# Patient Record
Sex: Female | Born: 1935 | Race: White | Hispanic: No | Marital: Married | Smoking: Never smoker
Health system: Southern US, Community
[De-identification: ages and names within clinical notes are randomized; demographics above are authoritative.]

## PROBLEM LIST (undated history)

## (undated) DIAGNOSIS — C4491 Basal cell carcinoma of skin, unspecified: Secondary | ICD-10-CM

## (undated) DIAGNOSIS — E785 Hyperlipidemia, unspecified: Secondary | ICD-10-CM

## (undated) DIAGNOSIS — C4492 Squamous cell carcinoma of skin, unspecified: Secondary | ICD-10-CM

## (undated) HISTORY — PX: ORIF TIBIA PLATEAU: SHX2132

## (undated) HISTORY — PX: KNEE SURGERY: SHX244

---
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
## 1962-10-04 DIAGNOSIS — C4491 Basal cell carcinoma of skin, unspecified: Secondary | ICD-10-CM

## 1962-10-04 HISTORY — DX: Basal cell carcinoma of skin, unspecified: C44.91

## 1997-12-07 ENCOUNTER — Other Ambulatory Visit: Admission: RE | Admit: 1997-12-07 | Discharge: 1997-12-07 | Payer: Self-pay | Admitting: Obstetrics & Gynecology

## 1998-12-14 ENCOUNTER — Other Ambulatory Visit: Admission: RE | Admit: 1998-12-14 | Discharge: 1998-12-14 | Payer: Self-pay | Admitting: Obstetrics & Gynecology

## 2000-02-14 ENCOUNTER — Other Ambulatory Visit: Admission: RE | Admit: 2000-02-14 | Discharge: 2000-02-14 | Payer: Self-pay | Admitting: Obstetrics & Gynecology

## 2000-08-11 DIAGNOSIS — C4492 Squamous cell carcinoma of skin, unspecified: Secondary | ICD-10-CM

## 2000-08-11 HISTORY — DX: Squamous cell carcinoma of skin, unspecified: C44.92

## 2001-08-12 ENCOUNTER — Other Ambulatory Visit: Admission: RE | Admit: 2001-08-12 | Discharge: 2001-08-12 | Payer: Self-pay | Admitting: Obstetrics & Gynecology

## 2002-08-24 ENCOUNTER — Other Ambulatory Visit: Admission: RE | Admit: 2002-08-24 | Discharge: 2002-08-24 | Payer: Self-pay | Admitting: Obstetrics & Gynecology

## 2003-08-31 ENCOUNTER — Other Ambulatory Visit: Admission: RE | Admit: 2003-08-31 | Discharge: 2003-08-31 | Payer: Self-pay | Admitting: Obstetrics & Gynecology

## 2011-09-26 ENCOUNTER — Telehealth: Payer: Self-pay

## 2011-09-26 NOTE — Telephone Encounter (Signed)
Pt called to schedule a colonoscopy. Said she is a patient of Dr. Sherril Croon. In the conversation she asked about Dr. Karilyn Cota. He is the one she wanted to schedule with. I gave her his phone number and she said she will call if needed.

## 2011-10-04 ENCOUNTER — Encounter (INDEPENDENT_AMBULATORY_CARE_PROVIDER_SITE_OTHER): Payer: Self-pay | Admitting: *Deleted

## 2011-10-17 ENCOUNTER — Encounter (INDEPENDENT_AMBULATORY_CARE_PROVIDER_SITE_OTHER): Payer: Self-pay | Admitting: *Deleted

## 2011-10-17 ENCOUNTER — Telehealth (INDEPENDENT_AMBULATORY_CARE_PROVIDER_SITE_OTHER): Payer: Self-pay | Admitting: *Deleted

## 2011-10-17 ENCOUNTER — Other Ambulatory Visit (INDEPENDENT_AMBULATORY_CARE_PROVIDER_SITE_OTHER): Payer: Self-pay | Admitting: *Deleted

## 2011-10-17 DIAGNOSIS — Z1211 Encounter for screening for malignant neoplasm of colon: Secondary | ICD-10-CM

## 2011-10-17 MED ORDER — PEG-KCL-NACL-NASULF-NA ASC-C 100 G PO SOLR: 1.0000 | Freq: Once | ORAL | Status: DC

## 2011-10-17 NOTE — Telephone Encounter (Signed)
Patient needs movi prep 

## 2011-11-21 ENCOUNTER — Other Ambulatory Visit: Payer: Self-pay | Admitting: Obstetrics & Gynecology

## 2011-11-21 DIAGNOSIS — R928 Other abnormal and inconclusive findings on diagnostic imaging of breast: Secondary | ICD-10-CM

## 2011-12-02 ENCOUNTER — Inpatient Hospital Stay: Admission: RE | Admit: 2011-12-02 | Payer: Medicare Other | Source: Ambulatory Visit

## 2011-12-04 ENCOUNTER — Ambulatory Visit
Admission: RE | Admit: 2011-12-04 | Discharge: 2011-12-04 | Disposition: A | Discharge status: Home / Self Care | Payer: Medicare Other | Source: Ambulatory Visit | Attending: Obstetrics & Gynecology | Admitting: Obstetrics & Gynecology

## 2011-12-04 DIAGNOSIS — R928 Other abnormal and inconclusive findings on diagnostic imaging of breast: Secondary | ICD-10-CM

## 2011-12-04 IMAGING — MG MM DIGITAL DIAGNOSTIC UNILAT*L*
3 series · 3 of 3 positions shown · non-contrast
Comparison: [DATE] and prior mammograms dating back to [J8].

CLINICAL DATA: 75-year-old female with abnormal screening
mammogram - possible left breast distortion and calcifications.

DIGITAL DIAGNOSTIC LEFT MAMMOGRAM  AND LEFT BREAST ULTRASOUND:

[L CC (1 of 2)]
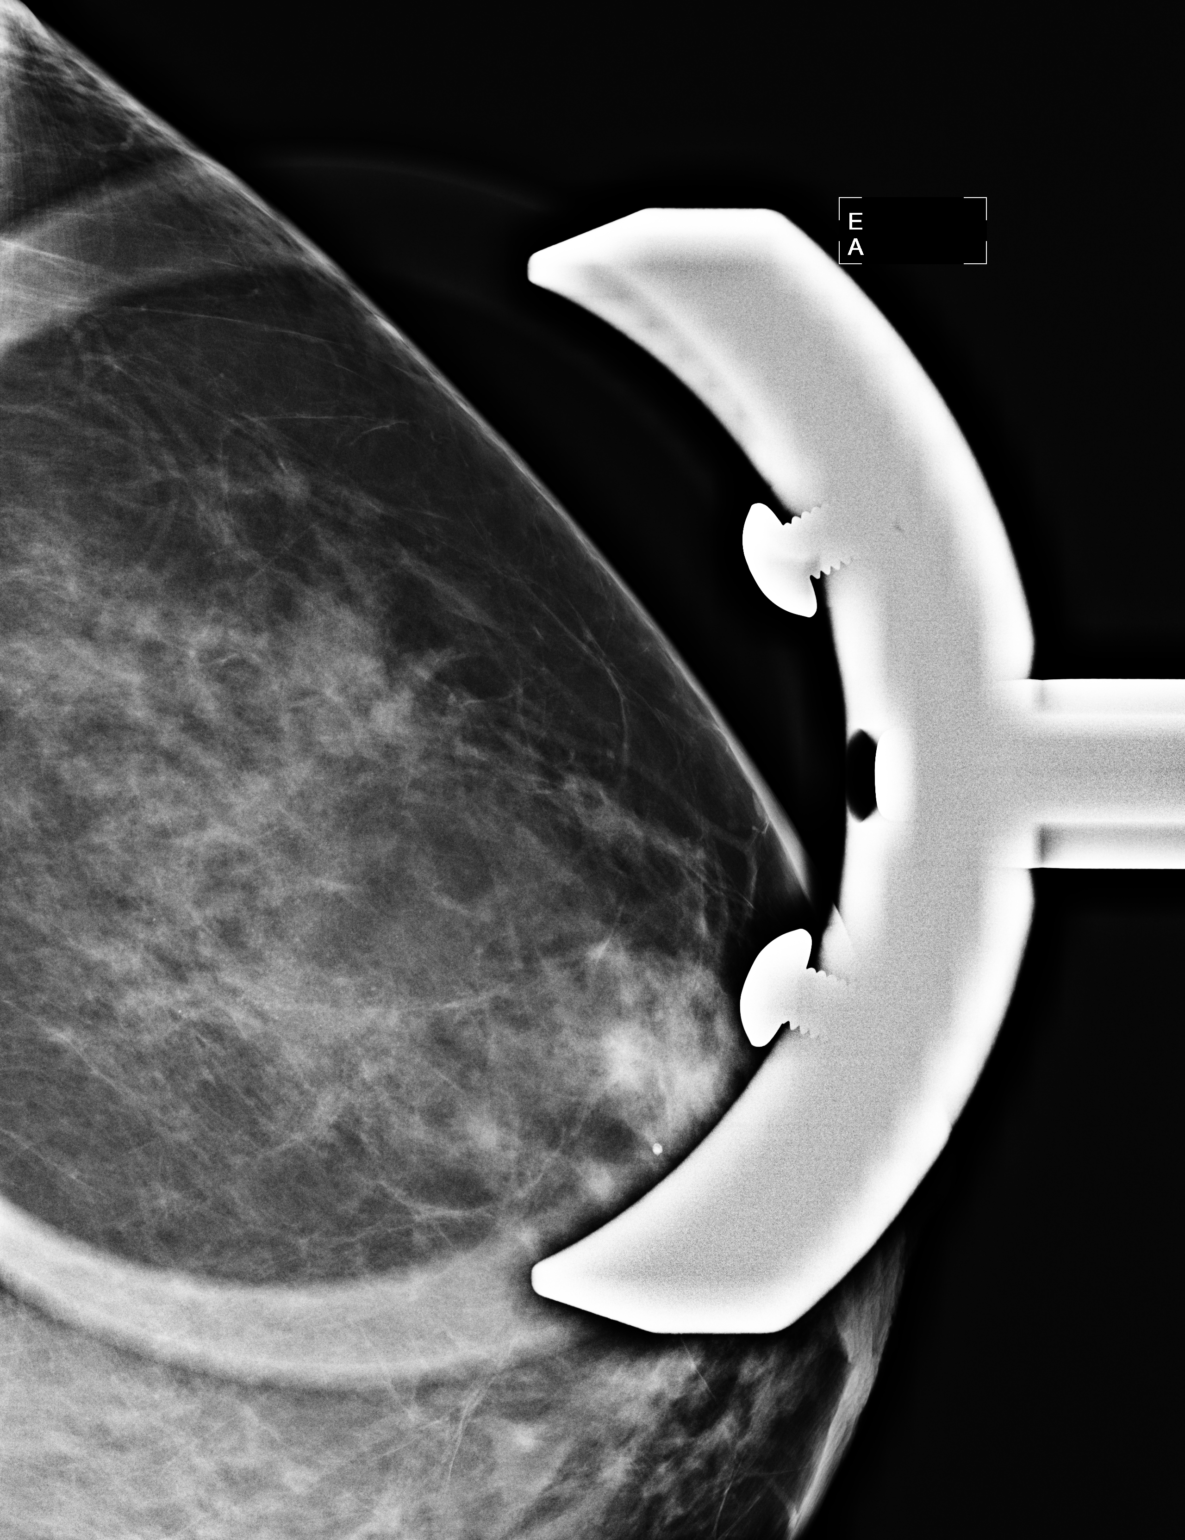

[L ML]
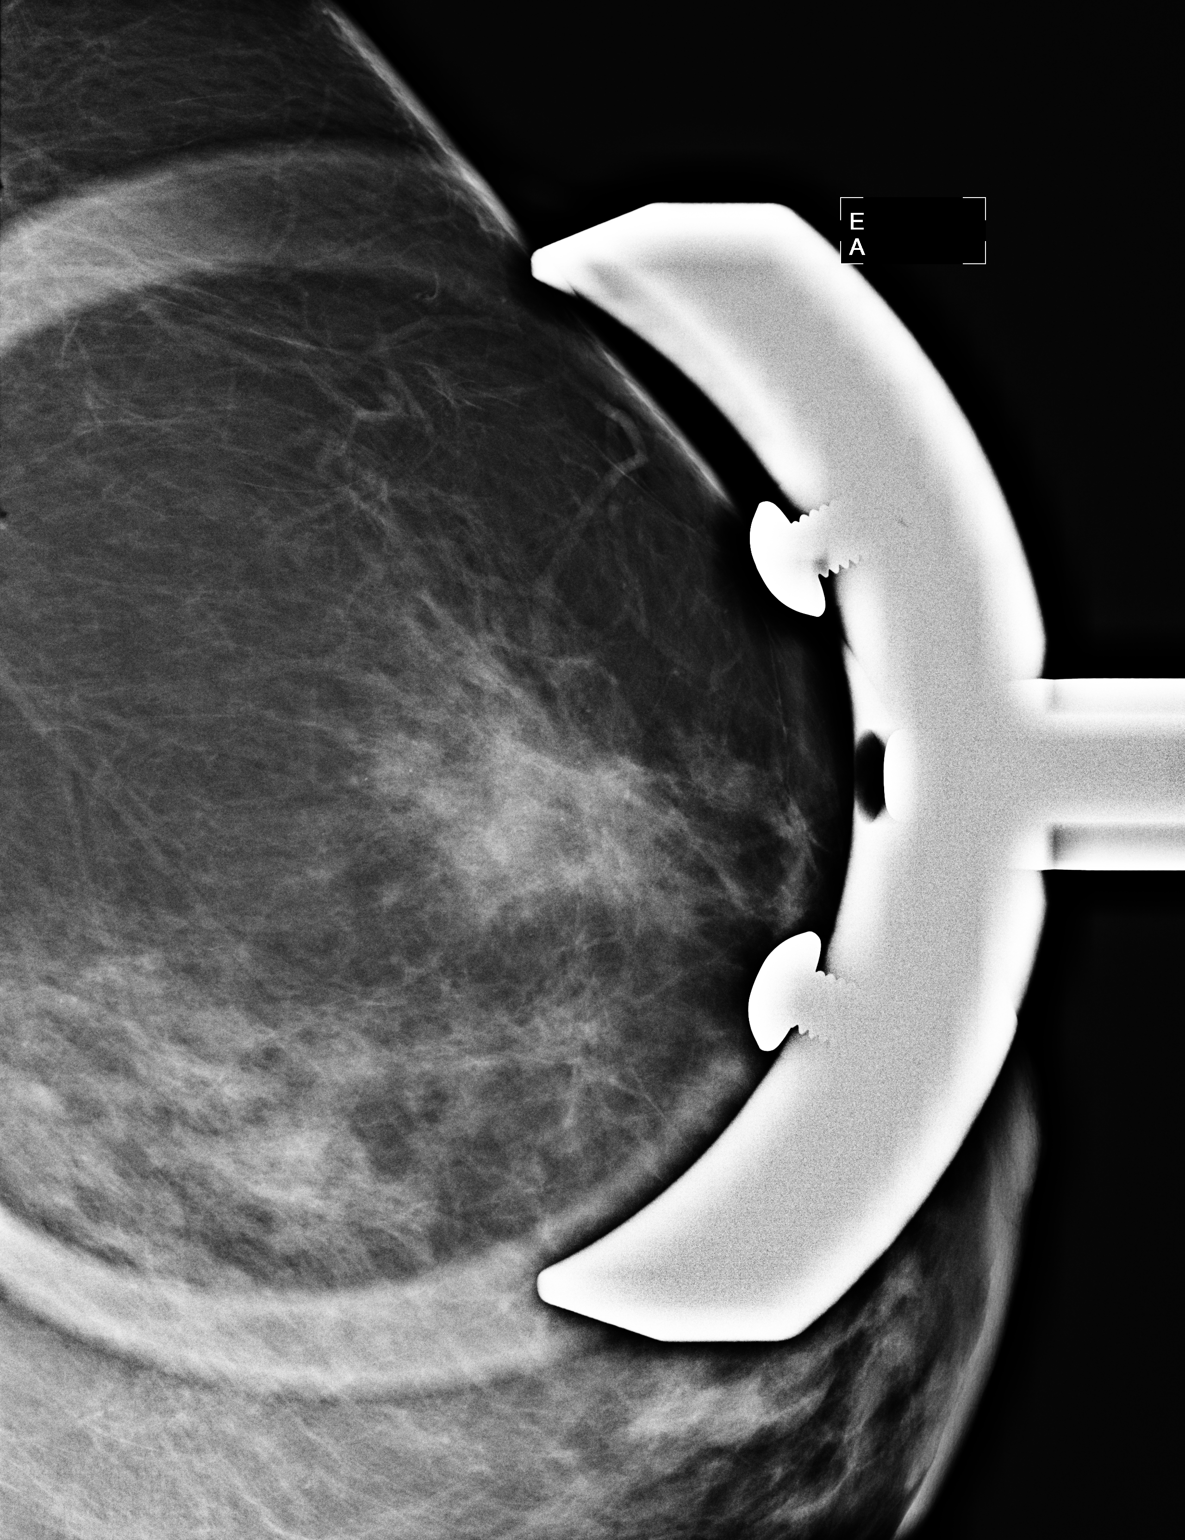

[L CC (2 of 2)]
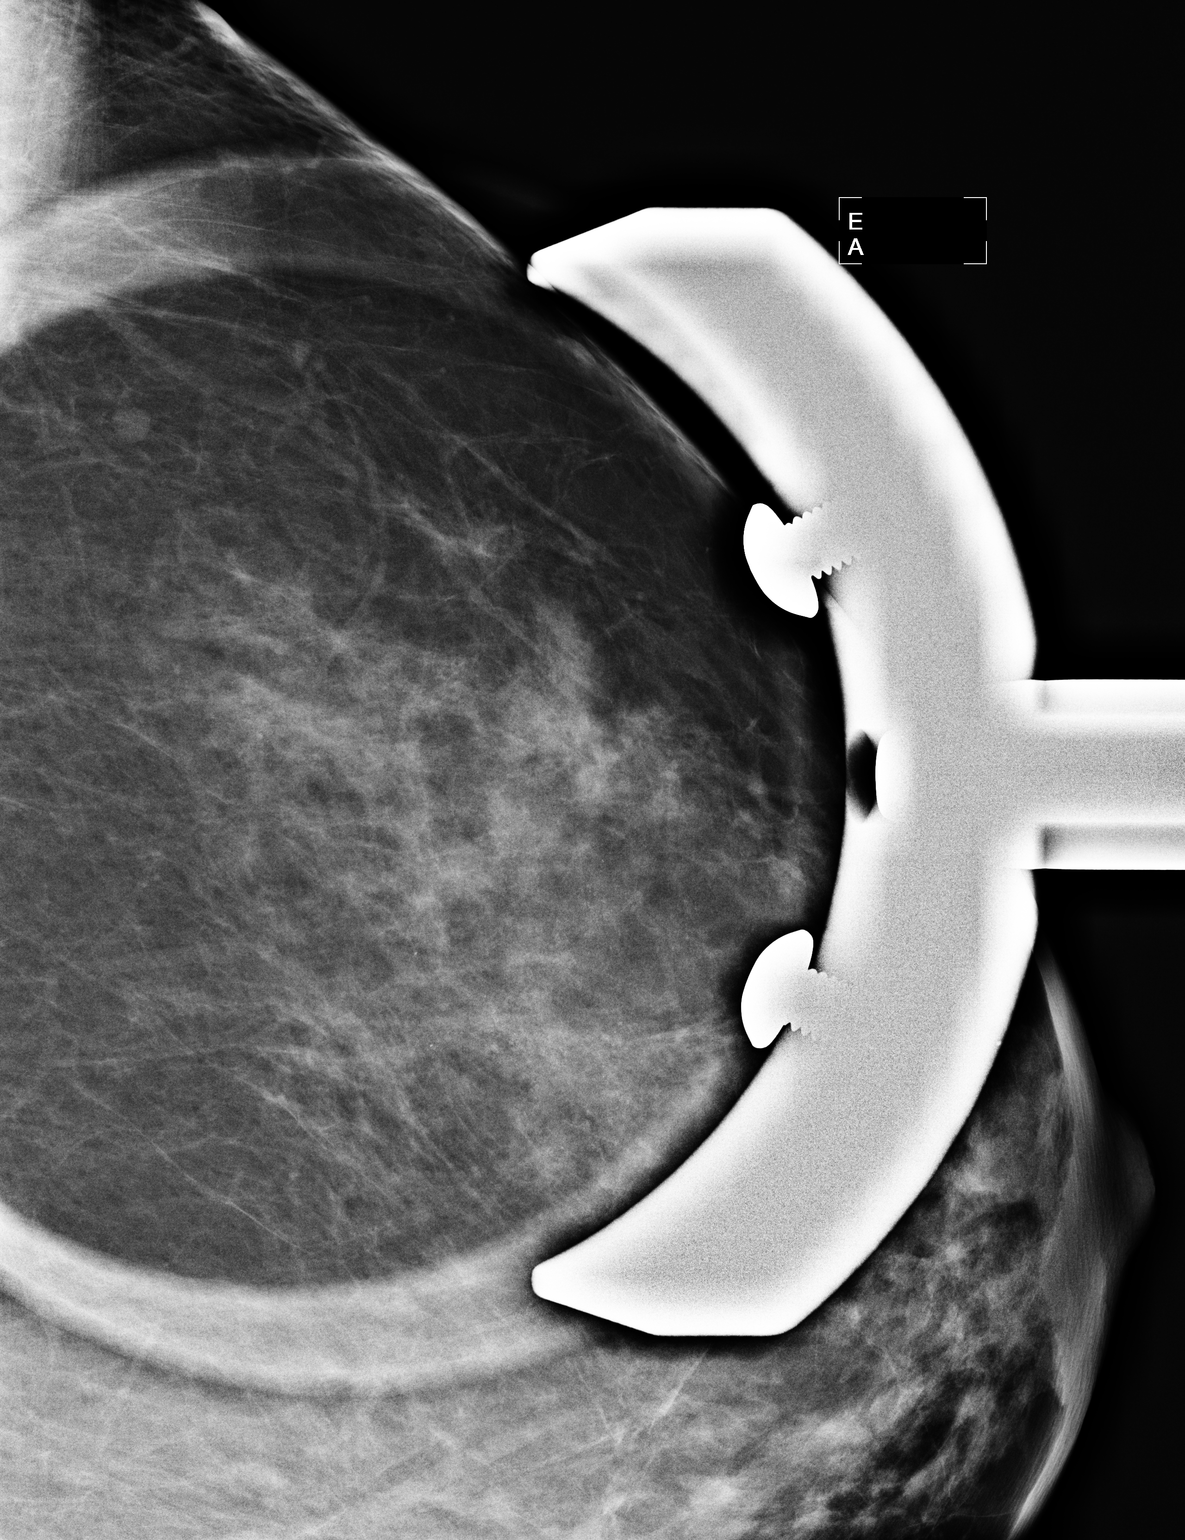

[3 of 3 positions shown; findings below may reference images not displayed]

FINDINGS: CC and MLO magnification views of the upper and outer
left breast demonstrate a focal asymmetric density with scattered
faint calcifications, which on today's study has a similar
appearance to [DATE] images.  There is no evidence of discrete
mass, persistent architectural distortion or new calcifications.

On physical exam, no palpable abnormalities identified in the upper
left breast.

Ultrasound is performed, showing no evidence of solid or cystic
mass, distortion or worrisome shadowing in the upper left breast.
IMPRESSION: Improved appearance of focal asymmetry within the upper left breast
when compared to recent screening study.  This focal asymmetry and
calcifications have a similar appearance to [J8] and compatible
with a benign process.

These findings were discussed with the patient and her questions
answered.  She was encouraged to begin/continue monthly self exams
and to contact her primary physician if any changes noted.

BI-RADS CATEGORY 2:  Benign finding(s).

RECOMMENDATION:
Recommend bilateral screening mammograms in [DATE].

## 2011-12-16 ENCOUNTER — Telehealth (INDEPENDENT_AMBULATORY_CARE_PROVIDER_SITE_OTHER): Payer: Self-pay | Admitting: *Deleted

## 2011-12-16 ENCOUNTER — Encounter (HOSPITAL_COMMUNITY): Payer: Self-pay | Admitting: Pharmacy Technician

## 2011-12-16 NOTE — Telephone Encounter (Signed)
PCP/Requesting MD: vyas  Name & DOB: Shelby Hill [birth year]     Procedure: tcs  Reason/Indication:  screeniing  Has patient had this procedure before?  no  If so, when, by whom and where?    Is there a family history of colon cancer?  no  Who?  What age when diagnosed?    Is patient diabetic?   no      Does patient have prosthetic heart valve?  no  Do you have a pacemaker?  no  Has patient had joint replacement within last 12 months?  no  Is patient on Coumadin, Plavix and/or Aspirin? no  Medications: tylenol prn, advil prn, nasal spray prn  Allergies: nkda  Medication Adjustment:   Procedure date & time: 01/01/12 at 830

## 2011-12-16 NOTE — Telephone Encounter (Signed)
Agree 

## 2011-12-31 MED ORDER — SODIUM CHLORIDE 0.45 % IV SOLN
Freq: Once | INTRAVENOUS | Status: AC
  Administered 2012-01-01: 08:00:00 via INTRAVENOUS

## 2012-01-01 ENCOUNTER — Ambulatory Visit (HOSPITAL_COMMUNITY)
Admission: RE | Admit: 2012-01-01 | Discharge: 2012-01-01 | Disposition: A | Discharge status: Home / Self Care | Payer: Medicare Other | Source: Ambulatory Visit | Attending: Internal Medicine | Admitting: Internal Medicine

## 2012-01-01 ENCOUNTER — Encounter (HOSPITAL_COMMUNITY): Payer: Self-pay | Admitting: *Deleted

## 2012-01-01 ENCOUNTER — Encounter (HOSPITAL_COMMUNITY)
Admission: RE | Disposition: A | Discharge status: Home / Self Care | Payer: Self-pay | Source: Ambulatory Visit | Attending: Internal Medicine

## 2012-01-01 DIAGNOSIS — K644 Residual hemorrhoidal skin tags: Secondary | ICD-10-CM

## 2012-01-01 DIAGNOSIS — D126 Benign neoplasm of colon, unspecified: Secondary | ICD-10-CM

## 2012-01-01 DIAGNOSIS — K5730 Diverticulosis of large intestine without perforation or abscess without bleeding: Secondary | ICD-10-CM | POA: Insufficient documentation

## 2012-01-01 DIAGNOSIS — Z1211 Encounter for screening for malignant neoplasm of colon: Secondary | ICD-10-CM

## 2012-01-01 DIAGNOSIS — K5732 Diverticulitis of large intestine without perforation or abscess without bleeding: Secondary | ICD-10-CM

## 2012-01-01 HISTORY — PX: COLONOSCOPY: SHX5424

## 2012-01-01 SURGERY — COLONOSCOPY
Anesthesia: Moderate Sedation

## 2012-01-01 MED ORDER — MEPERIDINE HCL 50 MG/ML IJ SOLN
INTRAMUSCULAR | Status: AC
  Filled 2012-01-01: qty 1

## 2012-01-01 MED ORDER — MIDAZOLAM HCL 5 MG/5ML IJ SOLN
INTRAMUSCULAR | Status: DC | PRN
  Administered 2012-01-01 (×5): 2 mg via INTRAVENOUS

## 2012-01-01 MED ORDER — MIDAZOLAM HCL 5 MG/5ML IJ SOLN
INTRAMUSCULAR | Status: AC
  Filled 2012-01-01: qty 10

## 2012-01-01 MED ORDER — STERILE WATER FOR IRRIGATION IR SOLN
Status: DC | PRN
  Administered 2012-01-01: 08:00:00

## 2012-01-01 MED ORDER — METRONIDAZOLE 500 MG PO TABS: 500.0000 mg | ORAL_TABLET | Freq: Three times a day (TID) | ORAL | Status: AC

## 2012-01-01 MED ORDER — MEPERIDINE HCL 50 MG/ML IJ SOLN
INTRAMUSCULAR | Status: DC | PRN
  Administered 2012-01-01 (×2): 25 mg via INTRAVENOUS

## 2012-01-01 MED ORDER — CIPROFLOXACIN HCL 500 MG PO TABS: 500.0000 mg | ORAL_TABLET | Freq: Two times a day (BID) | ORAL | Status: AC

## 2012-01-01 NOTE — H&P (Signed)
Shelby Hill is an 76 y.o. female.   Chief Complaint: Patient is here for colonoscopy. HPI: Patient is 76 year old Caucasian female who is here for screening colonoscopy. This is patient's first exam. She denies abdominal pain change in bowel habits or rectal bleeding. Family history is negative for colorectal carcinoma.  History reviewed. No pertinent past medical history.  Past Surgical History  Procedure Date  . Orif tibia plateau     History reviewed. No pertinent family history. Social History:  reports that she has never smoked. She does not have any smokeless tobacco history on file. She reports that she does not drink alcohol or use illicit drugs.  Allergies:  Allergies  Allergen Reactions  . Codeine Nausea And Vomiting    Medications Prior to Admission  Medication Sig Dispense Refill  . Multiple Vitamin (MULTIVITAMIN WITH MINERALS) TABS Take 1 tablet by mouth daily.      . peg 3350 powder (MOVIPREP) 100 G SOLR Take 1 kit (100 g total) by mouth once.  1 kit  0    No results found for this or any previous visit (from the past 48 hour(s)). No results found.  ROS  Blood pressure 139/78, pulse 93, temperature 97.9 F (36.6 C), temperature source Oral, resp. rate 18, height 5\' 6"  (1.676 m), weight 160 lb (72.576 kg), SpO2 97.00%. Physical Exam  Constitutional: She appears well-developed and well-nourished.  HENT:  Mouth/Throat: Oropharynx is clear and moist.  Eyes: Conjunctivae are normal. No scleral icterus.  Neck: No thyromegaly present.  Cardiovascular: Normal rate, regular rhythm and normal heart sounds.   No murmur heard. Respiratory: Effort normal.  GI: Soft. She exhibits no distension and no mass. There is no tenderness.  Musculoskeletal: She exhibits no edema.  Lymphadenopathy:    She has no cervical adenopathy.  Neurological: She is alert.  Skin: Skin is warm and dry.     Assessment/Plan Average risk screening colonoscopy.  Shelby Hill U 01/01/2012,  8:29 AM

## 2012-01-01 NOTE — Op Note (Signed)
COLONOSCOPY PROCEDURE REPORT  PATIENT:  Shelby Hill  MR#:  [id number] Birthdate:  15-Sep-1935, 76 y.o., female Endoscopist:  Dr. Malissa Hippo, MD Referred By:  Dr. Ignatius Specking, MD Procedure Date: 01/01/2012  Procedure:   Colonoscopy  Indications:  Patient is 76 year old Caucasian female who is undergoing average risk screening colonoscopy. This is patient's first exam.  Informed Consent:  The procedure and risks were reviewed with the patient and informed consent was obtained.  Medications:  Demerol 50 mg IV Versed 10 mg IV  Description of procedure:  After a digital rectal exam was performed, that colonoscope was advanced from the anus through the rectum and colon to the area of the cecum, ileocecal valve and appendiceal orifice. The cecum was deeply intubated. These structures were well-seen and photographed for the record. From the level of the cecum and ileocecal valve, the scope was slowly and cautiously withdrawn. The mucosal surfaces were carefully surveyed utilizing scope tip to flexion to facilitate fold flattening as needed. The scope was pulled down into the rectum where a thorough exam including retroflexion was performed.  Findings:   Prep excellent. Scattered diverticula at sigmoid colon and one with stomal ulceration edema and exudate. Two small polyps ablated via cold biopsy and submitted together(cecum and hepatic flexure). Small polyps from sigmoid colon also ablated via cold biopsy. Small  hemorrhoids below the dentate line.  Therapeutic/Diagnostic Maneuvers Performed:  See above  Complications:  None  Cecal Withdrawal Time:  12 minutes  Impression:  Examination performed to cecum. Sigmoid diverticulosis and one diverticulum with changes of diverticulitis. Three small polyps ablated via cold biopsy. 2 of these are submitted together(common hepatic flexure). Third polyp was at sigmoid colon. Small external hemorrhoids.  Recommendations:  Standard  instructions. Cipro 500 mg by mouth twice a day for 10 days. Metronidazole 500 mg by mouth twice a day for 10 days. I will be contacting patient with biopsy results and further recommendations.  Carlin Mamone U  01/01/2012 9:18 AM  CC: Dr. Ignatius Specking., MD & Dr. Bonnetta Barry ref. provider found

## 2012-01-03 ENCOUNTER — Encounter (HOSPITAL_COMMUNITY): Payer: Self-pay | Admitting: Internal Medicine

## 2012-01-10 ENCOUNTER — Encounter (INDEPENDENT_AMBULATORY_CARE_PROVIDER_SITE_OTHER): Payer: Self-pay | Admitting: *Deleted

## 2012-02-06 ENCOUNTER — Other Ambulatory Visit: Payer: Self-pay | Admitting: Dermatology

## 2012-11-09 ENCOUNTER — Other Ambulatory Visit: Payer: Self-pay | Admitting: Dermatology

## 2013-12-14 ENCOUNTER — Other Ambulatory Visit: Payer: Self-pay | Admitting: Dermatology

## 2014-01-21 ENCOUNTER — Other Ambulatory Visit: Payer: Self-pay

## 2014-01-21 DIAGNOSIS — Z1231 Encounter for screening mammogram for malignant neoplasm of breast: Secondary | ICD-10-CM

## 2014-02-04 ENCOUNTER — Encounter (INDEPENDENT_AMBULATORY_CARE_PROVIDER_SITE_OTHER): Payer: Self-pay

## 2014-02-04 ENCOUNTER — Ambulatory Visit
Admission: RE | Admit: 2014-02-04 | Discharge: 2014-02-04 | Disposition: A | Discharge status: Home / Self Care | Payer: 59 | Source: Ambulatory Visit

## 2014-02-04 DIAGNOSIS — Z1231 Encounter for screening mammogram for malignant neoplasm of breast: Secondary | ICD-10-CM

## 2014-08-04 ENCOUNTER — Other Ambulatory Visit: Payer: Self-pay | Admitting: Dermatology

## 2015-08-09 ENCOUNTER — Other Ambulatory Visit: Payer: Self-pay | Admitting: Dermatology

## 2016-01-09 ENCOUNTER — Other Ambulatory Visit: Payer: Self-pay | Admitting: Internal Medicine

## 2016-01-09 DIAGNOSIS — Z1231 Encounter for screening mammogram for malignant neoplasm of breast: Secondary | ICD-10-CM

## 2016-01-15 ENCOUNTER — Ambulatory Visit
Admission: RE | Admit: 2016-01-15 | Discharge: 2016-01-15 | Disposition: A | Discharge status: Home / Self Care | Payer: Medicare Other | Source: Ambulatory Visit | Attending: Internal Medicine | Admitting: Internal Medicine

## 2016-01-15 ENCOUNTER — Ambulatory Visit: Payer: Self-pay

## 2016-01-15 DIAGNOSIS — Z1231 Encounter for screening mammogram for malignant neoplasm of breast: Secondary | ICD-10-CM

## 2016-01-17 ENCOUNTER — Ambulatory Visit: Payer: Self-pay

## 2016-02-28 ENCOUNTER — Other Ambulatory Visit: Payer: Self-pay | Admitting: Dermatology

## 2016-07-01 ENCOUNTER — Other Ambulatory Visit: Payer: Self-pay | Admitting: Dermatology

## 2017-02-27 ENCOUNTER — Other Ambulatory Visit: Payer: Self-pay | Admitting: Internal Medicine

## 2017-02-27 DIAGNOSIS — Z1231 Encounter for screening mammogram for malignant neoplasm of breast: Secondary | ICD-10-CM

## 2017-03-11 ENCOUNTER — Ambulatory Visit
Admission: RE | Admit: 2017-03-11 | Discharge: 2017-03-11 | Disposition: A | Discharge status: Home / Self Care | Payer: Medicare Other | Source: Ambulatory Visit | Attending: Internal Medicine | Admitting: Internal Medicine

## 2017-03-11 DIAGNOSIS — Z1231 Encounter for screening mammogram for malignant neoplasm of breast: Secondary | ICD-10-CM

## 2017-04-25 ENCOUNTER — Encounter (HOSPITAL_COMMUNITY): Payer: Self-pay | Admitting: Internal Medicine

## 2017-04-25 ENCOUNTER — Inpatient Hospital Stay (HOSPITAL_COMMUNITY)
Admission: AD | Admit: 2017-04-25 | Discharge: 2017-04-29 | DRG: 481 | Disposition: A | Discharge status: Skilled Nursing Facility | Payer: Medicare Other | Source: Other Acute Inpatient Hospital | Attending: Family Medicine | Admitting: Family Medicine

## 2017-04-25 DIAGNOSIS — Z419 Encounter for procedure for purposes other than remedying health state, unspecified: Secondary | ICD-10-CM | POA: Diagnosis not present

## 2017-04-25 DIAGNOSIS — Z79899 Other long term (current) drug therapy: Secondary | ICD-10-CM

## 2017-04-25 DIAGNOSIS — E7800 Pure hypercholesterolemia, unspecified: Secondary | ICD-10-CM | POA: Diagnosis not present

## 2017-04-25 DIAGNOSIS — S72002A Fracture of unspecified part of neck of left femur, initial encounter for closed fracture: Secondary | ICD-10-CM

## 2017-04-25 DIAGNOSIS — E876 Hypokalemia: Secondary | ICD-10-CM | POA: Diagnosis not present

## 2017-04-25 DIAGNOSIS — F419 Anxiety disorder, unspecified: Secondary | ICD-10-CM | POA: Diagnosis present

## 2017-04-25 DIAGNOSIS — S72142A Displaced intertrochanteric fracture of left femur, initial encounter for closed fracture: Secondary | ICD-10-CM | POA: Diagnosis not present

## 2017-04-25 DIAGNOSIS — W010XXA Fall on same level from slipping, tripping and stumbling without subsequent striking against object, initial encounter: Secondary | ICD-10-CM | POA: Diagnosis not present

## 2017-04-25 DIAGNOSIS — D62 Acute posthemorrhagic anemia: Secondary | ICD-10-CM | POA: Diagnosis not present

## 2017-04-25 DIAGNOSIS — R262 Difficulty in walking, not elsewhere classified: Secondary | ICD-10-CM | POA: Diagnosis not present

## 2017-04-25 DIAGNOSIS — E785 Hyperlipidemia, unspecified: Secondary | ICD-10-CM | POA: Diagnosis present

## 2017-04-25 DIAGNOSIS — D500 Iron deficiency anemia secondary to blood loss (chronic): Secondary | ICD-10-CM | POA: Diagnosis present

## 2017-04-25 DIAGNOSIS — H00019 Hordeolum externum unspecified eye, unspecified eyelid: Secondary | ICD-10-CM | POA: Diagnosis present

## 2017-04-25 HISTORY — DX: Hyperlipidemia, unspecified: E78.5

## 2017-04-25 LAB — COMPREHENSIVE METABOLIC PANEL
ALK PHOS: 59 U/L (ref 38–126)
ALT: 14 U/L (ref 14–54)
ANION GAP: 5 (ref 5–15)
AST: 18 U/L (ref 15–41)
Albumin: 3.3 g/dL — ABNORMAL LOW (ref 3.5–5.0)
BILIRUBIN TOTAL: 1 mg/dL (ref 0.3–1.2)
BUN: 15 mg/dL (ref 6–20)
CALCIUM: 8.8 mg/dL — AB (ref 8.9–10.3)
CO2: 26 mmol/L (ref 22–32)
CREATININE: 0.68 mg/dL (ref 0.44–1.00)
Chloride: 107 mmol/L (ref 101–111)
Glucose, Bld: 142 mg/dL — ABNORMAL HIGH (ref 65–99)
Potassium: 4 mmol/L (ref 3.5–5.1)
SODIUM: 138 mmol/L (ref 135–145)
TOTAL PROTEIN: 6.1 g/dL — AB (ref 6.5–8.1)

## 2017-04-25 LAB — CBC WITH DIFFERENTIAL/PLATELET
BASOS ABS: 0 10*3/uL (ref 0.0–0.1)
BASOS PCT: 0 %
EOS ABS: 0 10*3/uL (ref 0.0–0.7)
Eosinophils Relative: 0 %
HCT: 35.3 % — ABNORMAL LOW (ref 36.0–46.0)
HEMOGLOBIN: 11.7 g/dL — AB (ref 12.0–15.0)
Lymphocytes Relative: 8 %
Lymphs Abs: 0.8 10*3/uL (ref 0.7–4.0)
MCH: 29.6 pg (ref 26.0–34.0)
MCHC: 33.1 g/dL (ref 30.0–36.0)
MCV: 89.4 fL (ref 78.0–100.0)
MONOS PCT: 6 %
Monocytes Absolute: 0.6 10*3/uL (ref 0.1–1.0)
NEUTROS ABS: 9 10*3/uL — AB (ref 1.7–7.7)
NEUTROS PCT: 86 %
Platelets: 176 10*3/uL (ref 150–400)
RBC: 3.95 MIL/uL (ref 3.87–5.11)
RDW: 13.4 % (ref 11.5–15.5)
WBC: 10.5 10*3/uL (ref 4.0–10.5)

## 2017-04-25 LAB — TYPE AND SCREEN
ABO/RH(D): O POS
ANTIBODY SCREEN: NEGATIVE

## 2017-04-25 LAB — ABO/RH: ABO/RH(D): O POS

## 2017-04-25 MED ORDER — MORPHINE SULFATE (PF) 4 MG/ML IV SOLN
1.0000 mg | INTRAVENOUS | Status: DC | PRN
  Administered 2017-04-25 – 2017-04-26 (×3): 1 mg via INTRAVENOUS
  Filled 2017-04-25 (×3): qty 1

## 2017-04-25 MED ORDER — ZOLPIDEM TARTRATE 5 MG PO TABS
5.0000 mg | ORAL_TABLET | Freq: Once | ORAL | Status: AC
  Administered 2017-04-25: 5 mg via ORAL
  Filled 2017-04-25 (×2): qty 1

## 2017-04-25 MED ORDER — METHOCARBAMOL 1000 MG/10ML IJ SOLN
500.0000 mg | Freq: Four times a day (QID) | INTRAMUSCULAR | Status: DC | PRN
  Filled 2017-04-25: qty 5

## 2017-04-25 MED ORDER — METHOCARBAMOL 500 MG PO TABS
500.0000 mg | ORAL_TABLET | Freq: Four times a day (QID) | ORAL | Status: DC | PRN
  Administered 2017-04-25 – 2017-04-28 (×6): 500 mg via ORAL
  Filled 2017-04-25 (×3): qty 1

## 2017-04-25 MED ORDER — MORPHINE SULFATE (PF) 4 MG/ML IV SOLN
0.5000 mg | INTRAVENOUS | Status: DC | PRN
  Administered 2017-04-25: 0.52 mg via INTRAVENOUS
  Filled 2017-04-25: qty 1

## 2017-04-25 NOTE — Consult Note (Signed)
   ORTHOPAEDIC CONSULTATION  REQUESTING PHYSICIAN: Modena Jansky, MD  Chief Complaint: Left intertroch hip fracture  HPI: Shelby Hill is a 81 y.o. female who presents with left hip fracture s/p mechanical fall PTA.  The patient endorses severe pain in the left hip, that does not radiate, grinding in quality, worse with any movement, better with immobilization.  Denies LOC/fever/chills/nausea/vomiting.  Walks without assistive devices (walker, cane, wheelchair).  Does live independently.  Denies LOC, neck pain, abd pain.  No past medical history on file.  Social History   Socioeconomic History  . Marital status: Married    Spouse name: Not on file  . Number of children: Not on file  . Years of education: Not on file  . Highest education level: Not on file  Social Needs  . Financial resource strain: Not on file  . Food insecurity - worry: Not on file  . Food insecurity - inability: Not on file  . Transportation needs - medical: Not on file  . Transportation needs - non-medical: Not on file  Occupational History  . Not on file  Tobacco Use  . Smoking status: Not on file  Substance and Sexual Activity  . Alcohol use: Not on file  . Drug use: Not on file  . Sexual activity: Not on file  Other Topics Concern  . Not on file  Social History Narrative  . Not on file   No family history on file. No Known Allergies Prior to Admission medications   Medication Sig Start Date End Date Taking? Authorizing Provider  acetaminophen (TYLENOL) 500 MG tablet Take 500 mg by mouth every 6 (six) hours as needed for headache (pain).   Yes [provider]  ALPRAZolam Duanne Moron) 0.5 MG tablet Take 0.25 mg by mouth at bedtime as needed for anxiety or sleep.   Yes [provider]  neomycin-polymyxin-dexameth (MAXITROL) 0.1 % OINT Place 1 application into both eyes See admin instructions. Apply to each eyelid daily at bedtime until sties are gone   Yes [provider]    PRAVASTATIN SODIUM PO Take 1 tablet by mouth daily.   Yes [provider]  PRESCRIPTION MEDICATION Take 1 tablet by mouth 2 (two) times daily. Antibiotic for eye infection - started approximately 04/21/17   Yes [provider]   No results found.  All pertinent xrays, MRI, CT independently reviewed and interpreted  Positive ROS: All other systems have been reviewed and were otherwise negative with the exception of those mentioned in the HPI and as above.  Physical Exam: General: Alert, no acute distress Cardiovascular: No pedal edema Respiratory: No cyanosis, no use of accessory musculature GI: No organomegaly, abdomen is soft and non-tender Skin: No lesions in the area of chief complaint Neurologic: Sensation intact distally Psychiatric: Patient is competent for consent with normal mood and affect Lymphatic: No axillary or cervical lymphadenopathy  MUSCULOSKELETAL:  - pain with movement of the hip and extremity - skin intact - NVI distally - compartments soft  Assessment: Left intertroch hip fracture  Plan: - operative fixation is recommended, patient and family are aware of r/b/a and wish to proceed - consent obtained - medical optimization per primary team - surgery is planned for sat morning - NPO after midnight    Thank you for the consult and the opportunity to see Shelby Hill  N. Eduard Roux, MD [place] 9:14 PM

## 2017-04-25 NOTE — H&P (Signed)
History and Physical    Ceriah Hill Hill DOB: [birth year] DOA: 04/25/2017  PCP: No primary care provider on file.  Patient coming from: Patient was transferred from [facility].  Chief Complaint: Fall.  HPI: Shelby Hill is a 81 y.o. female with history of hyperlipidemia and anxiety had a fall at home after tripping.  Denies hitting her head or losing consciousness.  Denies any chest pain or shortness of breath prior to the fall or after the fall.  ED Course: Patient was taken to the ED in [place] at [facility].  X-rays revealed left intertrochanteric fracture.  Dr. Erlinda Hong on-call orthopedic surgeon at [facility] was consulted and patient transferred over here for surgery.  At the time of my exam patient is not in distress.  I have reviewed patient's labs and x-rays results obtained from [facility].  Review of Systems: As per HPI, rest all negative.   Past Medical History:  Diagnosis Date  . HLD (hyperlipidemia)     Past Surgical History:  Procedure Laterality Date  . KNEE SURGERY       reports that  has never smoked. she has never used smokeless tobacco. She reports that she drinks alcohol. She reports that she does not use drugs.  No Known Allergies  Family History  Problem Relation Age of Onset  . CAD Mother   . Stroke Sister   . Stroke Brother     Prior to Admission medications   Medication Sig Start Date End Date Taking? Authorizing Provider  acetaminophen (TYLENOL) 500 MG tablet Take 500 mg by mouth every 6 (six) hours as needed for headache (pain).   Yes [provider]  ALPRAZolam Duanne Moron) 0.5 MG tablet Take 0.25 mg by mouth at bedtime as needed for anxiety or sleep.   Yes [provider]  neomycin-polymyxin-dexameth (MAXITROL) 0.1 % OINT Place 1 application into both eyes See admin instructions. Apply to each eyelid daily at bedtime until sties are gone   Yes [provider]  PRAVASTATIN SODIUM PO Take 1 tablet by mouth  daily.   Yes [provider]  PRESCRIPTION MEDICATION Take 1 tablet by mouth 2 (two) times daily. Antibiotic for eye infection - started approximately 04/21/17   Yes [provider]    Physical Exam: Vitals:   04/25/17 1955  BP: 135/78  Pulse: 74  Resp: 18  Temp: 97.8 F (36.6 C)  TempSrc: Oral  SpO2: 100%      Constitutional: Moderately built and nourished. Vitals:   04/25/17 1955  BP: 135/78  Pulse: 74  Resp: 18  Temp: 97.8 F (36.6 C)  TempSrc: Oral  SpO2: 100%   Eyes: Anicteric no pallor. ENMT: No discharge from the ears eyes nose or mouth. Neck: No mass felt.  No neck rigidity. Respiratory: No rhonchi or crepitations. Cardiovascular: S1-S2 heard. Abdomen: Soft nontender bowel sounds present. Musculoskeletal: Pain on moving left hip. Skin: Appears normal. Neurologic: Alert awake oriented to time place and person.  Moves all extremities. Psychiatric: Appears normal.  Normal affect.   Labs on Admission: I have personally reviewed following labs and imaging studies  CBC: Recent Labs  Lab 04/25/17 2114  WBC 10.5  NEUTROABS 9.0*  HGB 11.7*  HCT 35.3*  MCV 89.4  PLT 381   Basic Metabolic Panel: Recent Labs  Lab 04/25/17 2114  NA 138  K 4.0  CL 107  CO2 26  GLUCOSE 142*  BUN 15  CREATININE 0.68  CALCIUM 8.8*   GFR: CrCl cannot  be calculated (Unknown ideal weight.). Liver Function Tests: Recent Labs  Lab 04/25/17 2114  AST 18  ALT 14  ALKPHOS 59  BILITOT 1.0  PROT 6.1*  ALBUMIN 3.3*   No results for input(s): LIPASE, AMYLASE in the last 168 hours. No results for input(s): AMMONIA in the last 168 hours. Coagulation Profile: No results for input(s): INR, PROTIME in the last 168 hours. Cardiac Enzymes: No results for input(s): CKTOTAL, CKMB, CKMBINDEX, TROPONINI in the last 168 hours. BNP (last 3 results) No results for input(s): PROBNP in the last 8760 hours. HbA1C: No results for input(s): HGBA1C in the last 72  hours. CBG: No results for input(s): GLUCAP in the last 168 hours. Lipid Profile: No results for input(s): CHOL, HDL, LDLCALC, TRIG, CHOLHDL, LDLDIRECT in the last 72 hours. Thyroid Function Tests: No results for input(s): TSH, T4TOTAL, FREET4, T3FREE, THYROIDAB in the last 72 hours. Anemia Panel: No results for input(s): VITAMINB12, FOLATE, FERRITIN, TIBC, IRON, RETICCTPCT in the last 72 hours. Urine analysis: No results found for: COLORURINE, APPEARANCEUR, LABSPEC, PHURINE, GLUCOSEU, HGBUR, BILIRUBINUR, KETONESUR, PROTEINUR, UROBILINOGEN, NITRITE, LEUKOCYTESUR Sepsis Labs: @LABRCNTIP (procalcitonin:4,lacticidven:4) )No results found for this or any previous visit (from the past 240 hour(s)).   Radiological Exams on Admission: No results found.  EKG: Independently reviewed.  Normal sinus rhythm.  Assessment/Plan Principal Problem:   Closed left hip fracture, initial encounter ([place]) Active Problems:   HLD (hyperlipidemia)    1. Left hip fracture status post mechanical fall -patient will be kept n.p.o. after midnight in anticipation of surgery.  Patient is at low risk for intermediate risk procedure.  Continue with pain relief medication and muscle relaxants.  Further recommendations per orthopedic surgeon. 2. History of hyperlipidemia -on statins which can be continued after the surgery. 3. History of anxiety on Xanax. 4. Normocytic normochromic anemia likely from blood loss -follow CBC.  Type and screen.   DVT prophylaxis: SCDs. Code Status: Full code. Family Communication: Family at the bedside. Disposition Plan: Home. Consults called: Orthopedics. Admission status: Inpatient.   Rise Patience MD [facility] Pager [phone].  If 7PM-7AM, please contact night-coverage [email or web address] Password [email or web address]  04/25/2017, 10:26 PM

## 2017-04-26 ENCOUNTER — Inpatient Hospital Stay (HOSPITAL_COMMUNITY): Payer: Medicare Other

## 2017-04-26 ENCOUNTER — Inpatient Hospital Stay (HOSPITAL_COMMUNITY): Payer: Medicare Other | Admitting: Certified Registered Nurse Anesthetist

## 2017-04-26 ENCOUNTER — Encounter (HOSPITAL_COMMUNITY)
Admission: AD | Disposition: A | Discharge status: Skilled Nursing Facility | Payer: Self-pay | Source: Other Acute Inpatient Hospital | Attending: Internal Medicine

## 2017-04-26 ENCOUNTER — Other Ambulatory Visit: Payer: Self-pay

## 2017-04-26 DIAGNOSIS — F419 Anxiety disorder, unspecified: Secondary | ICD-10-CM

## 2017-04-26 DIAGNOSIS — S72142A Displaced intertrochanteric fracture of left femur, initial encounter for closed fracture: Secondary | ICD-10-CM

## 2017-04-26 HISTORY — PX: INTRAMEDULLARY (IM) NAIL INTERTROCHANTERIC: SHX5875

## 2017-04-26 LAB — CREATININE, SERUM: Creatinine, Ser: 0.66 mg/dL (ref 0.44–1.00)

## 2017-04-26 LAB — CBC
HEMATOCRIT: 31.6 % — AB (ref 36.0–46.0)
Hemoglobin: 10.4 g/dL — ABNORMAL LOW (ref 12.0–15.0)
MCH: 29.7 pg (ref 26.0–34.0)
MCHC: 32.9 g/dL (ref 30.0–36.0)
MCV: 90.3 fL (ref 78.0–100.0)
PLATELETS: 151 10*3/uL (ref 150–400)
RBC: 3.5 MIL/uL — ABNORMAL LOW (ref 3.87–5.11)
RDW: 13.6 % (ref 11.5–15.5)
WBC: 9.2 10*3/uL (ref 4.0–10.5)

## 2017-04-26 LAB — SURGICAL PCR SCREEN
MRSA, PCR: NEGATIVE
STAPHYLOCOCCUS AUREUS: NEGATIVE

## 2017-04-26 SURGERY — FIXATION, FRACTURE, INTERTROCHANTERIC, WITH INTRAMEDULLARY ROD
Anesthesia: General | Site: Leg Upper | Laterality: Left

## 2017-04-26 MED ORDER — MENTHOL 3 MG MT LOZG: 1.0000 | LOZENGE | OROMUCOSAL | Status: DC | PRN

## 2017-04-26 MED ORDER — ENOXAPARIN SODIUM 40 MG/0.4ML ~~LOC~~ SOLN
40.0000 mg | SUBCUTANEOUS | Status: DC
  Administered 2017-04-27 – 2017-04-29 (×3): 40 mg via SUBCUTANEOUS
  Filled 2017-04-26 (×3): qty 0.4

## 2017-04-26 MED ORDER — ACETAMINOPHEN 325 MG PO TABS: 650.0000 mg | ORAL_TABLET | Freq: Four times a day (QID) | ORAL | Status: DC | PRN

## 2017-04-26 MED ORDER — FENTANYL CITRATE (PF) 250 MCG/5ML IJ SOLN
INTRAMUSCULAR | Status: AC
  Filled 2017-04-26: qty 5

## 2017-04-26 MED ORDER — LIDOCAINE HCL (CARDIAC) 20 MG/ML IV SOLN
INTRAVENOUS | Status: DC | PRN
  Administered 2017-04-26: 60 mg via INTRAVENOUS

## 2017-04-26 MED ORDER — ACETAMINOPHEN 650 MG RE SUPP: 650.0000 mg | Freq: Four times a day (QID) | RECTAL | Status: DC | PRN

## 2017-04-26 MED ORDER — SODIUM CHLORIDE 0.9 % IV SOLN
INTRAVENOUS | Status: DC
  Administered 2017-04-26 – 2017-04-27 (×3): via INTRAVENOUS

## 2017-04-26 MED ORDER — CEFAZOLIN SODIUM-DEXTROSE 2-4 GM/100ML-% IV SOLN
INTRAVENOUS | Status: AC
  Filled 2017-04-26: qty 100

## 2017-04-26 MED ORDER — MORPHINE SULFATE (PF) 2 MG/ML IV SOLN
0.5000 mg | INTRAVENOUS | Status: DC | PRN
  Administered 2017-04-26 – 2017-04-27 (×2): 0.5 mg via INTRAVENOUS
  Filled 2017-04-26 (×3): qty 1

## 2017-04-26 MED ORDER — OXYCODONE-ACETAMINOPHEN 5-325 MG PO TABS: 1.0000 | ORAL_TABLET | ORAL | 0 refills | Status: DC | PRN

## 2017-04-26 MED ORDER — ENOXAPARIN SODIUM 40 MG/0.4ML ~~LOC~~ SOLN: 40.0000 mg | Freq: Every day | SUBCUTANEOUS | 0 refills | Status: DC

## 2017-04-26 MED ORDER — METOCLOPRAMIDE HCL 5 MG PO TABS: 5.0000 mg | ORAL_TABLET | Freq: Three times a day (TID) | ORAL | Status: DC | PRN

## 2017-04-26 MED ORDER — FENTANYL CITRATE (PF) 100 MCG/2ML IJ SOLN
INTRAMUSCULAR | Status: AC
  Administered 2017-04-26: 50 ug via INTRAVENOUS
  Filled 2017-04-26: qty 2

## 2017-04-26 MED ORDER — OXYCODONE HCL 5 MG PO TABS
5.0000 mg | ORAL_TABLET | ORAL | Status: DC | PRN
  Administered 2017-04-26 (×2): 10 mg via ORAL
  Filled 2017-04-26 (×3): qty 2

## 2017-04-26 MED ORDER — 0.9 % SODIUM CHLORIDE (POUR BTL) OPTIME
TOPICAL | Status: DC | PRN
  Administered 2017-04-26: 1000 mL

## 2017-04-26 MED ORDER — MIDAZOLAM HCL 2 MG/2ML IJ SOLN
INTRAMUSCULAR | Status: AC
  Filled 2017-04-26: qty 2

## 2017-04-26 MED ORDER — FENTANYL CITRATE (PF) 100 MCG/2ML IJ SOLN
INTRAMUSCULAR | Status: DC | PRN
  Administered 2017-04-26: 50 ug via INTRAVENOUS
  Administered 2017-04-26 (×2): 25 ug via INTRAVENOUS

## 2017-04-26 MED ORDER — PROPOFOL 10 MG/ML IV BOLUS
INTRAVENOUS | Status: AC
  Filled 2017-04-26: qty 20

## 2017-04-26 MED ORDER — CEFAZOLIN SODIUM-DEXTROSE 2-4 GM/100ML-% IV SOLN
2.0000 g | INTRAVENOUS | Status: AC
  Administered 2017-04-26: 2 g via INTRAVENOUS

## 2017-04-26 MED ORDER — FENTANYL CITRATE (PF) 100 MCG/2ML IJ SOLN
25.0000 ug | INTRAMUSCULAR | Status: DC | PRN
  Administered 2017-04-26 (×3): 50 ug via INTRAVENOUS

## 2017-04-26 MED ORDER — PROPOFOL 10 MG/ML IV BOLUS
INTRAVENOUS | Status: DC | PRN
  Administered 2017-04-26: 150 mg via INTRAVENOUS

## 2017-04-26 MED ORDER — ONDANSETRON HCL 4 MG PO TABS: 4.0000 mg | ORAL_TABLET | Freq: Four times a day (QID) | ORAL | Status: DC | PRN

## 2017-04-26 MED ORDER — METOCLOPRAMIDE HCL 5 MG/ML IJ SOLN: 5.0000 mg | Freq: Three times a day (TID) | INTRAMUSCULAR | Status: DC | PRN

## 2017-04-26 MED ORDER — LACTATED RINGERS IV SOLN
INTRAVENOUS | Status: DC | PRN
Start: 2017-04-26 — End: 2017-04-26
  Administered 2017-04-26: 08:00:00 via INTRAVENOUS

## 2017-04-26 MED ORDER — SODIUM CHLORIDE 0.9 % IV SOLN
1000.0000 mg | INTRAVENOUS | Status: AC
  Administered 2017-04-26: 1000 mg via INTRAVENOUS
  Filled 2017-04-26: qty 10

## 2017-04-26 MED ORDER — HYDROCODONE-ACETAMINOPHEN 5-325 MG PO TABS
1.0000 | ORAL_TABLET | Freq: Four times a day (QID) | ORAL | Status: DC | PRN
  Administered 2017-04-26 – 2017-04-28 (×6): 2 via ORAL
  Administered 2017-04-29 (×2): 1 via ORAL
  Filled 2017-04-26 (×2): qty 2
  Filled 2017-04-26: qty 1
  Filled 2017-04-26 (×5): qty 2

## 2017-04-26 MED ORDER — ACETAMINOPHEN 10 MG/ML IV SOLN
1000.0000 mg | Freq: Once | INTRAVENOUS | Status: AC
  Administered 2017-04-26: 1000 mg via INTRAVENOUS

## 2017-04-26 MED ORDER — PHENOL 1.4 % MT LIQD: 1.0000 | OROMUCOSAL | Status: DC | PRN

## 2017-04-26 MED ORDER — ALUM & MAG HYDROXIDE-SIMETH 200-200-20 MG/5ML PO SUSP: 30.0000 mL | ORAL | Status: DC | PRN

## 2017-04-26 MED ORDER — METHOCARBAMOL 500 MG PO TABS
500.0000 mg | ORAL_TABLET | Freq: Four times a day (QID) | ORAL | Status: DC | PRN
  Filled 2017-04-26 (×4): qty 1

## 2017-04-26 MED ORDER — CEFAZOLIN SODIUM-DEXTROSE 2-4 GM/100ML-% IV SOLN
2.0000 g | Freq: Four times a day (QID) | INTRAVENOUS | Status: AC
  Administered 2017-04-26 (×3): 2 g via INTRAVENOUS
  Filled 2017-04-26 (×3): qty 100

## 2017-04-26 MED ORDER — MIDAZOLAM HCL 5 MG/5ML IJ SOLN
INTRAMUSCULAR | Status: DC | PRN
  Administered 2017-04-26: 1 mg via INTRAVENOUS

## 2017-04-26 MED ORDER — ACETAMINOPHEN 10 MG/ML IV SOLN
INTRAVENOUS | Status: AC
  Administered 2017-04-26: 1000 mg via INTRAVENOUS
  Filled 2017-04-26: qty 100

## 2017-04-26 MED ORDER — DEXTROSE 5 % IV SOLN
500.0000 mg | Freq: Four times a day (QID) | INTRAVENOUS | Status: DC | PRN
  Filled 2017-04-26: qty 5

## 2017-04-26 MED ORDER — ONDANSETRON HCL 4 MG/2ML IJ SOLN
4.0000 mg | Freq: Four times a day (QID) | INTRAMUSCULAR | Status: DC | PRN
  Administered 2017-04-26 – 2017-04-27 (×2): 4 mg via INTRAVENOUS
  Filled 2017-04-26 (×2): qty 2

## 2017-04-26 MED ORDER — ONDANSETRON HCL 4 MG/2ML IJ SOLN
INTRAMUSCULAR | Status: DC | PRN
Start: 2017-04-26 — End: 2017-04-26
  Administered 2017-04-26: 4 mg via INTRAVENOUS

## 2017-04-26 MED ORDER — METHOCARBAMOL 1000 MG/10ML IJ SOLN
500.0000 mg | Freq: Once | INTRAVENOUS | Status: AC
  Administered 2017-04-26: 500 mg via INTRAVENOUS
  Filled 2017-04-26: qty 5

## 2017-04-26 SURGICAL SUPPLY — 36 items
BNDG COHESIVE 4X5 TAN NS LF (GAUZE/BANDAGES/DRESSINGS) ×3 IMPLANT
BNDG COHESIVE 6X5 TAN STRL LF (GAUZE/BANDAGES/DRESSINGS) IMPLANT
BNDG GAUZE ELAST 4 BULKY (GAUZE/BANDAGES/DRESSINGS) ×3 IMPLANT
COVER PERINEAL POST (MISCELLANEOUS) ×3 IMPLANT
COVER SURGICAL LIGHT HANDLE (MISCELLANEOUS) ×3 IMPLANT
DRAPE STERI IOBAN 125X83 (DRAPES) ×3 IMPLANT
DRSG MEPILEX BORDER 4X4 (GAUZE/BANDAGES/DRESSINGS) ×3 IMPLANT
DRSG MEPILEX BORDER 4X8 (GAUZE/BANDAGES/DRESSINGS) ×3 IMPLANT
DRSG PAD ABDOMINAL 8X10 ST (GAUZE/BANDAGES/DRESSINGS) ×6 IMPLANT
DURAPREP 26ML APPLICATOR (WOUND CARE) ×3 IMPLANT
ELECT REM PT RETURN 9FT ADLT (ELECTROSURGICAL) ×3
ELECTRODE REM PT RTRN 9FT ADLT (ELECTROSURGICAL) ×1 IMPLANT
GLOVE SKINSENSE NS SZ7.5 (GLOVE) ×4
GLOVE SKINSENSE STRL SZ7.5 (GLOVE) ×2 IMPLANT
GOWN STRL REIN XL XLG (GOWN DISPOSABLE) ×3 IMPLANT
GUIDE PIN 3.2X343 (PIN) ×1
GUIDE PIN 3.2X343MM (PIN) ×2
KIT BASIN OR (CUSTOM PROCEDURE TRAY) ×3 IMPLANT
KIT ROOM TURNOVER OR (KITS) ×3 IMPLANT
MANIFOLD NEPTUNE II (INSTRUMENTS) ×3 IMPLANT
NAIL TRIGEN LEFT 10X38-125 (Nail) ×3 IMPLANT
NS IRRIG 1000ML POUR BTL (IV SOLUTION) ×3 IMPLANT
PACK GENERAL/GYN (CUSTOM PROCEDURE TRAY) ×3 IMPLANT
PAD ARMBOARD 7.5X6 YLW CONV (MISCELLANEOUS) ×6 IMPLANT
PAD CAST 4YDX4 CTTN HI CHSV (CAST SUPPLIES) ×2 IMPLANT
PADDING CAST COTTON 4X4 STRL (CAST SUPPLIES) ×4
PIN GUIDE 3.2X343MM (PIN) ×1 IMPLANT
SCREW LAG COMPR KIT 100/95 (Screw) ×3 IMPLANT
STAPLER VISISTAT 35W (STAPLE) ×3 IMPLANT
SUT VIC AB 0 CT1 27 (SUTURE) ×2
SUT VIC AB 0 CT1 27XBRD ANBCTR (SUTURE) ×1 IMPLANT
SUT VIC AB 2-0 CT1 27 (SUTURE) ×2
SUT VIC AB 2-0 CT1 TAPERPNT 27 (SUTURE) ×1 IMPLANT
TOWEL OR 17X24 6PK STRL BLUE (TOWEL DISPOSABLE) ×3 IMPLANT
TOWEL OR 17X26 10 PK STRL BLUE (TOWEL DISPOSABLE) ×3 IMPLANT
WATER STERILE IRR 1000ML POUR (IV SOLUTION) ×3 IMPLANT

## 2017-04-26 NOTE — Progress Notes (Signed)
PROGRESS NOTE  Shelby Hill GMW:[id number] DOB: [birth year] DOA: 04/25/2017 PCP: No primary care provider on file.  HPI/Recap of past 24 hours: Shelby Hill is a 81 yo CF with PMH HLD, anxiety who presented on 04/26/17 after a mechanical all at home. Seen in [place] at [facility]. Xray revealed left intertrochanteric fracture. [name] for surgical intervention. PO# 0 post left intertrochanteric fracture repair by Dr Erlinda Hong.  Patient was seen and examined at her bedside. States her pain is well controlled when she does not move. Has no other complaints.   Assessment/Plan: Principal Problem:   Displaced intertrochanteric fracture of left femur, initial encounter for closed fracture ([place]) Active Problems:   HLD (hyperlipidemia)  POD # 0 post left intertrochanteric fracture repair post mechanical fall -orthopedic surgery following -PT/OT per ortho -pain management per ortho -lovenox sq 40 mg daily for dvt ppx  Normocytic anemia, blood loss -no prior records -hg 11.7 on presentation -hg 10.7 -no sign of overt bleeding -CBC am  HLD -continue statin  Anxiety -xanax    Code Status: Full  Family Communication: Multiple family members at her bedside  Disposition Plan: Stays another midnight for close monitoring POD #0 post left intertrochanteric fracture repair    Consultants:  Orthopedic surgery  Procedures:  Left trochanteric fracture repair POD #0  Antimicrobials:  None indicated  DVT prophylaxis:  Lovenox 40 mg sq daily   Objective: Vitals:   04/26/17 1107 04/26/17 1118 04/26/17 1122 04/26/17 1148  BP: 117/61  124/68 (!) 130/51  Pulse: 74 74 70 71  Resp: 12 13 12 13   Temp:    97.8 F (36.6 C)  TempSrc:    Oral  SpO2: 98% 90% 98% 98%    Intake/Output Summary (Last 24 hours) at 04/26/2017 1340 Last data filed at 04/26/2017 1332 Gross per 24 hour  Intake 1060.42 ml  Output 50 ml  Net 1010.42 ml   There were no  vitals filed for this visit.  Exam:   General:  81 yo CF WD WN NAD. A&O x3  Cardiovascular: RRR no murmurs rubs or gallops  Respiratory: CTA no wheezes or rhonchi  Abdomen: Soft NT ND NBS x4 quadrants  Musculoskeletal: left hip laterally no noted bleeding at site of incision which is covered by surgical dressing.   Skin: No rashes noted  Psychiatry: Mood is appropriate for condition and setting   Data Reviewed: CBC: Recent Labs  Lab 04/25/17 2114  WBC 10.5  NEUTROABS 9.0*  HGB 11.7*  HCT 35.3*  MCV 89.4  PLT 440   Basic Metabolic Panel: Recent Labs  Lab 04/25/17 2114  NA 138  K 4.0  CL 107  CO2 26  GLUCOSE 142*  BUN 15  CREATININE 0.68  CALCIUM 8.8*   GFR: CrCl cannot be calculated (Unknown ideal weight.). Liver Function Tests: Recent Labs  Lab 04/25/17 2114  AST 18  ALT 14  ALKPHOS 59  BILITOT 1.0  PROT 6.1*  ALBUMIN 3.3*   No results for input(s): LIPASE, AMYLASE in the last 168 hours. No results for input(s): AMMONIA in the last 168 hours. Coagulation Profile: No results for input(s): INR, PROTIME in the last 168 hours. Cardiac Enzymes: No results for input(s): CKTOTAL, CKMB, CKMBINDEX, TROPONINI in the last 168 hours. BNP (last 3 results) No results for input(s): PROBNP in the last 8760 hours. HbA1C: No results for input(s): HGBA1C in the last 72 hours. CBG: No results for input(s): GLUCAP in the last 168 hours.  Lipid Profile: No results for input(s): CHOL, HDL, LDLCALC, TRIG, CHOLHDL, LDLDIRECT in the last 72 hours. Thyroid Function Tests: No results for input(s): TSH, T4TOTAL, FREET4, T3FREE, THYROIDAB in the last 72 hours. Anemia Panel: No results for input(s): VITAMINB12, FOLATE, FERRITIN, TIBC, IRON, RETICCTPCT in the last 72 hours. Urine analysis: No results found for: COLORURINE, APPEARANCEUR, LABSPEC, PHURINE, GLUCOSEU, HGBUR, BILIRUBINUR, KETONESUR, PROTEINUR, UROBILINOGEN, NITRITE, LEUKOCYTESUR Sepsis  Labs: @LABRCNTIP (procalcitonin:4,lacticidven:4)  ) Recent Results (from the past 240 hour(s))  Surgical pcr screen     Status: None   Collection Time: 04/25/17 11:31 PM  Result Value Ref Range Status   MRSA, PCR NEGATIVE NEGATIVE Final   Staphylococcus aureus NEGATIVE NEGATIVE Final    Comment: (NOTE) The Xpert SA Assay (FDA approved for NASAL specimens in patients 62 years of age and older), is one component of a comprehensive surveillance program. It is not intended to diagnose infection nor to guide or monitor treatment.       Studies: Dg C-arm 1-60 Min  Result Date: 04/26/2017 CLINICAL DATA:  Internal fixation of LEFT femur EXAM: DG C-ARM 61-120 MIN; OPERATIVE LEFT HIP WITH PELVIS COMPARISON:  None. FINDINGS: Intramedullary nail fixation of LEFT intertrochanteric femur fracture. Two dynamic compression screws are present. IMPRESSION: Intramedullary nail fixation of intertrochanteric fracture. Electronically Signed   By: Suzy Bouchard M.D.   On: 04/26/2017 10:15   Dg Hip Operative Unilat W Or W/o Pelvis Left  Result Date: 04/26/2017 CLINICAL DATA:  Internal fixation of LEFT femur EXAM: DG C-ARM 61-120 MIN; OPERATIVE LEFT HIP WITH PELVIS COMPARISON:  None. FINDINGS: Intramedullary nail fixation of LEFT intertrochanteric femur fracture. Two dynamic compression screws are present. IMPRESSION: Intramedullary nail fixation of intertrochanteric fracture. Electronically Signed   By: Suzy Bouchard M.D.   On: 04/26/2017 10:15    Scheduled Meds: . [START ON 04/27/2017] enoxaparin (LOVENOX) injection  40 mg Subcutaneous Q24H    Continuous Infusions: . sodium chloride 125 mL/hr at 04/26/17 1215  .  ceFAZolin (ANCEF) IV Stopped (04/26/17 1332)  . methocarbamol (ROBAXIN)  IV    . methocarbamol (ROBAXIN)  IV       LOS: 1 day     Kayleen Memos, MD [facility] Pager [phone]  If 7PM-7AM, please contact night-coverage [email or web address] Password [email or web address] 04/26/2017, 1:40 PM

## 2017-04-26 NOTE — Anesthesia Preprocedure Evaluation (Signed)
Anesthesia Evaluation  Patient identified by MRN, date of birth, ID band Patient awake    Reviewed: Allergy & Precautions, NPO status , Patient's Chart, lab work & pertinent test results  Airway Mallampati: III  TM Distance: >3 FB Neck ROM: Full    Dental  (+) Teeth Intact, Dental Advisory Given   Pulmonary neg pulmonary ROS,    breath sounds clear to auscultation       Cardiovascular negative cardio ROS   Rhythm:Regular Rate:Normal     Neuro/Psych negative neurological ROS     GI/Hepatic negative GI ROS, Neg liver ROS,   Endo/Other  negative endocrine ROS  Renal/GU negative Renal ROS     Musculoskeletal negative musculoskeletal ROS (+)   Abdominal   Peds  Hematology negative hematology ROS (+)   Anesthesia Other Findings - HLD  Reproductive/Obstetrics                             Anesthesia Physical Anesthesia Plan  ASA: II  Anesthesia Plan: General   Post-op Pain Management:    Induction: Intravenous  PONV Risk Score and Plan: 4 or greater and Ondansetron and Dexamethasone  Airway Management Planned: LMA  Additional Equipment:   Intra-op Plan:   Post-operative Plan: Extubation in OR  Informed Consent: I have reviewed the patients History and Physical, chart, labs and discussed the procedure including the risks, benefits and alternatives for the proposed anesthesia with the patient or authorized representative who has indicated his/her understanding and acceptance.   Dental advisory given  Plan Discussed with: CRNA  Anesthesia Plan Comments:         Anesthesia Quick Evaluation

## 2017-04-26 NOTE — Progress Notes (Signed)
Nutrition Brief Note  Patient identified per Hip Fracture Protocol.   Wt Readings from Last 15 Encounters:  No data found for Wt    There is no height or weight on file to calculate BMI. Pt admitted for L hip fracture following a mechanical fall. Per notes, pt to have surgical fixation today and is a low risk for surgical procedure. Pt with no PMH. Current diet order is NPO as pt is in pre-op and then to OR for L hip surgery. Labs and medications reviewed.   No nutrition interventions warranted at this time. If nutrition issues arise, please consult RD.     Jarome Matin, MS, RD, LDN, [facility] Inpatient Clinical Dietitian Pager # [phone] After hours/weekend pager # [phone]

## 2017-04-26 NOTE — Anesthesia Postprocedure Evaluation (Signed)
Anesthesia Post Note  Patient: Rick Warnick  Procedure(s) Performed: INTRAMEDULLARY (IM) NAIL INTERTROCHANTRIC (Left Leg Upper)     Patient location during evaluation: PACU Anesthesia Type: General Level of consciousness: awake and alert Pain management: pain level controlled Vital Signs Assessment: post-procedure vital signs reviewed and stable Respiratory status: spontaneous breathing, nonlabored ventilation, respiratory function stable and patient connected to nasal cannula oxygen Cardiovascular status: blood pressure returned to baseline and stable Postop Assessment: no apparent nausea or vomiting Anesthetic complications: no    Last Vitals:  Vitals:   04/26/17 1118 04/26/17 1122  BP:  124/68  Pulse: 74 70  Resp: 13 12  Temp:    SpO2: 90% 98%    Last Pain:  Vitals:   04/26/17 1110  TempSrc:   PainSc: 2                  Effie Berkshire

## 2017-04-26 NOTE — Progress Notes (Signed)
Spoke with Anderson Malta  (ortho tech) about trapeze set up - pt wieghs approx 160lbs per pt Anderson Malta to evaluate pt on 5N to see if pt meets criteria based on age

## 2017-04-26 NOTE — Care Management Note (Signed)
Case Management Note  Patient Details  Name: Shelby Hill MRN: [id number] Date of Birth: 02-14-1936  Subjective/Objective: CM received consult for this 81 y/o F who is s/p IM nail of L Hip Fx. Await recommendations of PT/OT for DME/HH. Will follow.  Pt is uninsured so DME is likely.                   Action/Plan: CM will follow closely for disposition/discharge needs.    Expected Discharge Date:                  Expected Discharge Plan:     In-House Referral:     Discharge planning Services  CM Consult  Post Acute Care Choice:  Durable Medical Equipment, Home Health Choice offered to:     DME Arranged:    DME Agency:     HH Arranged:    HH Agency:     Status of Service:  In process, will continue to follow  If discussed at Long Length of Stay Meetings, dates discussed:    Additional Comments:  Delrae Sawyers, RN 04/26/2017, 12:41 PM

## 2017-04-26 NOTE — Transfer of Care (Signed)
Immediate Anesthesia Transfer of Care Note  Patient: Shelby Hill  Procedure(s) Performed: INTRAMEDULLARY (IM) NAIL INTERTROCHANTRIC (Left Leg Upper)  Patient Location: PACU  Anesthesia Type:General  Level of Consciousness: awake, alert  and oriented  Airway & Oxygen Therapy: Patient Spontanous Breathing and Patient connected to nasal cannula oxygen  Post-op Assessment: Report given to RN and Post -op Vital signs reviewed and stable  Post vital signs: Reviewed and stable  Last Vitals:  Vitals:   04/26/17 0403 04/26/17 1007  BP: (!) 116/56 129/79  Pulse: 88 87  Resp: 18 18  Temp: 36.9 C   SpO2: 95% 96%    Last Pain:  Vitals:   04/26/17 0458  TempSrc:   PainSc: 7          Complications: No apparent anesthesia complications

## 2017-04-26 NOTE — Plan of Care (Signed)
  Health Behavior/Discharge Planning: Ability to manage health-related needs will improve 04/26/2017 1835 - Progressing by Governor Rooks, RN   Clinical Measurements: Respiratory complications will improve 04/26/2017 1835 - Progressing by Governor Rooks, RN   Activity: Risk for activity intolerance will decrease 04/26/2017 1835 - Progressing by Governor Rooks, RN   Elimination: Will not experience complications related to urinary retention 04/26/2017 1835 - Progressing by Governor Rooks, RN   Pain Managment: General experience of comfort will improve 04/26/2017 1835 - Progressing by Governor Rooks, RN

## 2017-04-26 NOTE — Anesthesia Procedure Notes (Signed)
Procedure Name: LMA Insertion Date/Time: 04/26/2017 9:01 AM Performed by: Clearnce Sorrel, CRNA Pre-anesthesia Checklist: Patient identified, Emergency Drugs available, Suction available, Patient being monitored and Timeout performed Patient Re-evaluated:Patient Re-evaluated prior to induction Oxygen Delivery Method: Circle system utilized Preoxygenation: Pre-oxygenation with 100% oxygen Induction Type: IV induction LMA: LMA inserted LMA Size: 4.0 Number of attempts: 1 Placement Confirmation: positive ETCO2 and breath sounds checked- equal and bilateral Tube secured with: Tape Dental Injury: Teeth and Oropharynx as per pre-operative assessment

## 2017-04-26 NOTE — Op Note (Signed)
   Date of Surgery: 04/26/2017  INDICATIONS: Ms. Pollino is a 81 y.o.-year-old female who sustained a left hip fracture. The risks and benefits of the procedure discussed with the patient prior to the procedure and all questions were answered; consent was obtained.  PREOPERATIVE DIAGNOSIS: left hip fracture   POSTOPERATIVE DIAGNOSIS: Same   PROCEDURE: Treatment of intertrochanteric fracture with intramedullary implant. CPT [phone]   SURGEON: N. Eduard Roux, M.D.   ANESTHESIA: general   IV FLUIDS AND URINE: See anesthesia record   ESTIMATED BLOOD LOSS: 200 cc  IMPLANTS: [employer] InterTAN 10 x 38, 100/95  DRAINS: None.   COMPLICATIONS: None.   DESCRIPTION OF PROCEDURE: The patient was brought to the operating room and placed supine on the operating table. The patient's leg had been signed prior to the procedure. The patient had the anesthesia placed by the anesthesiologist. The prep verification and incision time-outs were performed to confirm that this was the correct patient, site, side and location. The patient had an SCD on the opposite lower extremity. The patient did receive antibiotics prior to the incision and was re-dosed during the procedure as needed at indicated intervals. The patient was positioned on the fracture table with the table in traction and internal rotation to reduce the hip. The well leg was placed in a scissor position and all bony prominences were well-padded. The patient had the lower extremity prepped and draped in the standard surgical fashion. The incision was made 4 finger breadths superior to the greater trochanter. A guide pin was inserted into the tip of the greater trochanter under fluoroscopic guidance. An opening reamer was used to gain access to the femoral canal. The nail length was measured and inserted down the femoral canal to its proper depth. The appropriate version of insertion for the lag screw was found under fluoroscopy. A pin was inserted up  the femoral neck through the jig. Then, a second antirotation pin was inserted inferior to the first pin. The length of the lag screw was then measured. The lag screw was inserted as near to center-center in the head as possible. The antirotation pin was then taken out and an interdigitating compression screw was placed in its place. The leg was taken out of traction, then the interdigitating compression screw was used to compress across the fracture. Compression was visualized on serial xrays. The wound was copiously irrigated with saline and the subcutaneous layer closed with 2.0 vicryl and the skin was reapproximated with staples. The wounds were cleaned and dried a final time and a sterile dressing was placed. The hip was taken through a range of motion at the end of the case under fluoroscopic imaging to visualize the approach-withdraw phenomenon and confirm implant length in the head. The patient was then awakened from anesthesia and taken to the recovery room in stable condition. All counts were correct at the end of the case.   POSTOPERATIVE PLAN: The patient will be weight bearing as tolerated and will return in 2 weeks for staple removal and the patient will receive DVT prophylaxis based on other medications, activity level, and risk ratio of bleeding to thrombosis.   Azucena Cecil, MD [place] 9:54 AM

## 2017-04-26 NOTE — Progress Notes (Signed)
Orthopedic Tech Progress Note Patient Details:  Shelby Hill [birth year] [id number]  Ortho Devices Ortho Device/Splint Location: trapeze bar Ortho Device/Splint Interventions: Application   Shelby Hill 04/26/2017, 6:15 PM

## 2017-04-27 LAB — CBC
HEMATOCRIT: 29.8 % — AB (ref 36.0–46.0)
HEMOGLOBIN: 9.8 g/dL — AB (ref 12.0–15.0)
MCH: 29.8 pg (ref 26.0–34.0)
MCHC: 32.9 g/dL (ref 30.0–36.0)
MCV: 90.6 fL (ref 78.0–100.0)
Platelets: 151 10*3/uL (ref 150–400)
RBC: 3.29 MIL/uL — ABNORMAL LOW (ref 3.87–5.11)
RDW: 13.6 % (ref 11.5–15.5)
WBC: 7.2 10*3/uL (ref 4.0–10.5)

## 2017-04-27 LAB — BASIC METABOLIC PANEL
Anion gap: 4 — ABNORMAL LOW (ref 5–15)
BUN: 8 mg/dL (ref 6–20)
CALCIUM: 8.1 mg/dL — AB (ref 8.9–10.3)
CHLORIDE: 106 mmol/L (ref 101–111)
CO2: 26 mmol/L (ref 22–32)
CREATININE: 0.67 mg/dL (ref 0.44–1.00)
GFR calc Af Amer: >60 mL/min (ref >60)
GFR calc non Af Amer: >60 mL/min (ref >60)
GLUCOSE: 125 mg/dL — AB (ref 65–99)
Potassium: 3.6 mmol/L (ref 3.5–5.1)
Sodium: 136 mmol/L (ref 135–145)

## 2017-04-27 NOTE — Progress Notes (Signed)
Subjective: 1 Day Post-Op Procedure(s) (LRB): INTRAMEDULLARY (IM) NAIL INTERTROCHANTRIC (Left) Patient reports pain as mild.    Objective: Vital signs in last 24 hours: Temp:  [97.8 F (36.6 C)-99.3 F (37.4 C)] 98.8 F (37.1 C) (12/02 0553) Pulse Rate:  [70-93] 85 (12/02 0553) Resp:  [12-16] 16 (12/02 0553) BP: (113-154)/(49-76) 114/49 (12/02 0553) SpO2:  [90 %-100 %] 98 % (12/02 0553)  Intake/Output from previous day: 12/01 0701 - 12/02 0700 In: 1180.4 [P.O.:120; I.V.:760.4; IV Piggyback:200] Out: 350 [Urine:300; Blood:50] Intake/Output this shift: Total I/O In: 120 [P.O.:120] Out: -   Recent Labs    04/25/17 2114 04/26/17 1319 04/27/17 0552  HGB 11.7* 10.4* 9.8*   Recent Labs    04/26/17 1319 04/27/17 0552  WBC 9.2 7.2  RBC 3.50* 3.29*  HCT 31.6* 29.8*  PLT 151 151   Recent Labs    04/25/17 2114 04/26/17 1319 04/27/17 0552  NA 138  --  136  K 4.0  --  3.6  CL 107  --  106  CO2 26  --  26  BUN 15  --  8  CREATININE 0.68 0.66 0.67  GLUCOSE 142*  --  125*  CALCIUM 8.8*  --  8.1*   No results for input(s): LABPT, INR in the last 72 hours.  Neurologically intact ABD soft Compartment soft Dressings dry, clean. No calf pain-comfortable Assessment/Plan: 1 Day Post-Op Procedure(s) (LRB): INTRAMEDULLARY (IM) NAIL INTERTROCHANTRIC (Left) Up with therapy Discharge to SNFwhen stable  Shelby Hill 04/27/2017, 10:28 AM

## 2017-04-27 NOTE — Evaluation (Signed)
Physical Therapy Evaluation Patient Details Name: Shelby Hill MRN: [id number] DOB: March 16, 1936 Today's Date: 04/27/2017   History of Present Illness  Ms. Shelby Hill is a 81 yo CF with PMH HLD, anxiety who presented on 04/26/17 after a mechanical all at home.  Xray revealed left intertrochanteric fracture. s/p left intertrochanteric fracture repair.  Clinical Impression  Pt admitted with above diagnosis. Pt currently with functional limitations due to the deficits listed below (see PT Problem List). On eval, pt required +2 mod assist bed mobility and +2 mod assist transfers. Pt very anxious about moving. Continuous verbal cues and increased time required to complete all functional mobility. Pt will benefit from skilled PT to increase their independence and safety with mobility to allow discharge to the venue listed below.       Follow Up Recommendations SNF    Equipment Recommendations  Other (comment)(defer to next venue)    Recommendations for Other Services       Precautions / Restrictions Precautions Precautions: Fall Restrictions Weight Bearing Restrictions: Yes LLE Weight Bearing: Weight bearing as tolerated      Mobility  Bed Mobility Overal bed mobility: Needs Assistance Bed Mobility: Supine to Sit     Supine to sit: Mod assist;+2 for physical assistance;HOB elevated     General bed mobility comments: +rail, continuous verbal cues for sequencing, use of bed pad to pivot to EOB. Increased time to complete.  Transfers Overall transfer level: Needs assistance Equipment used: Rolling walker (2 wheeled) Transfers: Sit to/from [facility] Sit to Stand: +2 physical assistance;Mod assist Stand pivot transfers: +2 physical assistance;Mod assist       General transfer comment: verbal cues for hand placement and sequencing. Assist to power up. Pt able to take small pivot steps toward right. Pt with c/o dizziness. BP taken in recliner,  118/59.  Ambulation/Gait             General Gait Details: unable due to pain, dizziness, and anxiety.  Stairs            Wheelchair Mobility    Modified Rankin (Stroke Patients Only)       Balance Overall balance assessment: Needs assistance Sitting-balance support: Feet supported;Single extremity supported Sitting balance-Leahy Scale: Fair Sitting balance - Comments: able to sit EOB with min guard for safety (initially min A)   Standing balance support: Bilateral upper extremity supported;During functional activity Standing balance-Leahy Scale: Poor Standing balance comment: reliant on RW and external support from therapy                             Pertinent Vitals/Pain Pain Assessment: Faces Faces Pain Scale: Hurts even more Pain Location: L leg Pain Descriptors / Indicators: Burning;Discomfort;Grimacing;Moaning;Heaviness Pain Intervention(s): Repositioned;Limited activity within patient's tolerance;Monitored during session;Patient requesting pain meds-RN notified;Ice applied    Home Living Family/patient expects to be discharged to:: Private residence Living Arrangements: Spouse/significant other Available Help at Discharge: Family;Available 24 hours/day Type of Home: House Home Access: Stairs to enter Entrance Stairs-Rails: Right;Left;Can reach both Entrance Stairs-Number of Steps: 3 Home Layout: Able to live on main level with bedroom/bathroom Home Equipment: Bedside commode;Tub bench Additional Comments: Husband has had 3 knee surgeries    Prior Function Level of Independence: Independent         Comments: drives, does pilates     Hand Dominance   Dominant Hand: Right    Extremity/Trunk Assessment   Upper Extremity Assessment Upper Extremity Assessment: Overall WFL for  tasks assessed    Lower Extremity Assessment Lower Extremity Assessment: LLE deficits/detail LLE Deficits / Details: expected post-op deficits in ROM and  Strength LLE: Unable to fully assess due to pain LLE Coordination: decreased fine motor;decreased gross motor    Cervical / Trunk Assessment Cervical / Trunk Assessment: Normal  Communication   Communication: No difficulties  Cognition Arousal/Alertness: Awake/alert Behavior During Therapy: Anxious Overall Cognitive Status: Within Functional Limits for tasks assessed                                 General Comments: Pt really appreciates it if you explain movements before you do them, and GO SLOW. Pt is very affected by anxiety      General Comments General comments (skin integrity, edema, etc.): with transfer, Pt complaining of weakness and dizziness. In recliner legs elevated and back reclined, cool wash cloth provided for forehead. BP was stable (taken in reclined position)    Exercises General Exercises - Lower Extremity Ankle Circles/Pumps: AROM;Both;10 reps Other Exercises Other Exercises: Pt educated to do chair push ups as able   Assessment/Plan    PT Assessment Patient needs continued PT services  PT Problem List Decreased strength;Decreased mobility;Decreased safety awareness;Decreased activity tolerance;Pain;Decreased knowledge of use of DME;Decreased balance       PT Treatment Interventions DME instruction;Therapeutic activities;Gait training;Therapeutic exercise;Patient/family education;Balance training;Stair training;Functional mobility training    PT Goals (Current goals can be found in the Care Plan section)  Acute Rehab PT Goals Patient Stated Goal: to get stronger and decrease pain PT Goal Formulation: With patient/family Time For Goal Achievement: 05/04/17 Potential to Achieve Goals: Good    Frequency Min 3X/week   Barriers to discharge        Co-evaluation PT/OT/SLP Co-Evaluation/Treatment: Yes Reason for Co-Treatment: For patient/therapist safety;To address functional/ADL transfers PT goals addressed during session: Mobility/safety  with mobility;Balance OT goals addressed during session: ADL's and self-care       AM-PAC PT "6 Clicks" Daily Activity  Outcome Measure Difficulty turning over in bed (including adjusting bedclothes, sheets and blankets)?: Unable Difficulty moving from lying on back to sitting on the side of the bed? : Unable Difficulty sitting down on and standing up from a chair with arms (e.g., wheelchair, bedside commode, etc,.)?: Unable Help needed moving to and from a bed to chair (including a wheelchair)?: A Lot Help needed walking in hospital room?: A Lot Help needed climbing 3-5 steps with a railing? : Total 6 Click Score: 8    End of Session Equipment Utilized During Treatment: Gait belt Activity Tolerance: Patient tolerated treatment well Patient left: in chair;with call bell/phone within reach;with family/visitor present Nurse Communication: Mobility status;Patient requests pain meds PT Visit Diagnosis: Difficulty in walking, not elsewhere classified (R26.2);Pain Pain - Right/Left: Left Pain - part of body: Hip    Time: 9323-5573 PT Time Calculation (min) (ACUTE ONLY): 33 min   Charges:   PT Evaluation $PT Eval Moderate Complexity: 1 Mod     PT G Codes:        Lorrin Goodell, PT  Office # [phone] Pager [phone]   Lorriane Shire 04/27/2017, 10:44 AM

## 2017-04-27 NOTE — Evaluation (Addendum)
Occupational Therapy Evaluation Patient Details Name: Shelby Hill MRN: [id number] DOB: Oct 19, 1935 Today's Date: 04/27/2017    History of Present Illness Ms. Cashion is a 81 yo CF with PMH HLD, anxiety who presented on 04/26/17 after a mechanical all at home.  Xray revealed left intertrochanteric fracture. s/p left intertrochanteric fracture repair.   Clinical Impression   PTA Pt independent in ADL, IADL and mobility. Pt went to pilates classes, drives etc. Pt is currently max A for LB ADL and mod A +2 for stand pivot transfers. Pt will benefit from skilled OT in the acute setting and afterwards at SNF level to maximize safety and independence in ADL and functional transfers. Pt does better if you go slow and explain movement/activity before you do it. She is impacted by anxiety but will work as long as you communicate with you. Next session to focus on continuing education for LB dressing/bath and compensatory strategies.     Follow Up Recommendations  SNF;Supervision/Assistance - 24 hour Pt and family interested in [facility] as they are in [place], [place]   Equipment Recommendations  Other (comment)(defer to next venue of care)    Recommendations for Other Services       Precautions / Restrictions Precautions Precautions: Fall Restrictions Weight Bearing Restrictions: Yes LLE Weight Bearing: Weight bearing as tolerated      Mobility Bed Mobility Overal bed mobility: Needs Assistance Bed Mobility: Supine to Sit     Supine to sit: Mod assist;+2 for physical assistance;+2 for safety/equipment;HOB elevated     General bed mobility comments: assist for BLE, vc for sequencing, use of rails, assist for trunk elevation, and use of bed pad to assist bringing hips EOB - Pt likes to GO SLOW  Transfers Overall transfer level: Needs assistance Equipment used: Rolling walker (2 wheeled) Transfers: Sit to/from [facility] Sit to Stand: Mod assist;+2 physical  assistance;+2 safety/equipment Stand pivot transfers: +2 physical assistance;+2 safety/equipment;Mod assist       General transfer comment: vc for safe hand placement; mod A for power up, assist to maintain balance throughout; Pt with very slow and small pivotal steps with vc for sequencing and safety; only able to tolerate TDWB this session in LLE    Balance Overall balance assessment: Needs assistance Sitting-balance support: Single extremity supported;Feet supported Sitting balance-Leahy Scale: Fair Sitting balance - Comments: able to sit EOB with min guard for safety (initially min A)   Standing balance support: Bilateral upper extremity supported;During functional activity Standing balance-Leahy Scale: Poor Standing balance comment: reliant on RW and external support from therapy                           ADL either performed or assessed with clinical judgement   ADL Overall ADL's : Needs assistance/impaired Eating/Feeding: Modified independent Eating/Feeding Details (indicate cue type and reason): feeling nauseous, limited intake this a.m. Grooming: Wash/dry hands;Wash/dry face;Set up;Sitting Grooming Details (indicate cue type and reason): in recliner Upper Body Bathing: Sitting;Minimal assistance   Lower Body Bathing: Maximal assistance;Sitting/lateral leans   Upper Body Dressing : Set up;Sitting   Lower Body Dressing: Maximal assistance;+2 for physical assistance;+2 for safety/equipment Lower Body Dressing Details (indicate cue type and reason): unable to bend down or bed knee up Toilet Transfer: Moderate assistance;+2 for physical assistance;+2 for safety/equipment;Stand-pivot;RW;Cueing for safety;Cueing for sequencing Toilet Transfer Details (indicate cue type and reason): vc for safe hand placement and mod A for power up, Pt with very slow  and small pivotal steps only able to tolerate TDWB this session Toileting- Clothing Manipulation and Hygiene: Maximal  assistance;+2 for physical assistance;+2 for safety/equipment;Sit to/from stand       Functional mobility during ADLs: Moderate assistance;+2 for physical assistance;+2 for safety/equipment;Rolling walker(stand pivot only this session) General ADL Comments: Pt limited by pain and anxiety     Vision Patient Visual Report: No change from baseline       Perception     Praxis      Pertinent Vitals/Pain Pain Assessment: Faces Faces Pain Scale: Hurts even more Pain Location: L leg Pain Descriptors / Indicators: Burning;Discomfort;Grimacing;Moaning;Heaviness Pain Intervention(s): Monitored during session;Repositioned;Patient requesting pain meds-RN notified;Ice applied     Hand Dominance Right   Extremity/Trunk Assessment Upper Extremity Assessment Upper Extremity Assessment: Overall WFL for tasks assessed;Generalized weakness   Lower Extremity Assessment Lower Extremity Assessment: LLE deficits/detail LLE Deficits / Details: expected post-op deficits in ROM and Strength LLE: Unable to fully assess due to pain LLE Coordination: decreased fine motor;decreased gross motor       Communication Communication Communication: No difficulties   Cognition Arousal/Alertness: Awake/alert Behavior During Therapy: Anxious Overall Cognitive Status: Within Functional Limits for tasks assessed                                 General Comments: Pt really appreciates it if you explain movements before you do them, and GO SLOW. Pt is very affected by anxiety   General Comments  with transfer, Pt complaining of weakness and dizziness. In recliner legs elevated and back reclined, cool wash cloth provided for forehead. BP was stable (taken in reclined position)    Exercises Exercises: Other exercises Other Exercises Other Exercises: Pt educated to do chair push ups as able   Shoulder Instructions      Home Living Family/patient expects to be discharged to:: Private  residence Living Arrangements: Spouse/significant other Available Help at Discharge: Family Type of Home: House Home Access: Stairs to enter Technical brewer of Steps: 3 Entrance Stairs-Rails: Right;Left;Can reach both Home Layout: Able to live on main level with bedroom/bathroom     Bathroom Shower/Tub: Teacher, early years/pre: Handicapped height Bathroom Accessibility: No   Home Equipment: Bedside commode;Tub bench   Additional Comments: Husband has had 3 knee surgeries      Prior Functioning/Environment Level of Independence: Independent        Comments: drives, does pilates        OT Problem List: Decreased strength;Decreased range of motion;Decreased activity tolerance;Impaired balance (sitting and/or standing);Decreased safety awareness;Decreased knowledge of use of DME or AE;Decreased knowledge of precautions;Pain      OT Treatment/Interventions: Self-care/ADL training;Therapeutic exercise;DME and/or AE instruction;Therapeutic activities;Patient/family education;Balance training    OT Goals(Current goals can be found in the care plan section) Acute Rehab OT Goals Patient Stated Goal: to get stronger and decrease pain OT Goal Formulation: With patient Time For Goal Achievement: 05/11/17 Potential to Achieve Goals: Good ADL Goals Pt Will Perform Lower Body Bathing: with supervision;sitting/lateral leans;with adaptive equipment Pt Will Perform Lower Body Dressing: with mod assist;with adaptive equipment;sit to/from stand Pt Will Transfer to Toilet: with min assist;stand pivot transfer;bedside commode Pt Will Perform Toileting - Clothing Manipulation and hygiene: with min guard assist;sit to/from stand Additional ADL Goal #1: Pt will perform bed mobility as precursor to ADL activity at supervision level  OT Frequency: Min 2X/week   Barriers to D/C:  Co-evaluation PT/OT/SLP Co-Evaluation/Treatment: Yes Reason for Co-Treatment: For  patient/therapist safety;To address functional/ADL transfers PT goals addressed during session: Mobility/safety with mobility OT goals addressed during session: ADL's and self-care      AM-PAC PT "6 Clicks" Daily Activity     Outcome Measure Help from another person eating meals?: None Help from another person taking care of personal grooming?: A Little Help from another person toileting, which includes using toliet, bedpan, or urinal?: A Lot Help from another person bathing (including washing, rinsing, drying)?: A Lot Help from another person to put on and taking off regular upper body clothing?: A Little Help from another person to put on and taking off regular lower body clothing?: Total 6 Click Score: 15   End of Session Equipment Utilized During Treatment: Gait belt;Rolling walker Nurse Communication: Mobility status;Patient requests pain meds;Other (comment)(how to transfer Pt back to bed)  Activity Tolerance: Patient tolerated treatment well;Other (comment)(impacted by pain and anxiety) Patient left: in chair;with call bell/phone within reach;with family/visitor present  OT Visit Diagnosis: Unsteadiness on feet (R26.81);Other abnormalities of gait and mobility (R26.89);History of falling (Z91.81);Pain Pain - Right/Left: Left Pain - part of body: Leg                Time: 6644-0347 OT Time Calculation (min): 33 min Charges:  OT General Charges $OT Visit: 1 Visit OT Evaluation $OT Eval Moderate Complexity: 1 Mod G-Codes:     Hulda Humphrey OTR/L [phone]  [place] 04/27/2017, 10:27 AM   Addendum: Added Rehab facility that Pt and family are interested in

## 2017-04-27 NOTE — Progress Notes (Signed)
PROGRESS NOTE  Neta Upadhyay OHY:[id number] DOB: 09/08/1935 DOA: 04/25/2017 PCP: No primary care provider on file.  HPI/Recap of past 24 hours: Ms. Earnhardt is a 81 yo CF with PMH HLD, anxiety who presented on 04/26/17 after a mechanical all at home. Seen in [place] at [facility]. Xray revealed left intertrochanteric fracture. Patient transferred to [facility] for surgical intervention. PO# 1 post left intertrochanteric fracture repair by Dr Erlinda Hong.  No acute events overnight. Pt was seen and examined while she was sitting on the chait post PT exercise. Pain is well controlled. No acute complaints.  Assessment/Plan: Principal Problem:   Displaced intertrochanteric fracture of left femur, initial encounter for closed fracture (HCC) Active Problems:   HLD (hyperlipidemia)  POD # 1 post left intertrochanteric fracture repair post mechanical fall -orthopedic surgery following -PT/OT per ortho -pain management per ortho -lovenox sq 40 mg daily for dvt ppx  Normocytic anemia, blood loss -no prior records -stable -hg 9.8 from 10.4  -hg 11.7 on presentation -no sign of overt bleeding -CBC am  HLD -continue statin   Anxiety -xanax    Code Status: Full  Family Communication: No family members at bedside.  Disposition Plan: Stays another midnight for close monitoring POD #1 post left intertrochanteric fracture repair. Needs placement to continue physical therapy.   Consultants:  Orthopedic surgery  Procedures:  Left trochanteric fracture repair POD #1  Antimicrobials:  None indicated  DVT prophylaxis:  Lovenox 40 mg sq daily   Objective: Vitals:   04/26/17 1544 04/26/17 1825 04/26/17 2055 04/27/17 0553  BP:   (!) 147/63 (!) 114/49  Pulse:   93 85  Resp:   16 16  Temp:   99.3 F (37.4 C) 98.8 F (37.1 C)  TempSrc:   Oral Oral  SpO2: 97% 96% 98% 98%    Intake/Output Summary (Last 24 hours) at 04/27/2017 0820 Last data filed at 04/26/2017  1800 Gross per 24 hour  Intake 1180.42 ml  Output 350 ml  Net 830.42 ml   There were no vitals filed for this visit.  Exam:   General:  81 yo CF WD WN NAD. A&O x3  Cardiovascular: RRR no murmurs rubs or gallops  Respiratory: CTA no wheezes or rhonchi  Abdomen: Soft NT ND NBS x4 quadrants  Musculoskeletal: left hip laterally no noted bleeding at site of incision which is covered by surgical dressing.   Skin: No rashes noted  Psychiatry: Mood is appropriate for condition and setting   Data Reviewed: CBC: Recent Labs  Lab 04/25/17 2114 04/26/17 1319 04/27/17 0552  WBC 10.5 9.2 7.2  NEUTROABS 9.0*  --   --   HGB 11.7* 10.4* 9.8*  HCT 35.3* 31.6* 29.8*  MCV 89.4 90.3 90.6  PLT 176 151 948   Basic Metabolic Panel: Recent Labs  Lab 04/25/17 2114 04/26/17 1319 04/27/17 0552  NA 138  --  136  K 4.0  --  3.6  CL 107  --  106  CO2 26  --  26  GLUCOSE 142*  --  125*  BUN 15  --  8  CREATININE 0.68 0.66 0.67  CALCIUM 8.8*  --  8.1*   GFR: CrCl cannot be calculated (Unknown ideal weight.). Liver Function Tests: Recent Labs  Lab 04/25/17 2114  AST 18  ALT 14  ALKPHOS 59  BILITOT 1.0  PROT 6.1*  ALBUMIN 3.3*   No results for input(s): LIPASE, AMYLASE in the last 168 hours. No results for input(s): AMMONIA  in the last 168 hours. Coagulation Profile: No results for input(s): INR, PROTIME in the last 168 hours. Cardiac Enzymes: No results for input(s): CKTOTAL, CKMB, CKMBINDEX, TROPONINI in the last 168 hours. BNP (last 3 results) No results for input(s): PROBNP in the last 8760 hours. HbA1C: No results for input(s): HGBA1C in the last 72 hours. CBG: No results for input(s): GLUCAP in the last 168 hours. Lipid Profile: No results for input(s): CHOL, HDL, LDLCALC, TRIG, CHOLHDL, LDLDIRECT in the last 72 hours. Thyroid Function Tests: No results for input(s): TSH, T4TOTAL, FREET4, T3FREE, THYROIDAB in the last 72 hours. Anemia Panel: No results for  input(s): VITAMINB12, FOLATE, FERRITIN, TIBC, IRON, RETICCTPCT in the last 72 hours. Urine analysis: No results found for: COLORURINE, APPEARANCEUR, LABSPEC, PHURINE, GLUCOSEU, HGBUR, BILIRUBINUR, KETONESUR, PROTEINUR, UROBILINOGEN, NITRITE, LEUKOCYTESUR Sepsis Labs: @LABRCNTIP (procalcitonin:4,lacticidven:4)  ) Recent Results (from the past 240 hour(s))  Surgical pcr screen     Status: None   Collection Time: 04/25/17 11:31 PM  Result Value Ref Range Status   MRSA, PCR NEGATIVE NEGATIVE Final   Staphylococcus aureus NEGATIVE NEGATIVE Final    Comment: (NOTE) The Xpert SA Assay (FDA approved for NASAL specimens in patients 1 years of age and older), is one component of a comprehensive surveillance program. It is not intended to diagnose infection nor to guide or monitor treatment.       Studies: Dg C-arm 1-60 Min  Result Date: 04/26/2017 CLINICAL DATA:  Internal fixation of LEFT femur EXAM: DG C-ARM 61-120 MIN; OPERATIVE LEFT HIP WITH PELVIS COMPARISON:  None. FINDINGS: Intramedullary nail fixation of LEFT intertrochanteric femur fracture. Two dynamic compression screws are present. IMPRESSION: Intramedullary nail fixation of intertrochanteric fracture. Electronically Signed   By: Suzy Bouchard M.D.   On: 04/26/2017 10:15   Dg Hip Operative Unilat W Or W/o Pelvis Left  Result Date: 04/26/2017 CLINICAL DATA:  Internal fixation of LEFT femur EXAM: DG C-ARM 61-120 MIN; OPERATIVE LEFT HIP WITH PELVIS COMPARISON:  None. FINDINGS: Intramedullary nail fixation of LEFT intertrochanteric femur fracture. Two dynamic compression screws are present. IMPRESSION: Intramedullary nail fixation of intertrochanteric fracture. Electronically Signed   By: Suzy Bouchard M.D.   On: 04/26/2017 10:15    Scheduled Meds: . enoxaparin (LOVENOX) injection  40 mg Subcutaneous Q24H    Continuous Infusions: . sodium chloride 125 mL/hr at 04/27/17 4315  . methocarbamol (ROBAXIN)  IV    . methocarbamol  (ROBAXIN)  IV       LOS: 2 days     Kayleen Memos, MD [facility] Pager [phone]  If 7PM-7AM, please contact night-coverage [email or web address] Password [email or web address] 04/27/2017, 8:20 AM

## 2017-04-28 ENCOUNTER — Encounter (HOSPITAL_COMMUNITY): Payer: Self-pay | Admitting: Orthopaedic Surgery

## 2017-04-28 DIAGNOSIS — R262 Difficulty in walking, not elsewhere classified: Secondary | ICD-10-CM

## 2017-04-28 DIAGNOSIS — S72002A Fracture of unspecified part of neck of left femur, initial encounter for closed fracture: Secondary | ICD-10-CM

## 2017-04-28 DIAGNOSIS — Z419 Encounter for procedure for purposes other than remedying health state, unspecified: Secondary | ICD-10-CM

## 2017-04-28 LAB — CBC
HEMATOCRIT: 26.8 % — AB (ref 36.0–46.0)
Hemoglobin: 8.9 g/dL — ABNORMAL LOW (ref 12.0–15.0)
MCH: 29.9 pg (ref 26.0–34.0)
MCHC: 33.2 g/dL (ref 30.0–36.0)
MCV: 89.9 fL (ref 78.0–100.0)
Platelets: 138 10*3/uL — ABNORMAL LOW (ref 150–400)
RBC: 2.98 MIL/uL — ABNORMAL LOW (ref 3.87–5.11)
RDW: 13.5 % (ref 11.5–15.5)
WBC: 7.2 10*3/uL (ref 4.0–10.5)

## 2017-04-28 LAB — BASIC METABOLIC PANEL
Anion gap: 5 (ref 5–15)
BUN: 8 mg/dL (ref 6–20)
CALCIUM: 8.1 mg/dL — AB (ref 8.9–10.3)
CO2: 26 mmol/L (ref 22–32)
CREATININE: 0.59 mg/dL (ref 0.44–1.00)
Chloride: 106 mmol/L (ref 101–111)
GFR calc Af Amer: >60 mL/min (ref >60)
GFR calc non Af Amer: >60 mL/min (ref >60)
GLUCOSE: 98 mg/dL (ref 65–99)
Potassium: 3.3 mmol/L — ABNORMAL LOW (ref 3.5–5.1)
Sodium: 137 mmol/L (ref 135–145)

## 2017-04-28 MED ORDER — POTASSIUM CHLORIDE CRYS ER 20 MEQ PO TBCR
40.0000 meq | EXTENDED_RELEASE_TABLET | Freq: Once | ORAL | Status: AC
  Administered 2017-04-28: 40 meq via ORAL
  Filled 2017-04-28: qty 2

## 2017-04-28 NOTE — Social Work (Signed)
CSW met with family at bedside to discuss bed offers. Pt has accepted bed offer at [facility] confirmed bed placement at SNF with admissions staff.  Pt will discharge tomorrow to SNF.  Elissa Hefty, LCSW Clinical Social Worker [phone]

## 2017-04-28 NOTE — Clinical Social Work Note (Signed)
Clinical Social Work Assessment  Patient Details  Name: Shelby Hill MRN: [id number] Date of Birth: 06-15-1935  Date of referral:  04/28/17               Reason for consult:  Facility Placement                Permission sought to share information with:  Facility Art therapist granted to share information::  Yes, Verbal Permission Granted  Name::     daughter  Agency::  SNF  Relationship::     Contact Information:     Housing/Transportation Living arrangements for the past 2 months:  Single Family Home Source of Information:  Patient, Adult Children Patient Interpreter Needed:  None Criminal Activity/Legal Involvement Pertinent to Current Situation/Hospitalization:  No - Comment as needed Significant Relationships:  Adult Children, Other Family Members, Spouse Lives with:  Spouse Do you feel safe going back to the place where you live?  No Need for family participation in patient care:  No (Coment)  Care giving concerns:  Pt from home with spouse. Pt agrees with recommendations for short term rehab. Pt indicates that she was independent with ADL's prior to hospitalization. Pt unsafe to return home at this time.  Social Worker assessment / plan:  CSW discussed SNF placement and options. Pt has never experienced SNF and had many questions answered. Pt gave CSW permission to send to [facility] and [facility]. CSW discussed barriers. Pt in agreement and gave permission to send to  SNF's.  FL2 completed and sent out.  CSW awaiting insurance card as family is supposed to bring to registration. Pt indicated that she has Medicare.  Employment status:  Retired Forensic scientist:  Medicare PT Recommendations:  [place] / Referral to community resources:  [place]  Patient/Family's Response to care:  Psychologist, prison and probation services of CSW coming to meet and discuss SNF options and placement. No issues or concern  identified.  Patient/Family's Understanding of and Emotional Response to Diagnosis, Current Treatment, and Prognosis:  Patient has good understanding of diagnosis, current treatment and prognosis as she is agreeable to SNF placement and understands that she is not well enough to go home at this time. Pt hopes to return to baseline with therapy. No issues or concerns identified at this time.  Emotional Assessment Appearance:  Appears stated age Attitude/Demeanor/Rapport:  (cooperative) Affect (typically observed):  Accepting, Appropriate Orientation:  Oriented to Situation, Oriented to  Time, Oriented to Place, Oriented to Self Alcohol / Substance use:  Not Applicable Psych involvement (Current and /or in the community):  No (Comment)  Discharge Needs  Concerns to be addressed:  Discharge Planning Concerns Readmission within the last 30 days:  No Current discharge risk:  Dependent with Mobility, Physical Impairment Barriers to Discharge:  No Barriers Identified   Normajean Baxter, LCSW 04/28/2017, 1:31 PM

## 2017-04-28 NOTE — Progress Notes (Addendum)
PROGRESS NOTE  Stellah Donovan MPN:[id number] DOB: [birth year] DOA: 04/25/2017 PCP: No primary care provider on file.  HPI/Recap of past 24 hours: Ms. Clyatt is a 81 yo CF with PMH HLD, anxiety who presented on 04/26/17 after a mechanical all at home. Seen in [place] at [facility]. Xray revealed left intertrochanteric fracture. Patient transferred to [facility] for surgical intervention. PO# 1 post left intertrochanteric fracture repair by Dr Erlinda Hong.  No acute events overnight. Pt was seen and examined with her family at her bedside. No acute complaints. Pain is well controlled. Continues to work with PT who recommends SNF. Social worker is working on SNF bed placement.   Assessment/Plan: Principal Problem:   Displaced intertrochanteric fracture of left femur, initial encounter for closed fracture (HCC) Active Problems:   HLD (hyperlipidemia)  POD # 2 post left intertrochanteric fracture repair post mechanical fall -orthopedic surgery following -PT/OT per ortho -pain management per ortho -lovenox sq 40 mg daily for dvt ppx  Normocytic anemia, acute blood loss -likely from blood loss -no prior records -stable -hg 8.9 from 9.8 from 10.4  -hg 11.7 on presentation -no sign of overt bleeding -CBC am  HLD -continue statin   Anxiety -xanax  Hypokalemia -K+ 3.3 -replete as indicated -BMP am    Code Status: Full  Family Communication: Family member at bedside.  Disposition Plan: Stays another midnight for close monitoring POD #2 post left intertrochanteric fracture repair. Social worker working on SNF bed placement to continue physical therapy.   Consultants:  Orthopedic surgery  Procedures:  Left trochanteric fracture repair POD #2  Antimicrobials:  None indicated  DVT prophylaxis:  Lovenox 40 mg sq daily   Objective: Vitals:   04/27/17 0553 04/27/17 1300 04/27/17 2140 04/28/17 0514  BP: (!) 114/49 (!) 125/5 (!) 119/52 121/65  Pulse: 85 95  95 100  Resp: 16 16 16 16   Temp: 98.8 F (37.1 C) 98 F (36.7 C) 99.8 F (37.7 C) 99.3 F (37.4 C)  TempSrc: Oral Oral Oral Oral  SpO2: 98% 97% 94% 95%  Weight:  80.3 kg (177 lb 0.5 oz)    Height:  5\' 6"  (1.676 m)      Intake/Output Summary (Last 24 hours) at 04/28/2017 1548 Last data filed at 04/28/2017 0700 Gross per 24 hour  Intake 240 ml  Output -  Net 240 ml   Filed Weights   04/27/17 1300  Weight: 80.3 kg (177 lb 0.5 oz)    Exam:   General:  81 yo CF WD WN NAD. A&O x3  Cardiovascular: RRR no murmurs rubs or gallops  Respiratory: CTA no wheezes or rhonchi  Abdomen: Soft NT ND NBS x4 quadrants  Musculoskeletal: left hip laterally no noted bleeding at site of incision which is covered by surgical dressing.   Skin: No rashes noted  Psychiatry: Mood is appropriate for condition and setting   Data Reviewed: CBC: Recent Labs  Lab 04/25/17 2114 04/26/17 1319 04/27/17 0552 04/28/17 0456  WBC 10.5 9.2 7.2 7.2  NEUTROABS 9.0*  --   --   --   HGB 11.7* 10.4* 9.8* 8.9*  HCT 35.3* 31.6* 29.8* 26.8*  MCV 89.4 90.3 90.6 89.9  PLT 176 151 151 008*   Basic Metabolic Panel: Recent Labs  Lab 04/25/17 2114 04/26/17 1319 04/27/17 0552 04/28/17 0456  NA 138  --  136 137  K 4.0  --  3.6 3.3*  CL 107  --  106 106  CO2 26  --  26 26  GLUCOSE 142*  --  125* 98  BUN 15  --  8 8  CREATININE 0.68 0.66 0.67 0.59  CALCIUM 8.8*  --  8.1* 8.1*   GFR: Estimated Creatinine Clearance: 60.9 mL/min (by C-G formula based on SCr of 0.59 mg/dL). Liver Function Tests: Recent Labs  Lab 04/25/17 2114  AST 18  ALT 14  ALKPHOS 59  BILITOT 1.0  PROT 6.1*  ALBUMIN 3.3*   No results for input(s): LIPASE, AMYLASE in the last 168 hours. No results for input(s): AMMONIA in the last 168 hours. Coagulation Profile: No results for input(s): INR, PROTIME in the last 168 hours. Cardiac Enzymes: No results for input(s): CKTOTAL, CKMB, CKMBINDEX, TROPONINI in the last 168  hours. BNP (last 3 results) No results for input(s): PROBNP in the last 8760 hours. HbA1C: No results for input(s): HGBA1C in the last 72 hours. CBG: No results for input(s): GLUCAP in the last 168 hours. Lipid Profile: No results for input(s): CHOL, HDL, LDLCALC, TRIG, CHOLHDL, LDLDIRECT in the last 72 hours. Thyroid Function Tests: No results for input(s): TSH, T4TOTAL, FREET4, T3FREE, THYROIDAB in the last 72 hours. Anemia Panel: No results for input(s): VITAMINB12, FOLATE, FERRITIN, TIBC, IRON, RETICCTPCT in the last 72 hours. Urine analysis: No results found for: COLORURINE, APPEARANCEUR, LABSPEC, PHURINE, GLUCOSEU, HGBUR, BILIRUBINUR, KETONESUR, PROTEINUR, UROBILINOGEN, NITRITE, LEUKOCYTESUR Sepsis Labs: @LABRCNTIP (procalcitonin:4,lacticidven:4)  ) Recent Results (from the past 240 hour(s))  Surgical pcr screen     Status: None   Collection Time: 04/25/17 11:31 PM  Result Value Ref Range Status   MRSA, PCR NEGATIVE NEGATIVE Final   Staphylococcus aureus NEGATIVE NEGATIVE Final    Comment: (NOTE) The Xpert SA Assay (FDA approved for NASAL specimens in patients 23 years of age and older), is one component of a comprehensive surveillance program. It is not intended to diagnose infection nor to guide or monitor treatment.       Studies: No results found.  Scheduled Meds: . enoxaparin (LOVENOX) injection  40 mg Subcutaneous Q24H    Continuous Infusions: . methocarbamol (ROBAXIN)  IV    . methocarbamol (ROBAXIN)  IV       LOS: 3 days     Kayleen Memos, MD [facility] Pager [phone]  If 7PM-7AM, please contact night-coverage [email or web address] Password [facility] 04/28/2017, 3:48 PM

## 2017-04-28 NOTE — Progress Notes (Signed)
   Subjective:  Patient reports pain as mild.  No events.  Objective:   VITALS:   Vitals:   04/27/17 0553 04/27/17 1300 04/27/17 2140 04/28/17 0514  BP: (!) 114/49 (!) 125/5 (!) 119/52 121/65  Pulse: 85 95 95 100  Resp: 16 16 16 16   Temp: 98.8 F (37.1 C) 98 F (36.7 C) 99.8 F (37.7 C) 99.3 F (37.4 C)  TempSrc: Oral Oral Oral Oral  SpO2: 98% 97% 94% 95%  Weight:  177 lb 0.5 oz (80.3 kg)    Height:  5\' 6"  (1.676 m)      Neurologically intact Neurovascular intact Sensation intact distally Intact pulses distally Dorsiflexion/Plantar flexion intact Incision: dressing C/D/I and no drainage No cellulitis present Compartment soft   Lab Results  Component Value Date   WBC 7.2 04/28/2017   HGB 8.9 (L) 04/28/2017   HCT 26.8 (L) 04/28/2017   MCV 89.9 04/28/2017   PLT 138 (L) 04/28/2017     Assessment/Plan:  2 Days Post-Op   - Expected postop acute blood loss anemia - will monitor for symptoms - Up with PT/OT - DVT ppx - SCDs, ambulation, lovenox - WBAT operative extremity - Pain control - Discharge planning - stable from ortho stand point  Eduard Roux 04/28/2017, 4:07 PM [phone]

## 2017-04-28 NOTE — NC FL2 (Signed)
[place] MEDICAID FL2 LEVEL OF CARE SCREENING TOOL     IDENTIFICATION  Patient Name: Shelby Hill Birthdate: 04/04/1936 Sex: female Admission Date (Current Location): 04/25/2017  [facility] and [place] Number:  Herbalist and Address:  [facility], [place] [address] [place], [place] [address]      Provider Number: [id number]  Attending Physician Name and Address:  Kayleen Memos, DO  Relative Name and Phone Number:       Current Level of Care: Hospital Recommended Level of Care: [place] Prior Approval Number:    Date Approved/Denied:   PASRR Number: [id number]  Discharge Plan: SNF    Current Diagnoses: Patient Active Problem List   Diagnosis Date Noted  . Displaced intertrochanteric fracture of left femur, initial encounter for closed fracture ([place]) 04/25/2017  . HLD (hyperlipidemia) 04/25/2017    Orientation RESPIRATION BLADDER Height & Weight     Self, Situation, Place, Time  Normal Continent Weight: 177 lb 0.5 oz (80.3 kg) Height:  5\' 6"  (167.6 cm)  BEHAVIORAL SYMPTOMS/MOOD NEUROLOGICAL BOWEL NUTRITION STATUS      Continent Diet(See DC Summary)  AMBULATORY STATUS COMMUNICATION OF NEEDS Skin   Extensive Assist Verbally Surgical wounds                       Personal Care Assistance Level of Assistance  Dressing, Bathing, Feeding Bathing Assistance: Maximum assistance Feeding assistance: Independent Dressing Assistance: Maximum assistance     Functional Limitations Info  Sight, Hearing, Speech Sight Info: Adequate Hearing Info: Adequate Speech Info: Adequate    SPECIAL CARE FACTORS FREQUENCY  OT (By licensed OT), PT (By licensed PT)     PT Frequency: 5x week OT Frequency: 5x week            Contractures Contractures Info: Not present    Additional Factors Info  Code Status, Allergies Code Status Info: Full Code Allergies Info: No Known Allergies           Current Medications  (04/28/2017):  This is the current hospital active medication list Current Facility-Administered Medications  Medication Dose Route Frequency Provider Last Rate Last Dose  . acetaminophen (TYLENOL) tablet 650 mg  650 mg Oral Q6H PRN Leandrew Koyanagi, MD       Or  . acetaminophen (TYLENOL) suppository 650 mg  650 mg Rectal Q6H PRN Leandrew Koyanagi, MD      . alum & mag hydroxide-simeth (MAALOX/MYLANTA) 200-200-20 MG/5ML suspension 30 mL  30 mL Oral Q4H PRN Leandrew Koyanagi, MD      . enoxaparin (LOVENOX) injection 40 mg  40 mg Subcutaneous Q24H Leandrew Koyanagi, MD   40 mg at 04/28/17 0919  . HYDROcodone-acetaminophen (NORCO/VICODIN) 5-325 MG per tablet 1-2 tablet  1-2 tablet Oral Q6H PRN Leandrew Koyanagi, MD   2 tablet at 04/28/17 0636  . menthol-cetylpyridinium (CEPACOL) lozenge 3 mg  1 lozenge Oral PRN Leandrew Koyanagi, MD       Or  . phenol (CHLORASEPTIC) mouth spray 1 spray  1 spray Mouth/Throat PRN Leandrew Koyanagi, MD      . methocarbamol (ROBAXIN) tablet 500 mg  500 mg Oral Q6H PRN Rise Patience, MD   500 mg at 04/28/17 6761   Or  . methocarbamol (ROBAXIN) 500 mg in dextrose 5 % 50 mL IVPB  500 mg Intravenous Q6H PRN Rise Patience, MD      . methocarbamol (ROBAXIN) tablet 500 mg  500 mg Oral Q6H PRN Leandrew Koyanagi, MD       Or  . methocarbamol (ROBAXIN) 500 mg in dextrose 5 % 50 mL IVPB  500 mg Intravenous Q6H PRN Leandrew Koyanagi, MD      . metoCLOPramide (REGLAN) tablet 5-10 mg  5-10 mg Oral Q8H PRN Leandrew Koyanagi, MD       Or  . metoCLOPramide (REGLAN) injection 5-10 mg  5-10 mg Intravenous Q8H PRN Leandrew Koyanagi, MD      . morphine 2 MG/ML injection 0.5 mg  0.5 mg Intravenous Q2H PRN Leandrew Koyanagi, MD   0.5 mg at 04/27/17 0932  . ondansetron (ZOFRAN) tablet 4 mg  4 mg Oral Q6H PRN Leandrew Koyanagi, MD       Or  . ondansetron [facility]) injection 4 mg  4 mg Intravenous Q6H PRN Leandrew Koyanagi, MD   4 mg at 04/27/17 0035  . oxyCODONE (Oxy IR/ROXICODONE) immediate release tablet 5-10 mg  5-10 mg Oral Q4H  PRN Leandrew Koyanagi, MD   10 mg at 04/26/17 1823     Discharge Medications: Please see discharge summary for a list of discharge medications.  Relevant Imaging Results:  Relevant Lab Results:   Additional Information Ss#: [id number] [place], LCSW

## 2017-04-28 NOTE — Progress Notes (Signed)
Physical Therapy Treatment Patient Details Name: Shelby Hill MRN: [id number] DOB: 1935/11/19 Today's Date: 04/28/2017    History of Present Illness Shelby Hill is a 81 yo CF with PMH HLD, anxiety who presented on 04/26/17 after a mechanical all at home.  Xray revealed left intertrochanteric fracture. s/p left intertrochanteric fracture repair.    PT Comments    Patient received supine in bed, very pleasant and willing to work with PT today; she reports that she would like to try getting to the chair. She required Min assist in general for supine to sit, mostly for guarding and support of her L LE, and Min-Mod assist for sit to stand with Mod verbal cues. Noted difficulty and provided cues for getting L foot flat on floor today, as patient tends to go to toe touch position while taking steps to transfer during gait. Attempted further standing activities after short rest break, however patient limited by fatigue and reports today's session as 5-6 RPE as a whole. Patient agreeable to attempting to stay up in chair for 2-3 hours today. Patient left in chair with all needs met and all questions/concerns addressed, family present in room.    Follow Up Recommendations  SNF     Equipment Recommendations  Other (comment)(defer to next venue )    Recommendations for Other Services       Precautions / Restrictions Precautions Precautions: Fall Restrictions Weight Bearing Restrictions: Yes LLE Weight Bearing: Weight bearing as tolerated    Mobility  Bed Mobility Overal bed mobility: Needs Assistance Bed Mobility: Supine to Sit     Supine to sit: Min assist     General bed mobility comments: min assist to manage L LE, cues for sequencing and safety   Transfers Overall transfer level: Needs assistance Equipment used: Rolling walker (2 wheeled) Transfers: Sit to/from [facility] Sit to Stand: Min assist Stand pivot transfers: Min assist       General transfer  comment: verbal cues for form, safety sequencing, correct technique with functional mobility; took 5-6 small steps to pivot R to chair  Ambulation/Gait                 Stairs            Wheelchair Mobility    Modified Rankin (Stroke Patients Only)       Balance Overall balance assessment: Needs assistance Sitting-balance support: Bilateral upper extremity supported Sitting balance-Leahy Scale: Good Sitting balance - Comments: sat EOB with general S, min verbal cues    Standing balance support: Bilateral upper extremity supported Standing balance-Leahy Scale: Fair Standing balance comment: reliant on RW                             Cognition Arousal/Alertness: Awake/alert Behavior During Therapy: WFL for tasks assessed/performed Overall Cognitive Status: Within Functional Limits for tasks assessed                                 General Comments: Pt really appreciates it if you explain movements before you do them, and GO SLOW. Pt is very affected by anxiety      Exercises General Exercises - Lower Extremity Ankle Circles/Pumps: Seated;Both;15 reps Long Arc Quad: Strengthening;Seated;10 reps;Both Hip ABduction/ADduction: Strengthening;Both;10 reps    General Comments General comments (skin integrity, edema, etc.): patient limited by fatigue this session; did become dizzy following SPT to chair  but this resolved with pursed lip breathing       Pertinent Vitals/Pain Pain Assessment: 0-10 Pain Score: 0-No pain Faces Pain Scale: No hurt Pain Intervention(s): Limited activity within patient's tolerance    Home Living                      Prior Function            PT Goals (current goals can now be found in the care plan section) Acute Rehab PT Goals Patient Stated Goal: to get stronger and decrease pain PT Goal Formulation: With patient Time For Goal Achievement: 05/04/17 Potential to Achieve Goals: Good Progress  towards PT goals: Progressing toward goals    Frequency    Min 3X/week      PT Plan Current plan remains appropriate    Co-evaluation              AM-PAC PT "6 Clicks" Daily Activity  Outcome Measure  Difficulty turning over in bed (including adjusting bedclothes, sheets and blankets)?: Unable Difficulty moving from lying on back to sitting on the side of the bed? : Unable Difficulty sitting down on and standing up from a chair with arms (e.g., wheelchair, bedside commode, etc,.)?: Unable Help needed moving to and from a bed to chair (including a wheelchair)?: A Little Help needed walking in hospital room?: A Lot Help needed climbing 3-5 steps with a railing? : A Lot 6 Click Score: 10    End of Session Equipment Utilized During Treatment: Gait belt Activity Tolerance: Patient tolerated treatment well Patient left: in chair;with family/visitor present;with call bell/phone within reach   PT Visit Diagnosis: Difficulty in walking, not elsewhere classified (R26.2);Pain Pain - Right/Left: Left Pain - part of body: Hip     Time: 1043-1110 PT Time Calculation (min) (ACUTE ONLY): 27 min  Charges:  $Therapeutic Exercise: 8-22 mins $Therapeutic Activity: 8-22 mins                    G Codes:       Shelby Hill PT, DPT, CBIS  Supplemental Physical Therapist [place]

## 2017-04-29 DIAGNOSIS — S72142A Displaced intertrochanteric fracture of left femur, initial encounter for closed fracture: Principal | ICD-10-CM

## 2017-04-29 DIAGNOSIS — E7800 Pure hypercholesterolemia, unspecified: Secondary | ICD-10-CM

## 2017-04-29 LAB — CBC
HCT: 25.4 % — ABNORMAL LOW (ref 36.0–46.0)
HEMOGLOBIN: 8.4 g/dL — AB (ref 12.0–15.0)
MCH: 29.9 pg (ref 26.0–34.0)
MCHC: 33.1 g/dL (ref 30.0–36.0)
MCV: 90.4 fL (ref 78.0–100.0)
Platelets: 151 10*3/uL (ref 150–400)
RBC: 2.81 MIL/uL — AB (ref 3.87–5.11)
RDW: 13.6 % (ref 11.5–15.5)
WBC: 5.5 10*3/uL (ref 4.0–10.5)

## 2017-04-29 MED ORDER — CEPHALEXIN 500 MG PO CAPS: 500.0000 mg | ORAL_CAPSULE | Freq: Two times a day (BID) | ORAL | 0 refills | Status: DC

## 2017-04-29 MED ORDER — ALPRAZOLAM 0.5 MG PO TABS: 0.2500 mg | ORAL_TABLET | Freq: Every evening | ORAL | 0 refills | Status: DC | PRN

## 2017-04-29 NOTE — Care Management Important Message (Signed)
Important Message  Patient Details  Name: Shelby Hill MRN: [id number] Date of Birth: Mar 07, 1936   Medicare Important Message Given:  Yes    Camron Essman 04/29/2017, 12:08 PM

## 2017-04-29 NOTE — Clinical Social Work Placement (Signed)
   CLINICAL SOCIAL WORK PLACEMENT  NOTE  Date:  04/29/2017  Patient Details  Name: Shelby Hill MRN: [id number] Date of Birth: 19-Jul-1935  Clinical Social Work is seeking post-discharge placement for this patient at the [place] level of care (*CSW will initial, date and re-position this form in  chart as items are completed):  Yes   Patient/family provided with [place] Work Department's list of facilities offering this level of care within the geographic area requested by the patient (or if unable, by the patient's family).  Yes   Patient/family informed of their freedom to choose among providers that offer the needed level of care, that participate in Medicare, Medicaid or managed care program needed by the patient, have an available bed and are willing to accept the patient.  Yes   Patient/family informed of [place]'s ownership interest in [facility] and [facility], as well as of the fact that they are under no obligation to receive care at these facilities.  PASRR submitted to EDS on       PASRR number received on 04/28/17     Existing PASRR number confirmed on       FL2 transmitted to all facilities in geographic area requested by pt/family on 04/28/17     FL2 transmitted to all facilities within larger geographic area on       Patient informed that his/her managed care company has contracts with or will negotiate with certain facilities, including the following:        Yes   Patient/family informed of bed offers received.  Patient chooses bed at [facility]     Physician recommends and patient chooses bed at      Patient to be [facility] on 04/29/17.  Patient to be transferred to facility by [facility]     Patient family notified on 04/28/17 of transfer.  Name of family member notified:  spouse advised     PHYSICIAN Please prepare prescriptions, Please prepare priority discharge  summary, including medications     Additional Comment:    _______________________________________________ Normajean Baxter, LCSW 04/29/2017, 10:01 AM

## 2017-04-29 NOTE — Discharge Summary (Signed)
Physician Discharge Summary  Canyon Willow MHD:[id number] DOB: 1935/09/27 DOA: 04/25/2017  PCP: No primary care provider on file.  Admit date: 04/25/2017 Discharge date: 04/29/2017  Admitted From: Home Disposition: SNF   Recommendations for Outpatient Follow-up:  1. Follow up with PCP in 1-2 weeks 2. Please obtain BMP/CBC in one week 3. Follow up with orthopedics in 2 weeks.  Home Health: N/A Equipment/Devices: Per SNF Discharge Condition: Stable CODE STATUS: Full Diet recommendation: Regular  Brief/Interim Summary: Ms. Shelby Hill is a 81 yo CF with PMH HLD, anxiety who presented on 04/26/17 after a mechanical all at home. Seen in [place] at [facility]. Xray revealed left intertrochanteric fracture. Patient transferred to [facility] for surgical intervention. Now s/p left IM nail by Dr Erlinda Hong on 12/1. Postoperative course has been uncomplicated.  Discharge Diagnoses:  Principal Problem:   Displaced intertrochanteric fracture of left femur, initial encounter for closed fracture [facility]) Active Problems:   HLD (hyperlipidemia)  Left intertrochanteric femur fracture following mechanical fall s/p IM nail 12/1 by Dr. Erlinda Hong: - Continue DVT ppx and pain control as prescribed by orthopedics (see below) - Continue PT/OT at SNF  Normocytic acute blood loss anemia:  - Monitor CBC in 1 week or prn signs of bleeding. No expanding postoperative hematoma or other evidence of bleeding.   HLD - Continue statin   Anxiety - Continue xanax  Hypokalemia: Was given supplement.  - Recheck BMP in 1 week.  Stye: No clinical evidence at this time.  - Was prescribed keflex as outpatient which will continue until final of 10 day duration (last dose 12/5 PM).  - Restart maxitrol ointment if needed  Discharge Instructions Discharge Instructions    Increase activity slowly   Complete by:  As directed    Weight bearing as tolerated   Complete by:  As directed      Allergies as of  04/29/2017   No Known Allergies     Medication List    STOP taking these medications   neomycin-polymyxin-dexameth 0.1 % Oint Commonly known as:  MAXITROL     TAKE these medications   acetaminophen 500 MG tablet Commonly known as:  TYLENOL Take 500 mg by mouth every 6 (six) hours as needed for headache (pain).   ALPRAZolam 0.5 MG tablet Commonly known as:  XANAX Take 0.5 tablets (0.25 mg total) by mouth at bedtime as needed for anxiety or sleep.   cephALEXin 500 MG capsule Commonly known as:  KEFLEX Take 1 capsule (500 mg total) by mouth 2 (two) times daily. 10 day course started 04/21/17 (for eye infection)   enoxaparin 40 MG/0.4ML injection Commonly known as:  LOVENOX Inject 0.4 mLs (40 mg total) into the skin daily.   oxyCODONE-acetaminophen 5-325 MG tablet Commonly known as:  PERCOCET Take 1-2 tablets by mouth every 4 (four) hours as needed for severe pain.   pravastatin 20 MG tablet Commonly known as:  PRAVACHOL Take 20 mg by mouth daily.            Discharge Care Instructions  (From admission, onward)        Start     Ordered   04/26/17 0000  Weight bearing as tolerated     04/26/17 0958     Contact information for after-discharge care    [facility] Follow up.   Service:  Skilled Nursing Contact information: [address] [place] [phone]  No Known Allergies  Consultations:  Orthopedics  Procedures/Studies: Dg C-arm 1-60 Min  Result Date: 04/26/2017 CLINICAL DATA:  Internal fixation of LEFT femur EXAM: DG C-ARM 61-120 MIN; OPERATIVE LEFT HIP WITH PELVIS COMPARISON:  None. FINDINGS: Intramedullary nail fixation of LEFT intertrochanteric femur fracture. Two dynamic compression screws are present. IMPRESSION: Intramedullary nail fixation of intertrochanteric fracture. Electronically Signed   By: Suzy Bouchard M.D.   On: 04/26/2017 10:15   Dg Hip Operative  Unilat W Or W/o Pelvis Left  Result Date: 04/26/2017 CLINICAL DATA:  Internal fixation of LEFT femur EXAM: DG C-ARM 61-120 MIN; OPERATIVE LEFT HIP WITH PELVIS COMPARISON:  None. FINDINGS: Intramedullary nail fixation of LEFT intertrochanteric femur fracture. Two dynamic compression screws are present. IMPRESSION: Intramedullary nail fixation of intertrochanteric fracture. Electronically Signed   By: Suzy Bouchard M.D.   On: 04/26/2017 10:15   Subjective: Pain controlled, ready for rehabilitation efforts. No other complaints.   Discharge Exam: Vitals:   04/28/17 2235 04/29/17 0655  BP: 110/71 124/65  Pulse: 94 89  Resp: 16 16  Temp: 98.9 F (37.2 C) 98.6 F (37 C)  SpO2: 94% 95%   General: Pt is alert, awake, not in acute distress Cardiovascular: RRR, S1/S2 +, no rubs, no gallops Respiratory: CTA bilaterally, no wheezing, no rhonchi Abdominal: Soft, NT, ND, bowel sounds + Extremities: LLE dressing c/d/i with soft compartments, no significant hematoma. Full AROM, no sensory deficit. Cap refill brisk.   Labs: Basic Metabolic Panel: Recent Labs  Lab 04/25/17 2114 04/26/17 1319 04/27/17 0552 04/28/17 0456  NA 138  --  136 137  K 4.0  --  3.6 3.3*  CL 107  --  106 106  CO2 26  --  26 26  GLUCOSE 142*  --  125* 98  BUN 15  --  8 8  CREATININE 0.68 0.66 0.67 0.59  CALCIUM 8.8*  --  8.1* 8.1*   Liver Function Tests: Recent Labs  Lab 04/25/17 2114  AST 18  ALT 14  ALKPHOS 59  BILITOT 1.0  PROT 6.1*  ALBUMIN 3.3*   CBC: Recent Labs  Lab 04/25/17 2114 04/26/17 1319 04/27/17 0552 04/28/17 0456 04/29/17 0548  WBC 10.5 9.2 7.2 7.2 5.5  NEUTROABS 9.0*  --   --   --   --   HGB 11.7* 10.4* 9.8* 8.9* 8.4*  HCT 35.3* 31.6* 29.8* 26.8* 25.4*  MCV 89.4 90.3 90.6 89.9 90.4  PLT 176 151 151 138* 151   Microbiology Recent Results (from the past 240 hour(s))  Surgical pcr screen     Status: None   Collection Time: 04/25/17 11:31 PM  Result Value Ref Range Status    MRSA, PCR NEGATIVE NEGATIVE Final   Staphylococcus aureus NEGATIVE NEGATIVE Final    Comment: (NOTE) The Xpert SA Assay (FDA approved for NASAL specimens in patients 21 years of age and older), is one component of a comprehensive surveillance program. It is not intended to diagnose infection nor to guide or monitor treatment.     Time coordinating discharge: Approximately 40 minutes  Vance Gather, MD  [facility] 04/29/2017, 9:08 AM Pager [phone]

## 2017-04-29 NOTE — Progress Notes (Signed)
Report called to Ronnie Derby, LPN at [facility].  [facility] at bedside to transport patient. IV removed

## 2017-04-29 NOTE — Social Work (Signed)
Clinical Social Worker facilitated patient discharge including contacting patient family and facility to confirm patient discharge plans.  Clinical information faxed to facility and family agreeable with plan.    CSW arranged ambulance transport via [facility] to [facility] .    RN to call [phone] to give report prior to discharge.  Clinical Social Worker will sign off for now as social work intervention is no longer needed. Please consult [place] again if new need arises.  Elissa Hefty, LCSW Clinical Social Worker [phone]

## 2017-04-29 NOTE — Progress Notes (Signed)
Physical Therapy Treatment Patient Details Name: Shelby Hill MRN: [id number] DOB: [birth year] Today's Date: 04/29/2017    History of Present Illness Ms. Kryder is a 81 yo CF with PMH HLD, anxiety who presented on 04/26/17 after a mechanical all at home.  Xray revealed left intertrochanteric fracture. s/p left intertrochanteric fracture repair.    PT Comments    Patient received in bed, pleasant and willing to work with skilled PT services, seeming to display some confusion between PT and nursing staff however. Performed functional exercises in bed, noting significant muscle weakness, then proceeded with functional mobility training today including bed mobility and transfer to chair. Attempted very short gait distance in room (approximately 2 feet forward and backward) with min assist provided for sequencing and Mod cues for technique today, limited by fatigue. Patient appears to be slowly progressing with skilled PT services, discharge recommendation remains appropriate. Patient left up in chair with all needs met, all questions/concerns addressed, and MD in room attending to patient.      Follow Up Recommendations  SNF     Equipment Recommendations  None recommended by PT    Recommendations for Other Services       Precautions / Restrictions Precautions Precautions: Fall Restrictions Weight Bearing Restrictions: Yes LLE Weight Bearing: Weight bearing as tolerated    Mobility  Bed Mobility   Bed Mobility: Supine to Sit     Supine to sit: Min assist     General bed mobility comments: min assist to manage L LE, cues for sequencing and safety   Transfers Overall transfer level: Needs assistance Equipment used: Rolling walker (2 wheeled) Transfers: Stand Pivot Transfers;Sit to/from Stand Sit to Stand: Mod assist(from low surface ) Stand pivot transfers: Min guard       General transfer comment: verbal cues for form, safety sequencing, correct technique with functional  mobility; took 5-6 small steps to pivot R to chair  Ambulation/Gait Ambulation/Gait assistance: Min assist Ambulation Distance (Feet): 2 Feet Assistive device: Rolling walker (2 wheeled) Gait Pattern/deviations: Step-to pattern;Decreased step length - right;Decreased stance time - left;Decreased weight shift to left;Antalgic     General Gait Details: limited by pain and fatigue; 2 feet forward and backward in front of chair    Stairs            Wheelchair Mobility    Modified Rankin (Stroke Patients Only)       Balance                                            Cognition Arousal/Alertness: Awake/alert Behavior During Therapy: WFL for tasks assessed/performed Overall Cognitive Status: Within Functional Limits for tasks assessed                                        Exercises General Exercises - Lower Extremity Long Arc Quad: Left;5 reps;Seated Heel Slides: Left;10 reps;Supine Hip ABduction/ADduction: Left;10 reps;Supine Straight Leg Raises: Left;5 reps;Other (comment);Supine(approx 3-4 inches above bed)    General Comments        Pertinent Vitals/Pain Pain Assessment: No/denies pain Pain Intervention(s): Limited activity within patient's tolerance    Home Living                      Prior Function  PT Goals (current goals can now be found in the care plan section) Acute Rehab PT Goals Patient Stated Goal: to get stronger and decrease pain PT Goal Formulation: With patient Time For Goal Achievement: 05/04/17 Potential to Achieve Goals: Good Progress towards PT goals: Progressing toward goals    Frequency    Min 3X/week      PT Plan Current plan remains appropriate    Co-evaluation              AM-PAC PT "6 Clicks" Daily Activity  Outcome Measure  Difficulty turning over in bed (including adjusting bedclothes, sheets and blankets)?: Unable Difficulty moving from lying on back to  sitting on the side of the bed? : Unable Difficulty sitting down on and standing up from a chair with arms (e.g., wheelchair, bedside commode, etc,.)?: Unable Help needed moving to and from a bed to chair (including a wheelchair)?: A Little Help needed walking in hospital room?: A Lot Help needed climbing 3-5 steps with a railing? : A Lot 6 Click Score: 10    End of Session Equipment Utilized During Treatment: Gait belt Activity Tolerance: Patient tolerated treatment well;Patient limited by fatigue Patient left: in chair;with call bell/phone within reach;Other (comment)(MD in room attending to patient )   PT Visit Diagnosis: Difficulty in walking, not elsewhere classified (R26.2);Pain Pain - Right/Left: Left Pain - part of body: Hip     Time: 0941-7919 PT Time Calculation (min) (ACUTE ONLY): 23 min  Charges:  $Therapeutic Exercise: 8-22 mins $Therapeutic Activity: 8-22 mins                    G Codes:       Deniece Ree PT, DPT, CBIS  Supplemental Physical Therapist [place]

## 2017-05-01 ENCOUNTER — Telehealth (INDEPENDENT_AMBULATORY_CARE_PROVIDER_SITE_OTHER): Payer: Self-pay | Admitting: Radiology

## 2017-05-01 NOTE — Telephone Encounter (Signed)
Patient's son came in to [place] office (pt is in rehab facility here) wanting to know when his mom could shower.  CB [phone]  Please advise.  Thanks.

## 2017-05-01 NOTE — Telephone Encounter (Signed)
May get incision wet with shower in 7 days after surgery

## 2017-05-01 NOTE — Telephone Encounter (Signed)
I called and discussed. I also made follow up appt for patient.

## 2017-05-09 ENCOUNTER — Encounter (INDEPENDENT_AMBULATORY_CARE_PROVIDER_SITE_OTHER): Payer: Self-pay | Admitting: Orthopaedic Surgery

## 2017-05-09 ENCOUNTER — Ambulatory Visit (INDEPENDENT_AMBULATORY_CARE_PROVIDER_SITE_OTHER): Payer: Medicare Other | Admitting: Orthopaedic Surgery

## 2017-05-09 ENCOUNTER — Ambulatory Visit (INDEPENDENT_AMBULATORY_CARE_PROVIDER_SITE_OTHER): Payer: Medicare Other

## 2017-05-09 DIAGNOSIS — S72142A Displaced intertrochanteric fracture of left femur, initial encounter for closed fracture: Secondary | ICD-10-CM

## 2017-05-09 NOTE — Progress Notes (Signed)
Patient is two-week status post intramedullary fixation of the intertrochanteric hip fracture.  She is progressing at her rehab facility.  Her pain is well controlled.  Her incisions have healed without signs of infection.  X-rays show stable fixation.  At this point she is stable from a orthopedic standpoint to be discharged home with home health physical therapy for continued rehabilitation and strengthening.  I would like to see her back in 4 weeks with repeat 2 view x-rays of the left hip.  Questions encouraged and answered.

## 2017-05-13 ENCOUNTER — Telehealth (INDEPENDENT_AMBULATORY_CARE_PROVIDER_SITE_OTHER): Payer: Self-pay | Admitting: Orthopaedic Surgery

## 2017-05-13 NOTE — Telephone Encounter (Signed)
Shelby Hill, [place], left a message requesting VO for the following:  3x a week for 2 weeks 2x a week for 3 weeks  She also wanted to let you know that the patient had Orthostatic Hypertension 92/62 and felt like she was going to pass out during PT.  They were able to get her in the bed and hydrated.  Also she is wanting to know if the patient can start taking Aspirin again.  CB#[phone].  Thank you.

## 2017-05-14 ENCOUNTER — Telehealth (INDEPENDENT_AMBULATORY_CARE_PROVIDER_SITE_OTHER): Payer: Self-pay | Admitting: Orthopaedic Surgery

## 2017-05-14 NOTE — Telephone Encounter (Signed)
Yes to PT.  Yes to aspirin.  If she continues to have the orthostatic hypotension then she needs to see her regular doctor

## 2017-05-14 NOTE — Telephone Encounter (Signed)
Called to advise on orders.

## 2017-05-14 NOTE — Telephone Encounter (Signed)
Message sent in error

## 2017-05-14 NOTE — Telephone Encounter (Signed)
Please advise on all parts of message thank you.

## 2017-06-06 ENCOUNTER — Encounter (INDEPENDENT_AMBULATORY_CARE_PROVIDER_SITE_OTHER): Payer: Self-pay | Admitting: Orthopaedic Surgery

## 2017-06-06 ENCOUNTER — Ambulatory Visit (INDEPENDENT_AMBULATORY_CARE_PROVIDER_SITE_OTHER): Payer: Medicare Other

## 2017-06-06 ENCOUNTER — Ambulatory Visit (INDEPENDENT_AMBULATORY_CARE_PROVIDER_SITE_OTHER): Payer: Medicare Other | Admitting: Orthopaedic Surgery

## 2017-06-06 DIAGNOSIS — S72142D Displaced intertrochanteric fracture of left femur, subsequent encounter for closed fracture with routine healing: Secondary | ICD-10-CM | POA: Diagnosis not present

## 2017-06-06 NOTE — Progress Notes (Signed)
Patient is 6 weeks status post intramedullary fixation of intertrochanteric hip fracture.  She is progressing with physical overall she is improving.  Pain is improving.  Healed.  X-rays show a patient with expected.  Overall she is doing well for 6-week follow-up.  I will see her back in another 6 weeks with 2 view x-rays of the left hip.  Physiologic settling of the fracture continue with home health physical therapy.  Prescription for left and she has a slight leg length discrepancy.

## 2017-06-20 ENCOUNTER — Telehealth (INDEPENDENT_AMBULATORY_CARE_PROVIDER_SITE_OTHER): Payer: Self-pay | Admitting: Orthopaedic Surgery

## 2017-06-20 NOTE — Telephone Encounter (Signed)
Shelby Hill, PT, from [facility] called stating that they are discharging her from home healthcare today and the patient would like to be referred to Outpatient therapy.  She would like to go to [facility] in [place] starting on February 5.  Patient would also like to know when she can start driving.  The PT's CB#[phone].  Patient's CB#[phone].  Thank you.

## 2017-06-20 NOTE — Telephone Encounter (Signed)
IC patient and advised Trinika Cortese drive.  I will enter the PT order Monday, her husband goes to [facility] for PT.

## 2017-06-20 NOTE — Telephone Encounter (Signed)
Ok to refer to PT as requested?  Please advise on driving?

## 2017-06-20 NOTE — Telephone Encounter (Signed)
May drive.  Refer to [facility] in [place].  Eval and treat s/p hip fracture.  Gait training, strengthening, balance.

## 2017-06-24 NOTE — Telephone Encounter (Signed)
I have faxed PT order to [facility] at patient's request, patient's husband is a patient there already and she can coordinate her visits with his for transportation.

## 2017-07-18 ENCOUNTER — Encounter (INDEPENDENT_AMBULATORY_CARE_PROVIDER_SITE_OTHER): Payer: Self-pay | Admitting: Orthopaedic Surgery

## 2017-07-18 ENCOUNTER — Ambulatory Visit (INDEPENDENT_AMBULATORY_CARE_PROVIDER_SITE_OTHER): Payer: Medicare Other | Admitting: Orthopaedic Surgery

## 2017-07-18 ENCOUNTER — Ambulatory Visit (INDEPENDENT_AMBULATORY_CARE_PROVIDER_SITE_OTHER): Payer: Medicare Other

## 2017-07-18 DIAGNOSIS — M25552 Pain in left hip: Secondary | ICD-10-CM

## 2017-07-18 DIAGNOSIS — S72142D Displaced intertrochanteric fracture of left femur, subsequent encounter for closed fracture with routine healing: Secondary | ICD-10-CM

## 2017-07-18 NOTE — Progress Notes (Signed)
Patient is almost 3 months status post left intertrochanteric hip fracture.  She is doing physical therapy 3 times a week.  Her left knee is feeling much better.  Surgical scars are fully healed.  X-rays show stable fixation and evidence of continued healing.  At this point I encouraged her to continue with physical therapy for strengthening.  She has a heel lift.  Follow-up in 2 months with 2 view x-rays left hip.

## 2017-09-10 ENCOUNTER — Other Ambulatory Visit: Payer: Self-pay | Admitting: Dermatology

## 2017-09-22 ENCOUNTER — Ambulatory Visit (INDEPENDENT_AMBULATORY_CARE_PROVIDER_SITE_OTHER): Payer: Medicare Other | Admitting: Orthopaedic Surgery

## 2017-10-06 ENCOUNTER — Ambulatory Visit (INDEPENDENT_AMBULATORY_CARE_PROVIDER_SITE_OTHER): Payer: Medicare Other | Admitting: Orthopaedic Surgery

## 2017-10-07 ENCOUNTER — Encounter (INDEPENDENT_AMBULATORY_CARE_PROVIDER_SITE_OTHER): Payer: Self-pay | Admitting: Orthopaedic Surgery

## 2017-10-07 ENCOUNTER — Ambulatory Visit (INDEPENDENT_AMBULATORY_CARE_PROVIDER_SITE_OTHER): Payer: Medicare Other | Admitting: Orthopaedic Surgery

## 2017-10-07 ENCOUNTER — Ambulatory Visit (INDEPENDENT_AMBULATORY_CARE_PROVIDER_SITE_OTHER): Payer: Medicare Other

## 2017-10-07 DIAGNOSIS — S72142D Displaced intertrochanteric fracture of left femur, subsequent encounter for closed fracture with routine healing: Secondary | ICD-10-CM

## 2017-10-07 NOTE — Progress Notes (Signed)
   Office Visit Note   Patient: Shelby Hill           Date of Birth: May 21, 1936           MRN: [id number] Visit Date: 10/07/2017              Requested by: Glenda Chroman, MD [place], [place] [id number] PCP: Glenda Chroman, MD   Assessment & Plan: Visit Diagnoses:  1. Closed displaced intertrochanteric fracture of left femur with routine healing     Plan: Patient is demonstrating appropriate healing and progress status post hip fracture 5 months ago.  From my standpoint I think she is doing very well.  Her fracture has healed tremendously.  Everything looks stable to me.  I would like to see her back in 3 more months with 2 view x-rays of the left hip.  Follow-Up Instructions: Return in about 3 months (around 01/07/2018).   Orders:  Orders Placed This Encounter  Procedures  . XR HIP UNILAT W OR W/O PELVIS 2-3 VIEWS LEFT   No orders of the defined types were placed in this encounter.     Procedures: No procedures performed   Clinical Data: No additional findings.   Subjective: Chief Complaint  Patient presents with  . Left Hip - Follow-up    04/26/17 IM nail intertrochanteric    [name] is a 82 year old female who is 5 months status post intramedullary fixation of intertrochanteric hip fracture.  She comes in today for follow-up.  Overall she is doing better.  She is working with physical therapy aggressively.  She is wearing a heel lift.   Review of Systems   Objective: Vital Signs: There were no vitals taken for this visit.  Physical Exam  Ortho Exam Overall her surgical scars are healed.  She is ambulating with a slight limp without any assistive devices.  She denies any hip pain. Specialty Comments:  No specialty comments available.  Imaging: Xr Hip Unilat W Or W/o Pelvis 2-3 Views Left  Result Date: 10/07/2017 Stable fixation with bony consolidation and callus formation    PMFS History: Patient Active Problem List   Diagnosis Date Noted  .  Displaced intertrochanteric fracture of left femur, initial encounter for closed fracture ([place]) 04/25/2017  . HLD (hyperlipidemia) 04/25/2017   Past Medical History:  Diagnosis Date  . HLD (hyperlipidemia)     Family History  Problem Relation Age of Onset  . CAD Mother   . Stroke Sister   . Stroke Brother     Past Surgical History:  Procedure Laterality Date  . COLONOSCOPY  01/01/2012   Procedure: COLONOSCOPY;  Surgeon: Rogene Houston, MD;  [facility];  Service: Endoscopy;  Laterality: N/A;  830  . INTRAMEDULLARY (IM) NAIL INTERTROCHANTERIC Left 04/26/2017   Procedure: INTRAMEDULLARY (IM) NAIL INTERTROCHANTRIC;  Surgeon: Leandrew Koyanagi, MD;  Location: [facility];  Service: Orthopedics;  Laterality: Left;  . KNEE SURGERY    . ORIF TIBIA PLATEAU     Social History   Occupational History  . Not on file  Tobacco Use  . Smoking status: Never Smoker  . Smokeless tobacco: Never Used  Substance and Sexual Activity  . Alcohol use: Yes    Comment: Occasionally  . Drug use: No  . Sexual activity: Not on file

## 2018-01-01 ENCOUNTER — Telehealth (INDEPENDENT_AMBULATORY_CARE_PROVIDER_SITE_OTHER): Payer: Self-pay

## 2018-01-01 ENCOUNTER — Telehealth (INDEPENDENT_AMBULATORY_CARE_PROVIDER_SITE_OTHER): Payer: Self-pay | Admitting: Orthopaedic Surgery

## 2018-01-01 NOTE — Telephone Encounter (Signed)
error 

## 2018-01-01 NOTE — Telephone Encounter (Signed)
Called patient no answer. RX for PT faxed to [facility] in [place] Fax number [phone]

## 2018-01-01 NOTE — Telephone Encounter (Signed)
See message.

## 2018-01-01 NOTE — Telephone Encounter (Signed)
This may definitely help. Ok to write PT rx

## 2018-01-01 NOTE — Telephone Encounter (Signed)
Patient called stating her right hip and lower back is hurting and it started hurting 2 weeks ago. Patient asked if Dr Erlinda Hong think it would help if she went back to [facility] for (PT) She spoke with Elta Gu[place]oupe and Dianna at [facility]. Patient said they would treat her for balance and left hip mobility. The number to contact patient is [phone]

## 2018-02-26 ENCOUNTER — Other Ambulatory Visit: Payer: Self-pay | Admitting: Internal Medicine

## 2018-02-26 DIAGNOSIS — Z1231 Encounter for screening mammogram for malignant neoplasm of breast: Secondary | ICD-10-CM

## 2018-04-07 ENCOUNTER — Ambulatory Visit
Admission: RE | Admit: 2018-04-07 | Discharge: 2018-04-07 | Disposition: A | Discharge status: Home / Self Care | Payer: Medicare Other | Source: Ambulatory Visit | Attending: Internal Medicine | Admitting: Internal Medicine

## 2018-04-07 DIAGNOSIS — Z1231 Encounter for screening mammogram for malignant neoplasm of breast: Secondary | ICD-10-CM

## 2018-04-07 IMAGING — MG DIGITAL SCREENING BILATERAL MAMMOGRAM WITH TOMO AND CAD
8 series · 9 of 24 positions shown · non-contrast
Comparison: Previous exam(s).

CLINICAL DATA: Screening.

EXAM:
DIGITAL SCREENING BILATERAL MAMMOGRAM WITH TOMO AND CAD

[R MLO synth-2D]
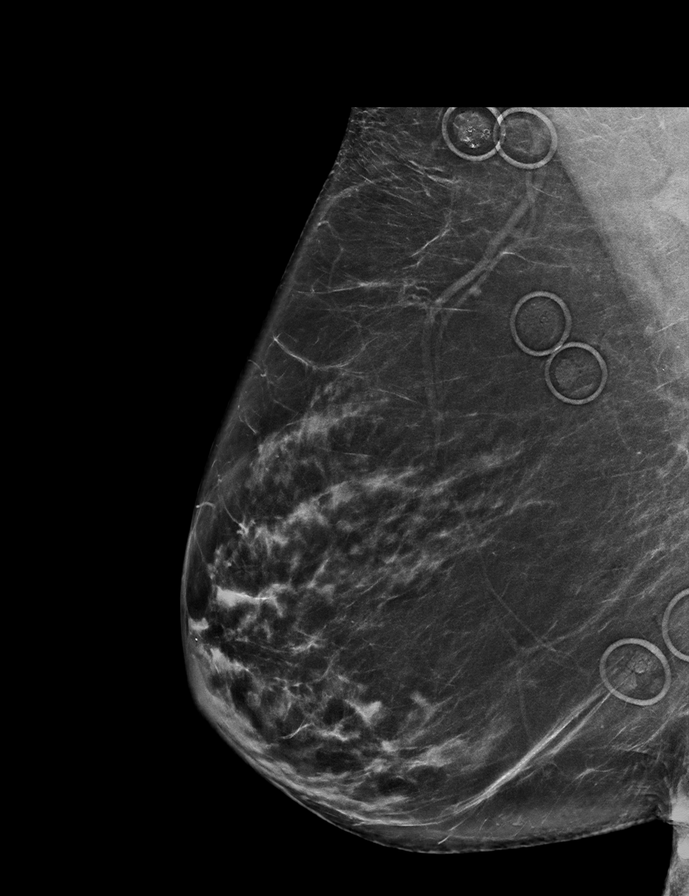

[L CC synth-2D]
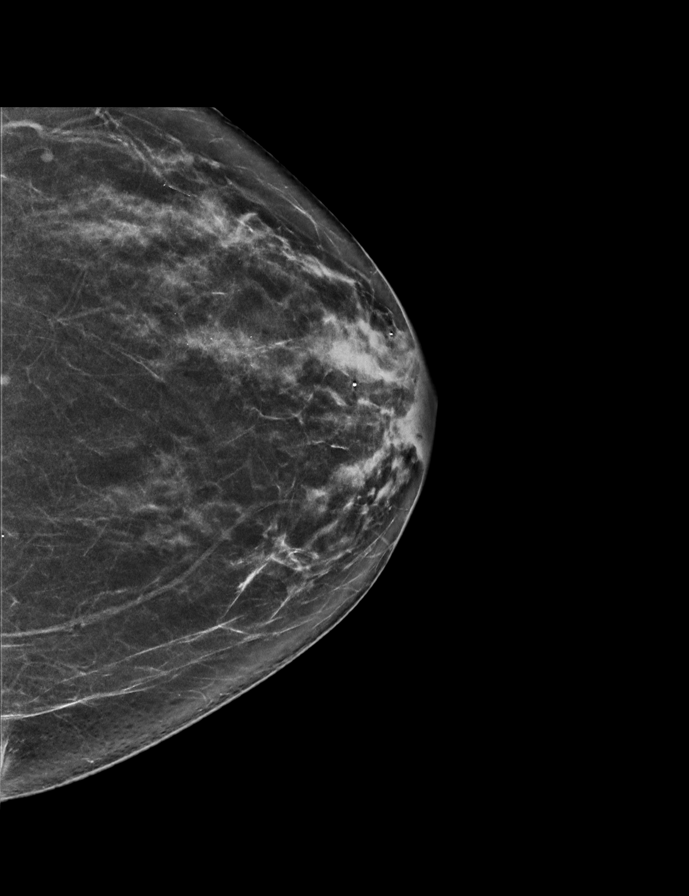

[L MLO synth-2D]
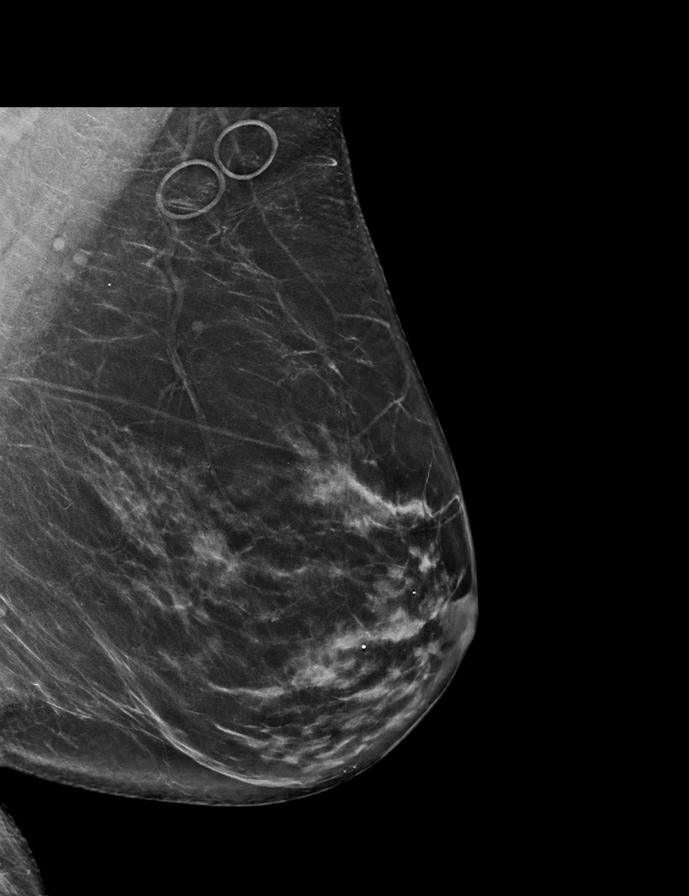

[R CC synth-2D]
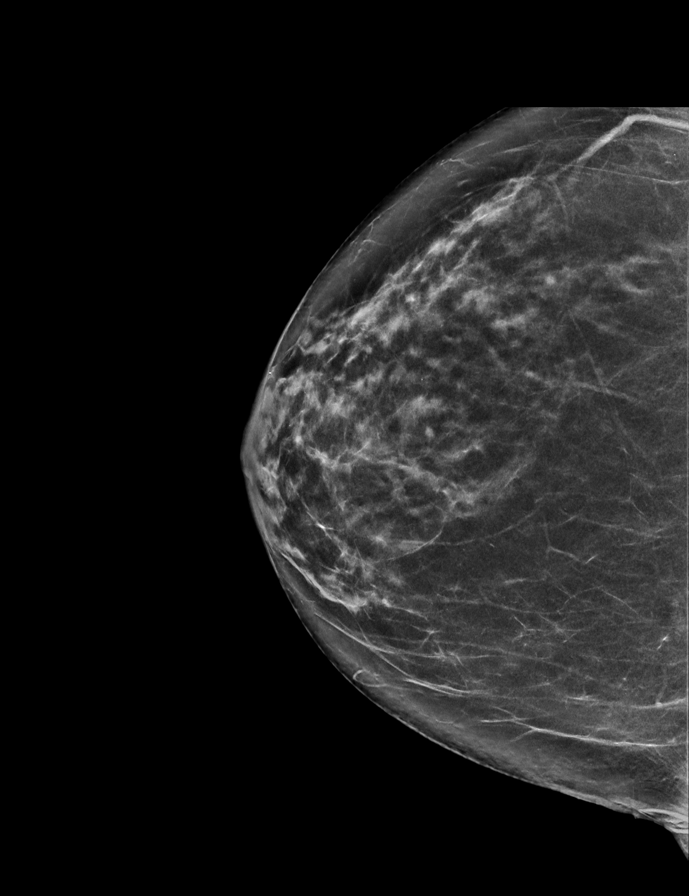

[L MLO tomo · 2 of 74 frames shown]
[frame 24/74]
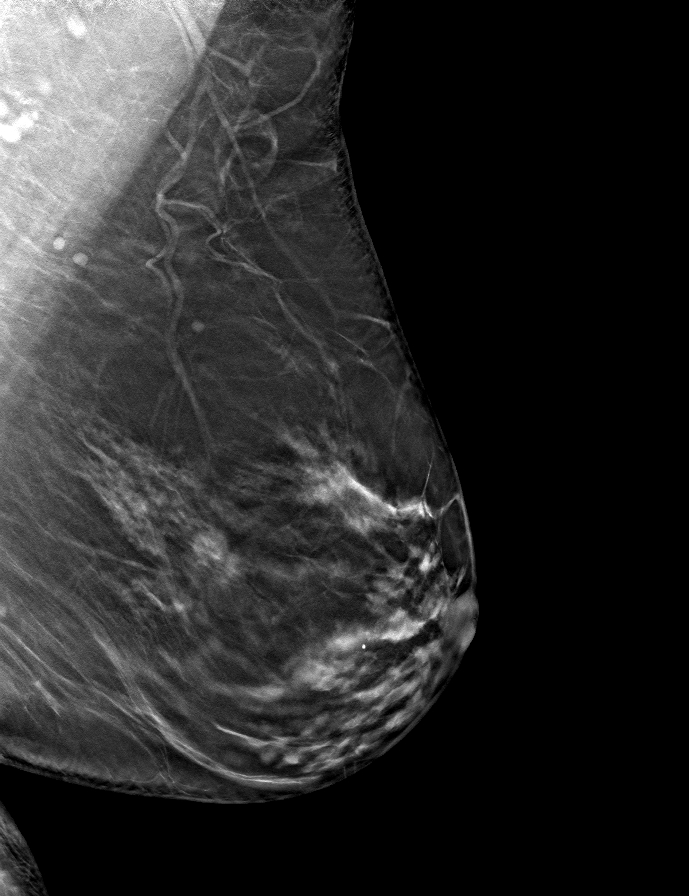
[frame 37/74]
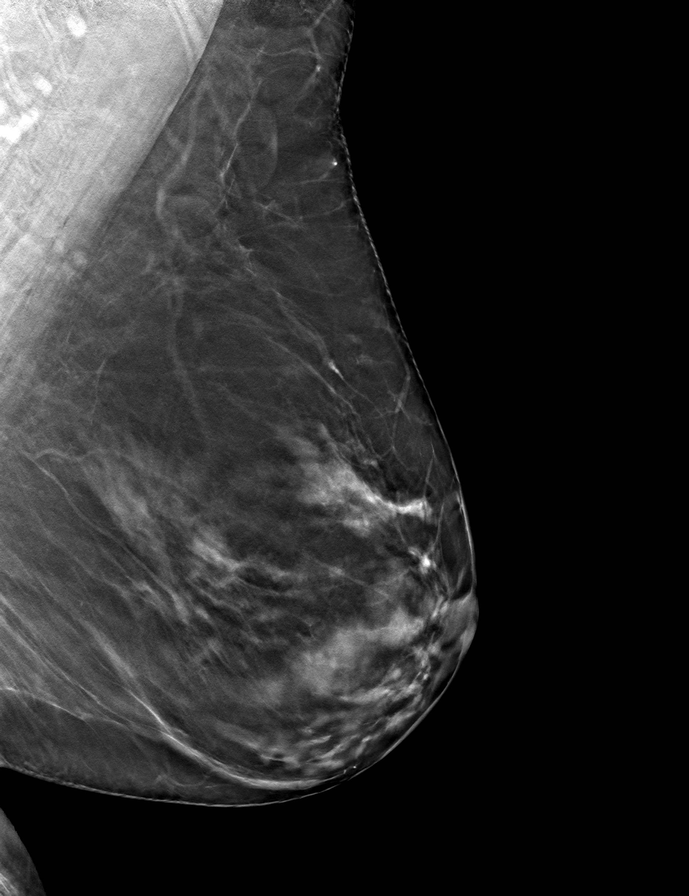

[R CC tomo · tomo slice 36/71.0]
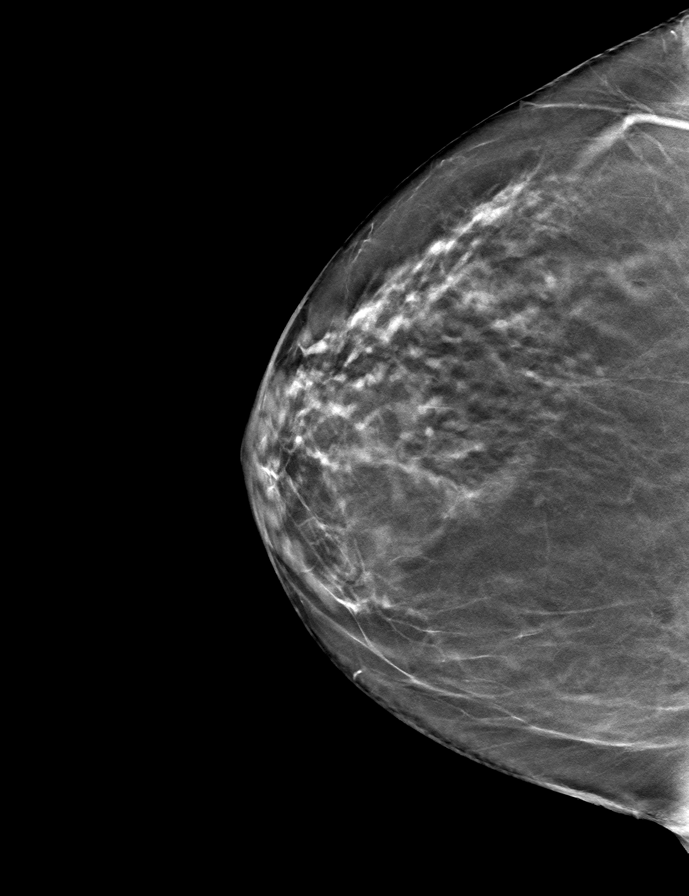

[R MLO tomo · tomo slice 39/78.0]
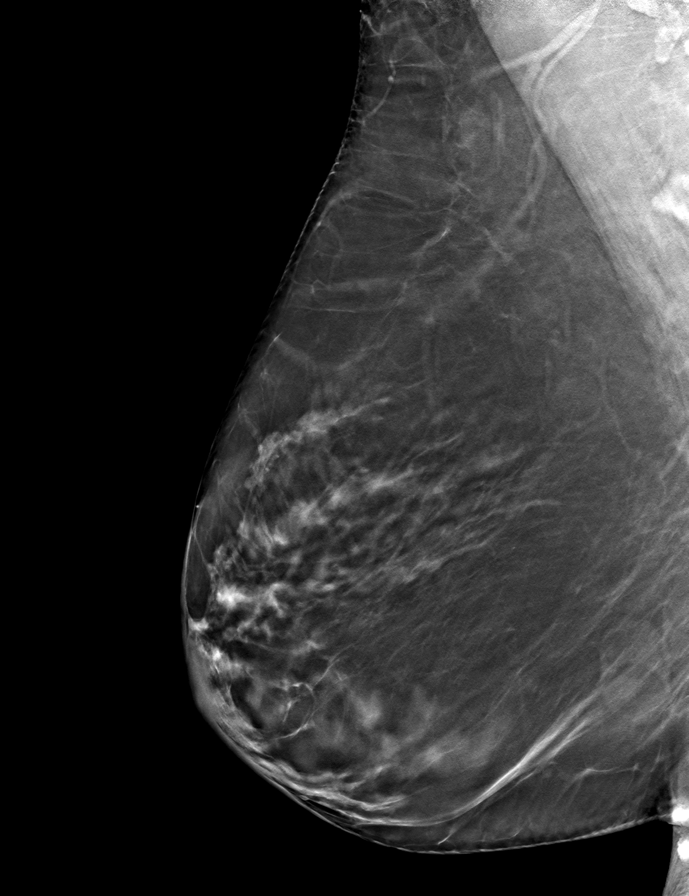

[L CC tomo · tomo slice 34/67.0]
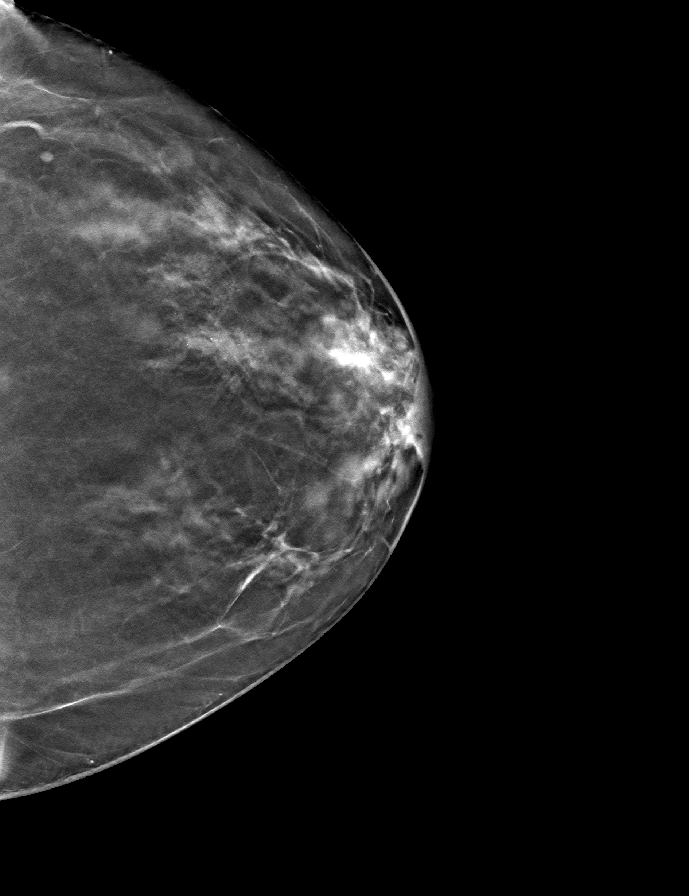

[9 of 24 positions shown; findings below may reference images not displayed]

ACR Breast Density Category b: There are scattered areas of
fibroglandular density.
FINDINGS: There are no findings suspicious for malignancy. Images were
processed with CAD.
IMPRESSION: No mammographic evidence of malignancy. A result letter of this
screening mammogram will be mailed directly to the patient.

RECOMMENDATION:
Screening mammogram in one year. (Code:[TQ])

BI-RADS CATEGORY  1: Negative.

## 2018-11-25 ENCOUNTER — Encounter: Payer: Self-pay | Admitting: *Deleted

## 2018-12-09 ENCOUNTER — Other Ambulatory Visit: Payer: Self-pay

## 2018-12-09 ENCOUNTER — Ambulatory Visit: Payer: Medicare Other | Admitting: Neurology

## 2018-12-09 ENCOUNTER — Encounter: Payer: Self-pay | Admitting: Neurology

## 2018-12-09 ENCOUNTER — Telehealth: Payer: Self-pay | Admitting: Neurology

## 2018-12-09 VITALS — BP 139/81 | HR 83 | Temp 98.0°F | Ht 66.0 in | Wt 163.0 lb

## 2018-12-09 DIAGNOSIS — G4489 Other headache syndrome: Secondary | ICD-10-CM

## 2018-12-09 MED ORDER — ALPRAZOLAM 0.5 MG PO TABS: ORAL_TABLET | ORAL | 0 refills | Status: DC

## 2018-12-09 MED ORDER — PREDNISONE 5 MG PO TABS: ORAL_TABLET | ORAL | 0 refills | Status: DC

## 2018-12-09 MED ORDER — CARBAMAZEPINE 200 MG PO TABS: ORAL_TABLET | ORAL | 3 refills | Status: DC

## 2018-12-09 NOTE — Progress Notes (Signed)
Reason for visit: Left facial pain  Referring physician: Dr. Nona Dell is a 83 y.o. female  History of present illness:  Ms. Uriostegui is an 83 year old right-handed white female with a history of left-sided facial pain that began in January 2020.  The patient may have had a single brief episode of severe pain about 18 months ago, but she did not have any recurrence of this until January.  The patient had pain until mid March and then the pain went away completely until June 2020.  The pain is now back and is daily in nature with frequent jabs of pain throughout the day, fortunately at nighttime when she goes to sleep the pain goes away.  The patient indicates that the pain initially started in the left frontal area and around the left eye, but the pain has spread to the entirety of the left face, she has hypersensitivity to touch around the left eye, on the nose, and she has pain with talking and chewing and swallowing.  She denies any numbness of the face, she denies any numbness or weakness of the arms or legs.  She denies any balance problems or difficulty controlling the bowels or the bladder.  She has developed tearing of the left eye and droopiness of the left eye.  She denies any double vision.  She has not had any scanning procedures of the brain, she claimed that she is severely claustrophobic.  She comes here for further evaluation.  She is on Lyrica taking 100 mg twice daily without significant benefit.  Past Medical History:  Diagnosis Date  . BCC (basal cell carcinoma) 02/09/1988   left upper thigh,left lower thigh  . BCC (basal cell carcinoma) 10/04/1962   left clavicle,left breast, Back  . BCC (basal cell carcinoma) 03/06/1989   mid back  . BCC (basal cell carcinoma) 04/02/1990   left upper shoulder (CX35FU),left upper breast(CX35FU),above left clavicle, right outer back,  . BCC (basal cell carcinoma) 04/01/1991   right cetner outer brown (CX3+exc. ), upper left  chest,upper left chest medial, right back  . BCC (basal cell carcinoma) 02/01/1994   right back  . BCC (basal cell carcinoma) 03/26/1995   central upper back (CX35FU),left upper back (CX35FU)  . BCC (basal cell carcinoma) 12/30/1997   right cheek (exc. )  . BCC (basal cell carcinoma) 08/06/1999   upper right shin (CX35FU), upper right shin (CX35FU)  . BCC (basal cell carcinoma) 08/24/1998   left breast (CX35FU)  . BCC (basal cell carcinoma) 08/05/2001   right inner cheek (MOHS), Left upper back (CX35FU),right upper back (CX35FU),, right top hand (CX35FU)  . BCC (basal cell carcinoma) 08/02/2002   upper back(CX35FU)  . BCC (basal cell carcinoma) 09/12/2003   right mid back (CX35FU), right thigh (deep freeze+aldara) Left thigh outer (deep freeze +aldara)  . BCC (basal cell carcinoma) 06/03/2005   rigth scapula (CX35FU),   . BCC (basal cell carcinoma) 06/05/2006   right ant. lateral thigh (CX35FU)  . BCC (basal cell carcinoma) 08/08/2010   right forearm (CX35FU)  . BCC (basal cell carcinoma) 07/01/2011   right shin (MOHS)  . BCC (basal cell carcinoma) 12/14/2013   left upper thigh(CX35FU), left upper arm (CX35FU), mid right back (CX35FU), lower right back (CX35FU)  . BCC (basal cell carcinoma) 02/28/2016   left forearm  . HLD (hyperlipidemia)   . 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 SCC (squamous cell carcinoma) 09/12/2003   upper back (  CX35FU)  . SCC (squamous cell carcinoma) 06/03/2005   left v of neck (CX35FU), Left shin sup. (CX35FU)  . SCC (squamous cell carcinoma) 06/05/2006   mid chest (CX35FU)  . SCC (squamous cell carcinoma) 08/08/2010   left temple (CX35FU), Left upper arm (CX35FU), right forearm/wrist (CX35FU)  . SCC (squamous cell carcinoma) 02/06/2012   Left post neck-tx p bx  . SCC (squamous cell carcinoma) 11/09/2012   left hand-tx p bx-, left chest sup -tx p bx, left chest inf. (CX35FU)  . SCC (squamous cell carcinoma) 08/04/2014    left clavicle-tx p bx  . SCC (squamous cell carcinoma) 11/30/2014   lower right leg distal (CX35FU), lower right leg, prox. (CX35FU)  . SCC (squamous cell carcinoma) 05/17/2015   right jawline (CX35FU), Left sideburn (CX35FU)  . SCC (squamous cell carcinoma) 09/10/2017   left forearm-tx p bx, left temple -tx p bx    Past Surgical History:  Procedure Laterality Date  . COLONOSCOPY  01/01/2012   Procedure: COLONOSCOPY;  Surgeon: Rogene Houston, MD;  [facility];  Service: Endoscopy;  Laterality: N/A;  830  . INTRAMEDULLARY (IM) NAIL INTERTROCHANTERIC Left 04/26/2017   Procedure: INTRAMEDULLARY (IM) NAIL INTERTROCHANTRIC;  Surgeon: Leandrew Koyanagi, MD;  Location: [place];  Service: Orthopedics;  Laterality: Left;  . KNEE SURGERY    . ORIF TIBIA PLATEAU      Family History  Problem Relation Age of Onset  . CAD Mother   . Stroke Sister   . Stroke Brother     Social history:  reports that she has never smoked. She has never used smokeless tobacco. She reports current alcohol use. She reports that she does not use drugs.  Medications:  Prior to Admission medications   Medication Sig Start Date End Date Taking? Authorizing Provider  acetaminophen (TYLENOL) 500 MG tablet Take 500 mg by mouth every 6 (six) hours as needed for headache (pain).   Yes [provider]  ALPRAZolam Duanne Moron) 0.5 MG tablet Take 0.5 tablets (0.25 mg total) by mouth at bedtime as needed for anxiety or sleep. 04/29/17  Yes Patrecia Pour, MD  ALPRAZolam Duanne Moron) 0.5 MG tablet TAKE 1/2 TABLET BY MOUTH AT BEDTIME 03/07/18  Yes [provider]  aspirin (ASPIRIN 81) 81 MG chewable tablet Chew by mouth daily.   Yes [provider]  Multiple Vitamin (MULTIVITAMIN WITH MINERALS) TABS Take 1 tablet by mouth daily.   Yes [provider]  pravastatin (PRAVACHOL) 20 MG tablet Take 20 mg by mouth daily.   Yes [provider]  pregabalin (LYRICA) 100 MG capsule Take 100 mg by mouth 2  (two) times daily. 12/04/18  Yes [provider]      Allergies  Allergen Reactions  . Codeine Nausea And Vomiting    ROS:  Out of a complete 14 system review of symptoms, the patient complains only of the following symptoms, and all other reviewed systems are negative.  Left facial pain, eye tearing  Blood pressure 139/81, pulse 83, temperature 98 F (36.7 C), temperature source Temporal, height 5\' 6"  (1.676 m), weight 163 lb (73.9 kg).  Physical Exam  General: The patient is alert and cooperative at the time of the examination.  Eyes: Pupils are equal, round, and reactive to light. Discs are flat bilaterally.  Neck: The neck is supple, no carotid bruits are noted.  Respiratory: The respiratory examination is clear.  Cardiovascular: The cardiovascular examination reveals a regular rate and rhythm, no obvious murmurs or rubs  are noted.  Skin: Extremities are without significant edema.  Neurologic Exam  Mental status: The patient is alert and oriented x 3 at the time of the examination. The patient has apparent normal recent and remote memory, with an apparently normal attention span and concentration ability.  Cranial nerves: Facial symmetry is not present.  Slight ptosis of the left eye is seen.  There is good sensation of the face to pinprick and soft touch bilaterally. The strength of the facial muscles and the muscles to head turning and shoulder shrug are normal bilaterally. Speech is well enunciated, no aphasia or dysarthria is noted. Extraocular movements are full. Visual fields are full. The tongue is midline, and the patient has symmetric elevation of the soft palate. No obvious hearing deficits are noted.  Motor: The motor testing reveals 5 over 5 strength of all 4 extremities. Good symmetric motor tone is noted throughout.  Sensory: Sensory testing is intact to pinprick, soft touch, vibration sensation, and position sense on all 4 extremities. No evidence of  extinction is noted.  Coordination: Cerebellar testing reveals good finger-nose-finger and heel-to-shin bilaterally.  Gait and station: Gait is slightly wide-based, there is a leg length discrepancy. Tandem gait is normal. Romberg is negative. No drift is seen.  Reflexes: Deep tendon reflexes are symmetric and normal bilaterally. Toes are downgoing bilaterally.   Assessment/Plan:  1.  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  She has tearing of the left eye, for this reason a SUNCT type headache does need to be considered.  The patient will remain on Lyrica, we will add carbamazepine to the regimen, if this is not effective, we may add Lamictal later.  She will have MRI of the brain with sedation.  She will follow-up here in 3 months.  Jill Alexanders MD 12/09/2018 2:56 PM  [facility] [address] [place], [place] [address]  Phone [phone] Fax [phone]

## 2018-12-09 NOTE — Telephone Encounter (Signed)
[employer] medicare order sent to [facility]. No auth they will reach out to the patient to schedule.  

## 2018-12-09 NOTE — Patient Instructions (Signed)
We will start the carbamazepine for the neuralgia pain.  Tegretol (carbamazepine) may result in dizziness, gait instability, cognitive slowing, or drowsiness. Sometimes, and allergic rash may occur, or a photosensitive rash may occur. If any significant side effects are noted, please contact our office.

## 2018-12-10 ENCOUNTER — Telehealth: Payer: Self-pay

## 2018-12-10 LAB — COMPREHENSIVE METABOLIC PANEL
ALT: 10 IU/L (ref 0–32)
AST: 18 IU/L (ref 0–40)
Albumin/Globulin Ratio: 1.6 (ref 1.2–2.2)
Albumin: 4.4 g/dL (ref 3.7–4.7)
Alkaline Phosphatase: 71 IU/L (ref 39–117)
BUN/Creatinine Ratio: 28 (ref 12–28)
BUN: 18 mg/dL (ref 8–27)
Bilirubin Total: 0.6 mg/dL (ref 0.0–1.2)
CO2: 24 mmol/L (ref 20–29)
Calcium: 9.8 mg/dL (ref 8.7–10.3)
Chloride: 104 mmol/L (ref 96–106)
Creatinine, Ser: 0.65 mg/dL (ref 0.57–1.00)
GFR calc Af Amer: 97 mL/min/{1.73_m2} (ref >59)
GFR calc non Af Amer: 84 mL/min/{1.73_m2} (ref >59)
Globulin, Total: 2.8 g/dL (ref 1.5–4.5)
Glucose: 97 mg/dL (ref 65–99)
Potassium: 4 mmol/L (ref 3.5–5.2)
Sodium: 144 mmol/L (ref 134–144)
Total Protein: 7.2 g/dL (ref 6.0–8.5)

## 2018-12-10 LAB — CBC WITH DIFFERENTIAL/PLATELET
Basophils Absolute: 0 10*3/uL (ref 0.0–0.2)
Basos: 1 %
EOS (ABSOLUTE): 0.1 10*3/uL (ref 0.0–0.4)
Eos: 1 %
Hematocrit: 41.9 % (ref 34.0–46.6)
Hemoglobin: 13.9 g/dL (ref 11.1–15.9)
Immature Grans (Abs): 0 10*3/uL (ref 0.0–0.1)
Immature Granulocytes: 0 %
Lymphocytes Absolute: 1.4 10*3/uL (ref 0.7–3.1)
Lymphs: 23 %
MCH: 30.5 pg (ref 26.6–33.0)
MCHC: 33.2 g/dL (ref 31.5–35.7)
MCV: 92 fL (ref 79–97)
Monocytes Absolute: 0.5 10*3/uL (ref 0.1–0.9)
Monocytes: 7 %
Neutrophils Absolute: 4.3 10*3/uL (ref 1.4–7.0)
Neutrophils: 68 %
Platelets: 194 10*3/uL (ref 150–450)
RBC: 4.55 x10E6/uL (ref 3.77–5.28)
RDW: 12.6 % (ref 11.7–15.4)
WBC: 6.3 10*3/uL (ref 3.4–10.8)

## 2018-12-10 LAB — SEDIMENTATION RATE: Sed Rate: 10 mm/hr (ref 0–40)

## 2018-12-10 NOTE — Telephone Encounter (Signed)
Pt notified of lab results and verbalized understanding.

## 2018-12-10 NOTE — Telephone Encounter (Signed)
-----   Message from Kathrynn Ducking, MD sent at 12/10/2018  7:26 AM EDT -----  The blood work results are unremarkable. Please call the patient. ----- Message ----- From: Lavone Neri Lab Results In Sent: 12/10/2018   5:38 AM EDT To: Kathrynn Ducking, MD

## 2018-12-14 ENCOUNTER — Telehealth: Payer: Self-pay | Admitting: Neurology

## 2018-12-14 NOTE — Telephone Encounter (Signed)
Pt called me back, she reports since 12/09/18 the prednisone/tegretol has helped her facial pain greatly. Pt wanted to confirm her medication dosage for the tegretol. Pt was informed to continue taking the prednisone until completed and after a week of taking 0.5 tab bid increase to 1 tab bid. Pt was agreeable and states she is going to wait 1-2 weeks before scheduling MRI.

## 2018-12-14 NOTE — Telephone Encounter (Signed)
I reached out to the pt and attempted to discuss this appt. Pt stated she was five minutes from her home and would prefer I call back in 5 mins.

## 2018-12-14 NOTE — Telephone Encounter (Signed)
Pt called in and stated she will finish prednisone pack tomorrow , she states she was also told to take Tegretol twice a day until Wednesday , and Thursday 1 whole in morning and 1 whole at night. She stated on Friday afternoon her pain was completely gone. She hasnt had a episode sine Friday Morning . She states she is unsure what to do about Medication , she wants a call back for further information on what to do.

## 2018-12-14 NOTE — Telephone Encounter (Signed)
Called pt back and was connected with her vm. Left vm asking her to call me back.

## 2018-12-22 MED ORDER — CARBAMAZEPINE 200 MG PO TABS: ORAL_TABLET | ORAL | 3 refills | Status: DC

## 2018-12-22 NOTE — Addendum Note (Signed)
Addended by: Kathrynn Ducking on: 12/22/2018 06:10 PM   Modules accepted: Orders

## 2018-12-22 NOTE — Telephone Encounter (Signed)
I reached out to the pt. She reports when she increased to the 1 tab of tegretol bid she felt groggy and was not able to function at her full capacity. Pt would like to take 1/2 tab bid if MD is in agreement. Pt did express worry if she took the 1/2 tab if the likely hood of her facial pain returning would increase?

## 2018-12-22 NOTE — Telephone Encounter (Signed)
Pt called in and wants to know if she can come off the tegretol 1 tab po daily and go back to 0.5 tab po bid

## 2018-12-22 NOTE — Telephone Encounter (Signed)
I called the patient.  The patient had difficulty tolerating the 200 mg twice daily dosing of carbamazepine, but the medication has helped even at the 1/2 tablet twice daily dose to control pain.  She will stay at that dose, it may be that later on she can tolerate a higher dose as she gets acclimated to the medication.  She will use the lowest dose that controls her discomfort.  She will stay on the Lyrica for now.

## 2019-01-04 ENCOUNTER — Encounter (INDEPENDENT_AMBULATORY_CARE_PROVIDER_SITE_OTHER): Payer: Self-pay | Admitting: *Deleted

## 2019-01-11 ENCOUNTER — Telehealth: Payer: Self-pay

## 2019-01-11 NOTE — Telephone Encounter (Signed)
Patient called wanting to speak with Dr. [name] or his nurse about her MRI on Thursday. She just has a few questions. I advised her that Dr. [name] was out of the office this week but that his nurse would contact her back. Pt voiced understanding.

## 2019-01-11 NOTE — Telephone Encounter (Signed)
I reached out to the pt. She reports high anxiety due to her upcoming MRI appointment. Pt wanted to know why the MRI is being recommended. I advised pt Dr. [name] is recommending the MRI to further evaluate where the episodes of facial pain and headaches are coming from. Pt reports she is going to try her best and complete the MRI, she states the sedation MRI gives her high anxiety.  Pt was advised to call back as needed in regards to this.

## 2019-01-14 ENCOUNTER — Ambulatory Visit
Admission: RE | Admit: 2019-01-14 | Discharge: 2019-01-14 | Disposition: A | Discharge status: Home / Self Care | Payer: Medicare Other | Source: Ambulatory Visit | Attending: Neurology | Admitting: Neurology

## 2019-01-14 ENCOUNTER — Other Ambulatory Visit: Payer: Self-pay

## 2019-01-14 DIAGNOSIS — G4489 Other headache syndrome: Secondary | ICD-10-CM | POA: Diagnosis not present

## 2019-01-14 MED ORDER — GADOBENATE DIMEGLUMINE 529 MG/ML IV SOLN
15.0000 mL | Freq: Once | INTRAVENOUS | Status: AC | PRN
  Administered 2019-01-14: 15 mL via INTRAVENOUS

## 2019-01-17 ENCOUNTER — Telehealth: Payer: Self-pay | Admitting: Neurology

## 2019-01-17 DIAGNOSIS — I679 Cerebrovascular disease, unspecified: Secondary | ICD-10-CM

## 2019-01-17 NOTE — Telephone Encounter (Signed)
I called the patient.  MRI of the brain shows what appears to be typical changes of longstanding hypertension but the patient does not have a history of high blood pressure.  She is on low-dose aspirin, she will continue this.  The low-dose carbamazepine taking 100 mg twice daily has been very beneficial with controlling her facial pain.  She will continue this medication.  I will order blood work looking for vasculitis and for hypercoagulable state.  She will continue the low-dose aspirin.    MRI brain 01/15/19:  IMPRESSION: This MRI of the brain with and without contrast shows the following: 1.    There are T2/flair hyperintense foci in the hemispheres with confluencies in the periatrial white matter consistent with moderate chronic microvascular ischemic change.  None of the foci appear to be acute. 2.    There are at least a dozen chronic microhemorrhages in the subcortical white matter.    No microhemorrhages are noted in the deep gray matter or posterior fossa.The distribution could be consistent with an incidental finding, mild cerebral amyloid angiopathy or prior trauma. 3.    The trigeminal nerves appear normal. 4.    There is a normal enhancement pattern and no acute findings.

## 2019-01-21 ENCOUNTER — Other Ambulatory Visit: Payer: Self-pay

## 2019-01-21 ENCOUNTER — Other Ambulatory Visit (INDEPENDENT_AMBULATORY_CARE_PROVIDER_SITE_OTHER): Payer: Self-pay

## 2019-01-21 DIAGNOSIS — I679 Cerebrovascular disease, unspecified: Secondary | ICD-10-CM

## 2019-01-21 DIAGNOSIS — Z0289 Encounter for other administrative examinations: Secondary | ICD-10-CM

## 2019-01-28 LAB — PAN-ANCA
ANCA Proteinase 3: <3.5 U/mL (ref 0.0–3.5)
Atypical pANCA: <1:20 {titer}
C-ANCA: <1:20 {titer}
Myeloperoxidase Ab: <9 U/mL (ref 0.0–9.0)
P-ANCA: <1:20 {titer}

## 2019-01-28 LAB — LUPUS ANTICOAGULANT
Dilute Viper Venom Time: 32.1 s (ref 0.0–47.0)
PTT Lupus Anticoagulant: 28.1 s (ref 0.0–51.9)
Thrombin Time: 20.2 s (ref 0.0–23.0)
dPT Confirm Ratio: 0.9 Ratio (ref 0.00–1.40)
dPT: 37.8 s (ref 0.0–55.0)

## 2019-01-28 LAB — ANA W/REFLEX: Anti Nuclear Antibody (ANA): NEGATIVE

## 2019-01-28 LAB — METHYLMALONIC ACID, SERUM: Methylmalonic Acid: 234 nmol/L (ref 0–378)

## 2019-01-28 LAB — FACTOR 5 LEIDEN

## 2019-01-28 LAB — MTHFR DNA ANALYSIS

## 2019-02-03 ENCOUNTER — Telehealth: Payer: Self-pay

## 2019-02-03 NOTE — Telephone Encounter (Signed)
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  No concerns.  Please call the patient. ----- Message ----- From: Lavone Neri Lab Results In Sent: 01/22/2019   1:36 PM EDT To: Kathrynn Ducking, MD

## 2019-02-03 NOTE — Telephone Encounter (Signed)
Pt notified of results and verbalized understanding. Pt wanted MD to be advised 2 of her siblings have passed away of a stroke and she. This is just a FYI.

## 2019-02-03 NOTE — Telephone Encounter (Signed)
Pt returned call and I was able to review this message again. Pt was was agreeable/verbalized understanding.

## 2019-02-04 ENCOUNTER — Other Ambulatory Visit: Payer: Self-pay

## 2019-02-04 MED ORDER — CARBAMAZEPINE 200 MG PO TABS: ORAL_TABLET | ORAL | 3 refills | Status: DC

## 2019-02-08 ENCOUNTER — Other Ambulatory Visit: Payer: Self-pay | Admitting: Dermatology

## 2019-02-17 ENCOUNTER — Telehealth: Payer: Self-pay

## 2019-02-17 NOTE — Telephone Encounter (Signed)
Patient called and requested to speak with the nurse, no further information was provided when asked. Please call and advise.

## 2019-02-17 NOTE — Telephone Encounter (Signed)
I reached out to the pt. She reports sudden onset of left eye pain that does not linger over the past few days. Patient states this pain will "hit" last for a few seconds and then subside just as quickly. Pt is concerned this may be the initial stage of the severe pain returning.  She wanted to know if her working out for an hour and fifteen mins each day could be bringing the mild pain on and if it would be beneficial to reduce the frequency?   Pt reports she is still stating Carbamazepine 200 mg 1/2 tab bid per day.

## 2019-02-17 NOTE — Telephone Encounter (Signed)
I called the patient.  Of the last several days she has had twinges of pain around the left eye.  This could herald the onset of more severe pain, I will go up on the carbamazepine taking a half tab in the morning and a full tab in the evening, if she does well for couple weeks she can cut back to what she was taking, 1/2 tablet twice daily.  No need to stop physical exercise.  She claims that when blowing or yawning or sneezing may bring on the jabs of pain.

## 2019-03-01 MED ORDER — CARBAMAZEPINE ER 200 MG PO CP12: 200.0000 mg | ORAL_CAPSULE | Freq: Two times a day (BID) | ORAL | 2 refills | Status: DC

## 2019-03-01 NOTE — Telephone Encounter (Signed)
I returned the pt's call. She reports; the twinges of pain around the left eye are still present. Pt confirmed she is taking 1/2 tab of carbamazepine 200 mg in the am, at lunch and pm. Pt reports since starting this med she feels like her memory has been affected and the more medication she takes the worse her memory is.  Pt would like for MD to review message and further advise.

## 2019-03-01 NOTE — Telephone Encounter (Signed)
Pt is calling re: the hitting paint over her left eye.  Pt is asking for a call from [place] to discuss

## 2019-03-01 NOTE — Addendum Note (Signed)
Addended by: Kathrynn Ducking on: 03/01/2019 05:17 PM   Modules accepted: Orders

## 2019-03-01 NOTE — Telephone Encounter (Signed)
I called the patient.  She is having increased frequency of twinges of pain even with the carbamazepine dose increase.  We will convert to Carbatrol taking 200 mg twice daily as she is having cognitive side effects on the short acting carbamazepine, Carbatrol may have fewer cognitive side effects.

## 2019-03-11 ENCOUNTER — Other Ambulatory Visit: Payer: Self-pay | Admitting: Dermatology

## 2019-03-15 ENCOUNTER — Telehealth: Payer: Self-pay

## 2019-03-15 NOTE — Telephone Encounter (Signed)
Patient called and stated that she has an appt with another doctor at 1:30 pm. She would like to know is there a way she can see Dr. [name] on sooner on the same day. Please advise.

## 2019-03-15 NOTE — Telephone Encounter (Signed)
I reached out to the Shelby Hill and I advised we could see her tomorrow at 8 am check in of 730 put Shelby Hill reports this would be too early.  I advised other than this appt, Dr. [name] did not have any earlier appts. Shelby Hill was agreeable to keeping 03/18/2019 at 2 pm and will check in 15-30 min prior to appt.

## 2019-03-15 NOTE — Telephone Encounter (Signed)
I called the patient.  She has a conflict with her appointment here at 2:00 on 22 October.  She wants to reschedule appointment, we will try to get her worked in elsewhere.

## 2019-03-16 ENCOUNTER — Other Ambulatory Visit: Payer: Self-pay | Admitting: Internal Medicine

## 2019-03-16 DIAGNOSIS — Z1231 Encounter for screening mammogram for malignant neoplasm of breast: Secondary | ICD-10-CM

## 2019-03-18 ENCOUNTER — Ambulatory Visit: Payer: Medicare Other | Admitting: Neurology

## 2019-03-18 ENCOUNTER — Other Ambulatory Visit: Payer: Self-pay

## 2019-03-18 ENCOUNTER — Encounter: Payer: Self-pay | Admitting: Neurology

## 2019-03-18 DIAGNOSIS — G500 Trigeminal neuralgia: Secondary | ICD-10-CM | POA: Diagnosis not present

## 2019-03-18 MED ORDER — LAMOTRIGINE 25 MG PO TABS: ORAL_TABLET | ORAL | 1 refills | Status: DC

## 2019-03-18 NOTE — Progress Notes (Signed)
Reason for visit: Atypical facial pain  Shelby Hill is an 83 y.o. female  History of present illness:  Shelby Hill is an 83 year old right-handed white female with a history of sharp jabbing pains in the left V1 distribution.  The patient has been on Lyrica and now is on carbamazepine taking 200 mg twice daily without full control of her pain.  She has developed cognitive side effects on these medications, she has difficulty with word finding and short-term memory at this point.  She denies any gait instability.  She occasionally may have what sounds like brief myoclonic jerks of the arms on the Lyrica.  The patient is still not fully controlled with her discomfort, she comes into the office today for an evaluation.  Past Medical History:  Diagnosis Date  . BCC (basal cell carcinoma) 02/09/1988   left upper thigh,left lower thigh  . BCC (basal cell carcinoma) 10/04/1962   left clavicle,left breast, Back  . BCC (basal cell carcinoma) 03/06/1989   mid back  . BCC (basal cell carcinoma) 04/02/1990   left upper shoulder (CX35FU),left upper breast(CX35FU),above left clavicle, right outer back,  . BCC (basal cell carcinoma) 04/01/1991   right cetner outer brown (CX3+exc. ), upper left chest,upper left chest medial, right back  . BCC (basal cell carcinoma) 02/01/1994   right back  . BCC (basal cell carcinoma) 03/26/1995   central upper back (CX35FU),left upper back (CX35FU)  . BCC (basal cell carcinoma) 12/30/1997   right cheek (exc. )  . BCC (basal cell carcinoma) 08/06/1999   upper right shin (CX35FU), upper right shin (CX35FU)  . BCC (basal cell carcinoma) 08/24/1998   left breast (CX35FU)  . BCC (basal cell carcinoma) 08/05/2001   right inner cheek (MOHS), Left upper back (CX35FU),right upper back (CX35FU),, right top hand (CX35FU)  . BCC (basal cell carcinoma) 08/02/2002   upper back(CX35FU)  . BCC (basal cell carcinoma) 09/12/2003   right mid back (CX35FU), right thigh (deep  freeze+aldara) Left thigh outer (deep freeze +aldara)  . BCC (basal cell carcinoma) 06/03/2005   rigth scapula (CX35FU),   . BCC (basal cell carcinoma) 06/05/2006   right ant. lateral thigh (CX35FU)  . BCC (basal cell carcinoma) 08/08/2010   right forearm (CX35FU)  . BCC (basal cell carcinoma) 07/01/2011   right shin (MOHS)  . BCC (basal cell carcinoma) 12/14/2013   left upper thigh(CX35FU), left upper arm (CX35FU), mid right back (CX35FU), lower right back (CX35FU)  . BCC (basal cell carcinoma) 02/28/2016   left forearm  . HLD (hyperlipidemia)   . 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 SCC (squamous cell carcinoma) 09/12/2003   upper back (CX35FU)  . SCC (squamous cell carcinoma) 06/03/2005   left v of neck (CX35FU), Left shin sup. (CX35FU)  . SCC (squamous cell carcinoma) 06/05/2006   mid chest (CX35FU)  . SCC (squamous cell carcinoma) 08/08/2010   left temple (CX35FU), Left upper arm (CX35FU), right forearm/wrist (CX35FU)  . SCC (squamous cell carcinoma) 02/06/2012   Left post neck-tx p bx  . SCC (squamous cell carcinoma) 11/09/2012   left hand-tx p bx-, left chest sup -tx p bx, left chest inf. (CX35FU)  . SCC (squamous cell carcinoma) 08/04/2014   left clavicle-tx p bx  . SCC (squamous cell carcinoma) 11/30/2014   lower right leg distal (CX35FU), lower right leg, prox. (CX35FU)  . SCC (squamous cell carcinoma) 05/17/2015   right jawline (CX35FU), Left sideburn (CX35FU)  . SCC (squamous cell  carcinoma) 09/10/2017   left forearm-tx p bx, left temple -tx p bx    Past Surgical History:  Procedure Laterality Date  . COLONOSCOPY  01/01/2012   Procedure: COLONOSCOPY;  Surgeon: Rogene Houston, MD;  [facility];  Service: Endoscopy;  Laterality: N/A;  830  . INTRAMEDULLARY (IM) NAIL INTERTROCHANTERIC Left 04/26/2017   Procedure: INTRAMEDULLARY (IM) NAIL INTERTROCHANTRIC;  Surgeon: Leandrew Koyanagi, MD;  Location: [place];  Service: Orthopedics;   Laterality: Left;  . KNEE SURGERY    . ORIF TIBIA PLATEAU      Family History  Problem Relation Age of Onset  . CAD Mother   . Stroke Sister   . Stroke Brother     Social history:  reports that she has never smoked. She has never used smokeless tobacco. She reports current alcohol use. She reports that she does not use drugs.    Allergies  Allergen Reactions  . Codeine Nausea And Vomiting    Medications:  Prior to Admission medications   Medication Sig Start Date End Date Taking? Authorizing Provider  acetaminophen (TYLENOL) 500 MG tablet Take 500 mg by mouth every 6 (six) hours as needed for headache (pain).   Yes [provider]  ALPRAZolam Duanne Moron) 0.5 MG tablet Take 0.5 tablets (0.25 mg total) by mouth at bedtime as needed for anxiety or sleep. 04/29/17  Yes Patrecia Pour, MD  aspirin (ASPIRIN 81) 81 MG chewable tablet Chew by mouth daily.   Yes [provider]  calcitonin, salmon, (MIACALCIN/FORTICAL) 200 UNIT/ACT nasal spray USE ONE SPRAY IN ONE NOSTRIL ONCE DAILY, ALTERNATING NOSTRILS 01/20/19  Yes [provider]  carbamazepine (CARBATROL) 200 MG 12 hr capsule Take 1 capsule (200 mg total) by mouth 2 (two) times daily. 03/01/19  Yes Kathrynn Ducking, MD  Multiple Vitamin (MULTIVITAMIN WITH MINERALS) TABS Take 1 tablet by mouth daily.   Yes [provider]  pregabalin (LYRICA) 100 MG capsule Take 100 mg by mouth 2 (two) times daily. 12/04/18  Yes [provider]  rosuvastatin (CRESTOR) 5 MG tablet Take 5 mg by mouth at bedtime as needed. 02/25/19  Yes [provider]    ROS:  Out of a complete 14 system review of symptoms, the patient complains only of the following symptoms, and all other reviewed systems are negative.  Facial pain Concentration difficulties  Blood pressure 136/78, pulse 81, temperature 97.9 F (36.6 C), temperature source Temporal, height 5\' 6"  (1.676 m), weight 169 lb (76.7 kg).  Physical Exam   General: The patient is alert and cooperative at the time of the examination.  Skin: No significant peripheral edema is noted.   Neurologic Exam  Mental status: The patient is alert and oriented x 3 at the time of the examination. The patient has apparent normal recent and remote memory, with an apparently normal attention span and concentration ability.   Cranial nerves: Facial symmetry is present. Speech is normal, no aphasia or dysarthria is noted. Extraocular movements are full. Visual fields are full.  Motor: The patient has good strength in all 4 extremities.  Sensory examination: Soft touch sensation is symmetric on the face, arms, and legs.  Coordination: The patient has good finger-nose-finger and heel-to-shin bilaterally.  Gait and station: The patient has a normal gait. Tandem gait is slightly unsteady. Romberg is negative. No drift is seen.  Reflexes: Deep tendon reflexes are symmetric.   MRI brain 01/15/19:  IMPRESSION: This MRI of the brain with and without contrast shows  the following: 1. There are T2/flair hyperintense foci in the hemispheres with confluencies in the periatrial white matter consistent with moderate chronic microvascular ischemic change. None of the foci appear to be acute. 2. There are at least a dozen chronic microhemorrhages in the subcortical white matter. No microhemorrhages are noted in the deep gray matter or posterior fossa.The distribution could be consistent with an incidental finding, mild cerebral amyloid angiopathy or prior trauma. 3. The trigeminal nerves appear normal. 4. There is a normal enhancement pattern and no acute findings.  * MRI scan images were reviewed online. I agree with the written report.    Assessment/Plan:  1.  Left V1 facial pain  The distribution of the pain could be consistent with a SUNCT headache syndrome, the patient reports that some days she may have over 20 jabs of pain in a day.  The  patient is not fully responsive to the carbamazepine and to the Lyrica, she seemed to respond to carbamazepine initially.  The patient will be tapered off of the carbamazepine going to 1 capsule daily for 2 weeks and then stop, we will add lamotrigine working up to 75 mg twice daily, when she gets this dose she will call me and I will send in the prescription for the 100 mg tablets.  The patient will follow-up here in about 4 months.  Jill Alexanders MD 03/18/2019 2:08 PM  [facility] [address] [place], [place] [address]  Phone [phone] Fax [phone]

## 2019-03-18 NOTE — Patient Instructions (Signed)
We will start lamictal for the headache pain. When you get to 3 tablets twice a day for 2 weeks, call for a new prescription.  After 2 weeks on the lamictal, drop the carbamazepine dose to 200 mg a day for 2 weeks, then stop.

## 2019-04-30 ENCOUNTER — Telehealth: Payer: Self-pay | Admitting: Neurology

## 2019-04-30 NOTE — Telephone Encounter (Signed)
Pt called in and stated she has completed her dosing of carbamazepine (CARBATROL) 200 MG 12 hr capsule she was advised to call back in once completed .

## 2019-05-03 MED ORDER — LAMOTRIGINE 100 MG PO TABS: 100.0000 mg | ORAL_TABLET | Freq: Every day | ORAL | 3 refills | Status: DC

## 2019-05-03 NOTE — Telephone Encounter (Signed)
Left second vm asking for a call back from the pt to further discuss.

## 2019-05-03 NOTE — Telephone Encounter (Signed)
Pt returning call please call back °

## 2019-05-03 NOTE — Telephone Encounter (Signed)
Pharmacist from [facility] called wanting to confirm if the Lamictal rx should be 100 mg daily or Bid. I advised the rx should be bid. He verbalized understanding and sates he will discus meds with pt.

## 2019-05-03 NOTE — Telephone Encounter (Signed)
I contacted the pt and left a vm and requested a call back.

## 2019-05-03 NOTE — Telephone Encounter (Signed)
I will send in a prescription for the Lamictal 100 mg twice daily, it appears that this has been helpful in preventing breakthrough pain.

## 2019-05-03 NOTE — Addendum Note (Signed)
Addended by: Kathrynn Ducking on: 05/03/2019 04:06 PM   Modules accepted: Orders

## 2019-05-03 NOTE — Telephone Encounter (Addendum)
I reached out to the pt. She repots she has titrated up on the Lamictal she is taking Lamictal 25 mg 3 tab bid. She reports she has successfully tapered off the Carbamazepine she stopped on Friday.. Pt requests a refill for the Lamictal and if provider had any other recommendations for her? She did not report any break through facial pain events.

## 2019-05-03 NOTE — Addendum Note (Signed)
Addended by: Verlin Grills T on: 05/03/2019 04:00 PM   Modules accepted: Orders

## 2019-05-07 ENCOUNTER — Ambulatory Visit: Payer: Medicare Other

## 2019-06-22 ENCOUNTER — Telehealth: Payer: Self-pay

## 2019-06-22 NOTE — Telephone Encounter (Signed)
Patient called stating she is taking a couple of medications and has developed some trembling and is unsure if it is due to a medication she believes the cause could be between the lamoTRIgine (LAMICTAL) 100 MG tablet or pregabalin (LYRICA) 100 MG capsule but is unsure.   Please follow up

## 2019-06-22 NOTE — Telephone Encounter (Signed)
I called pt about having trembling in both hands. Pt states the left hand has intermittent trembling. She stated it has been when she feels stress related. When she does ADL or any activity it tends to tremor.Her right hand tremors less than left hand. Pt stated its has been occurring for three weeks and thought it would go away. She wants to know if  lyrica and lamictal are causing it. Pt has been on lyrica since 11/2018 and lamictal since 02/2019.  Stated message will be sent to work in MD. Pt verbalized understanding.

## 2019-06-22 NOTE — Telephone Encounter (Signed)
I called pt about Dr. Leta Baptist recommendations. I stated he would like to monitor for now. Its not likely medication related. He wanted Dr. [name] address more in detail when he returns. I advise pt to continue medication and she verbalized understanding.

## 2019-06-22 NOTE — Telephone Encounter (Signed)
Penumalli, Earlean Polka, MD  You 4 hours ago (12:30 PM)   Monitor for now; not likely med related. will ask dr [name] to address when he returns. -VRP   Message text

## 2019-06-23 MED ORDER — LAMOTRIGINE 100 MG PO TABS: 100.0000 mg | ORAL_TABLET | Freq: Two times a day (BID) | ORAL | 3 refills | Status: DC

## 2019-06-23 NOTE — Telephone Encounter (Signed)
I called the patient.  The patient is on Lamictal taking 100 mg twice daily.  Tremor is listed as a potential side effect of Lamictal, not Lyrica.  We will try to reduce the Lamictal dose to 50 mg in the morning and 100 mg in the evening to see if this improves the tremor, if the tremor is still there, go to 50 mg twice daily.

## 2019-07-15 ENCOUNTER — Telehealth (INDEPENDENT_AMBULATORY_CARE_PROVIDER_SITE_OTHER): Payer: Medicare PPO | Admitting: Neurology

## 2019-07-15 ENCOUNTER — Telehealth: Payer: Self-pay

## 2019-07-15 ENCOUNTER — Encounter: Payer: Self-pay | Admitting: Neurology

## 2019-07-15 DIAGNOSIS — G500 Trigeminal neuralgia: Secondary | ICD-10-CM | POA: Diagnosis not present

## 2019-07-15 MED ORDER — PREGABALIN 50 MG PO CAPS: ORAL_CAPSULE | ORAL | 3 refills | Status: DC

## 2019-07-15 MED ORDER — LAMOTRIGINE 100 MG PO TABS: ORAL_TABLET | ORAL | 3 refills | Status: DC

## 2019-07-15 NOTE — Progress Notes (Signed)
     Virtual Visit via Video Note  I connected with [name] on 07/15/19 at  3:00 PM EST by a video enabled telemedicine application and verified that I am speaking with the correct person using two identifiers.  Location: Patient: The patient is at daughter's house. Provider: The physician is at home.   I discussed the limitations of evaluation and management by telemedicine and the availability of in person appointments. The patient expressed understanding and agreed to proceed.  History of Present Illness: Shelby Hill is an 84 year old right-handed white female with a history of left V1 distribution sharp pains.  The patient did not respond fully to a combination of Lyrica and carbamazepine.  She was felt possibly to have a SUNCT headache, and was subsequently placed on lamotrigine.  The patient had a dramatic response to this medication, she has had total elimination of her head pain.  She has developed a slight tremor on the Lamictal, the medication was reduced to 50 mg in the morning and 100 mg in the evening.  The tremor has improved.  The patient does have some slight instability with walking on the Lyrica.  She has not had any falls.  Overall, she is pleased with how she is doing.   Observations/Objective: The clinical evaluation reveals the patient is alert and cooperative.  Speech is augmented, not aphasic or dysarthric.  Facial symmetry is present.  She has full extraocular movements.  She is able to protrude the tongue in the midline with good lateral movement of tongue.  She has good finger-nose-finger and heel shin.  Gait is normal.  Tandem gait is normal.  Romberg is negative.  No drift is seen.  Assessment and Plan: 1.  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  We will try to taper the pregabalin slightly, she will go to 50 mg in the morning and 100 mg in the evening for 2 weeks and then go to 50 mg twice daily.  The patient will be  continued on the current dose of the Lamictal at 50 mg in the morning and 100 mg in the evening.  The patient will follow up in 4 months.  If she continues to have no pain, we will try to taper off of the Lyrica and eventually the Lamictal as well.  Follow Up Instructions:    I discussed the assessment and treatment plan with the patient. The patient was provided an opportunity to ask questions and all were answered. The patient agreed with the plan and demonstrated an understanding of the instructions.   The patient was advised to call back or seek an in-person evaluation if the symptoms worsen or if the condition fails to improve as anticipated.  I provided 20 minutes of non-face-to-face time during this encounter.   Kathrynn Ducking, MD

## 2019-07-15 NOTE — Telephone Encounter (Signed)
If patient calls back please schedule her with Dr. [name] in 4 month follow up per the last video note.  Vm was left for patient stating this.

## 2019-07-19 ENCOUNTER — Telehealth: Payer: Self-pay | Admitting: Neurology

## 2019-07-19 NOTE — Telephone Encounter (Signed)
-----   Message from Kathrynn Ducking, MD sent at 07/15/2019  3:18 PM EST ----- Follow-up in 4 months, may see nurse practitioner

## 2019-07-19 NOTE — Telephone Encounter (Signed)
Called patient and LVM to schedule 4 month follow-up.

## 2019-08-18 ENCOUNTER — Other Ambulatory Visit: Payer: Self-pay | Admitting: Neurology

## 2019-08-19 ENCOUNTER — Telehealth: Payer: Self-pay | Admitting: Neurology

## 2019-08-19 NOTE — Telephone Encounter (Signed)
For now, for as needed use, she can take a second Lyrica 50 mg in the evening for the next few days, essentially 1 pill in AM and 2 in PM. Please call patient back with recommendation.

## 2019-08-19 NOTE — Telephone Encounter (Signed)
Pt called stating she is having light issues and would like to know what she should do if her issues worsened over the weekend in terms of her medication

## 2019-08-19 NOTE — Telephone Encounter (Signed)
Pt called wanting to speak to the RN or Provider about instructions on the way she is to take her lamoTRIgine (LAMICTAL) 100 MG tablet and her pregabalin (LYRICA) 50 MG capsule Pt states she is needing to do this before the weekend comes. Please advise.

## 2019-08-19 NOTE — Telephone Encounter (Signed)
Duplicate note. See note below.

## 2019-08-19 NOTE — Telephone Encounter (Signed)
Shelby Hill, Chabely   CG  08/19/19 9:49 AM Note   Pt called stating she is having light issues and would like to know what she should do if her issues worsened over the weekend in terms of her medication

## 2019-08-19 NOTE — Telephone Encounter (Signed)
I called Shelby Hill that Dr.Athar recommend she take lyrica one pill in the am and 2 in the pm until next week to see if that helps her. I stated Dr Rexene Alberts did not want to increase her lamictal a this time. Shelby Hill verbalized understanding.

## 2019-08-19 NOTE — Telephone Encounter (Signed)
I called pt about her concerns of having a trigeminal neuralgia flare up over the weekend. Pt stated over the last 3 to 4 weeks she has a sensation in her facial head area.IT starts on her right side of facial head area and radiates to the left side of her head. It happens 1 to 2 times a day and is very quick like trigeminal neuralgia sensation. She is worry if will happen over the weekend and will not be able to see a MD for treatment. She wanted to know if this happens over the weekend can she increase her Lamictal to a whole pill in the am and whole pill in the pm.RIght now she is doing half in am and whole at night. The lyrica dosage she is doing twice a day.During the call there was a lot of connection issues with noise in the background. I stated message will be sent to Dr. Rexene Alberts the work in md, pt verbalized understanding. I advise pt there is a work in MD for after hours.

## 2019-08-25 ENCOUNTER — Other Ambulatory Visit: Payer: Self-pay | Admitting: Neurology

## 2019-09-03 NOTE — Telephone Encounter (Signed)
Pt called again and is wanting to speak with the provider about her concerns that she is having with the increase of her pregabalin (LYRICA) 50 MG capsule. Please advise.

## 2019-09-06 MED ORDER — PREGABALIN 50 MG PO CAPS: ORAL_CAPSULE | ORAL | 3 refills | Status: DC

## 2019-09-06 MED ORDER — LAMOTRIGINE 100 MG PO TABS: ORAL_TABLET | ORAL | 3 refills | Status: DC

## 2019-09-06 NOTE — Addendum Note (Signed)
Addended by: Kathrynn Ducking on: 09/06/2019 12:36 PM   Modules accepted: Orders

## 2019-09-06 NOTE — Telephone Encounter (Signed)
I called patient.  Patient remains on lamotrigine 1/2 tablet in the morning and 1 tablet in the evening of the 100 mg tablets.  She has gone up on the Lyrica taking 100 mg twice daily currently, she will eventually taper off of this to the 50 mg twice daily, she is no longer have any twinges of pain.

## 2019-09-22 ENCOUNTER — Telehealth: Payer: Self-pay | Admitting: Neurology

## 2019-09-22 NOTE — Telephone Encounter (Addendum)
Pt is asking for a call to discuss how exactly she is supposed to take her   lamoTRIgine (LAMICTAL) 100 MG tablet   Please call.  Towards the end of the call pt mentioned that according to how she understands she is to take this medication she is need of a refill now, please send to [facility]

## 2019-09-22 NOTE — Telephone Encounter (Signed)
LEft vm for patient to call back.

## 2019-09-22 NOTE — Telephone Encounter (Signed)
Left second vm for patient to call back

## 2019-09-23 ENCOUNTER — Other Ambulatory Visit: Payer: Self-pay

## 2019-09-23 MED ORDER — LAMOTRIGINE 25 MG PO TABS: 25.0000 mg | ORAL_TABLET | Freq: Two times a day (BID) | ORAL | 3 refills | Status: DC

## 2019-09-23 NOTE — Telephone Encounter (Signed)
Made in error

## 2019-09-23 NOTE — Addendum Note (Signed)
Addended by: Kathrynn Ducking on: 09/23/2019 05:24 PM   Modules accepted: Orders

## 2019-09-23 NOTE — Telephone Encounter (Signed)
Pt returned call. Please call back when available. 

## 2019-09-23 NOTE — Telephone Encounter (Signed)
I called the patient.  She is on 50 mg of Lamictal twice daily, she is not having any pain, will cut back to 25 mg twice daily.

## 2019-09-23 NOTE — Telephone Encounter (Signed)
I called pt about her lamitcal dosage. Pt stated she only has about 6 pills left. I stated per his last phone note she is suppose to taper off until she has the pain has gone. I stated message will be sent to Dr. [name] if he wants to send new rx or taper off the 6 pills she has left.Pt verbalized understanding.

## 2019-09-27 ENCOUNTER — Telehealth: Payer: Self-pay | Admitting: Neurology

## 2019-09-27 NOTE — Telephone Encounter (Signed)
I called  Shelby Hill and stated the lamictal is 25mg  twice a day. He verbalized understanding.

## 2019-09-27 NOTE — Telephone Encounter (Signed)
Mali from [facility] needs clarification of the instructions for the pt's lamoTRIgine (LAMICTAL) 25 MG tablet Please advise.

## 2019-10-03 ENCOUNTER — Other Ambulatory Visit: Payer: Self-pay | Admitting: Neurology

## 2019-11-11 ENCOUNTER — Ambulatory Visit: Payer: Self-pay | Admitting: Neurology

## 2019-11-18 ENCOUNTER — Ambulatory Visit
Admission: RE | Admit: 2019-11-18 | Discharge: 2019-11-18 | Disposition: A | Discharge status: Home / Self Care | Payer: Medicare Other | Source: Ambulatory Visit | Attending: Internal Medicine | Admitting: Internal Medicine

## 2019-11-18 ENCOUNTER — Other Ambulatory Visit: Payer: Self-pay

## 2019-11-18 DIAGNOSIS — Z1231 Encounter for screening mammogram for malignant neoplasm of breast: Secondary | ICD-10-CM

## 2019-11-18 IMAGING — MG DIGITAL SCREENING BILAT W/ TOMO W/ CAD
6 of 10 series · 6 of 30 positions shown · non-contrast
Comparison: Previous exam(s).

CLINICAL DATA: Screening.

EXAM:
DIGITAL SCREENING BILATERAL MAMMOGRAM WITH TOMO AND CAD

[L CC synth-2D]
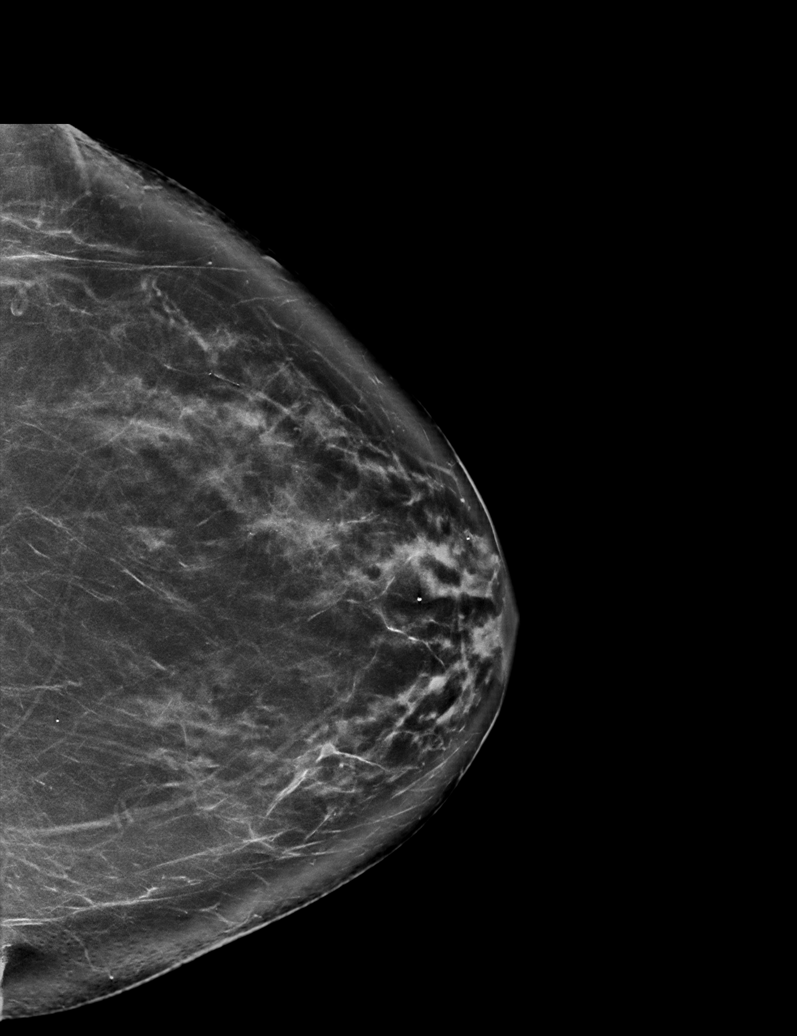

[R CC synth-2D]
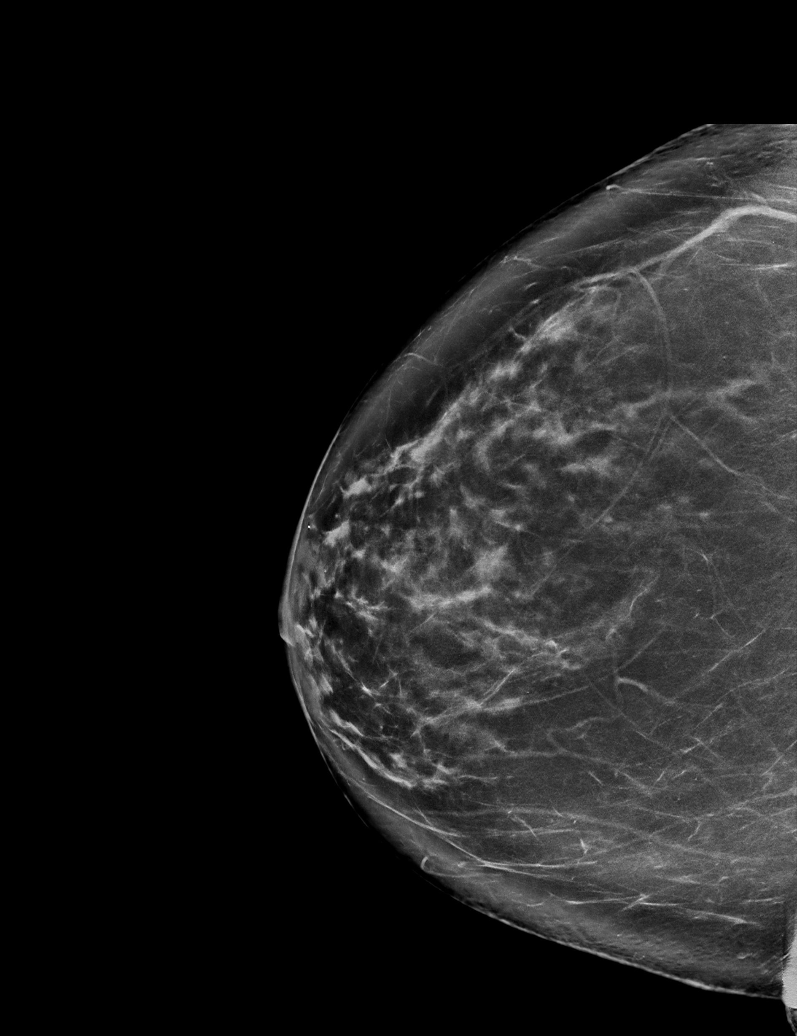

[R MLO synth-2D (1 of 2)]
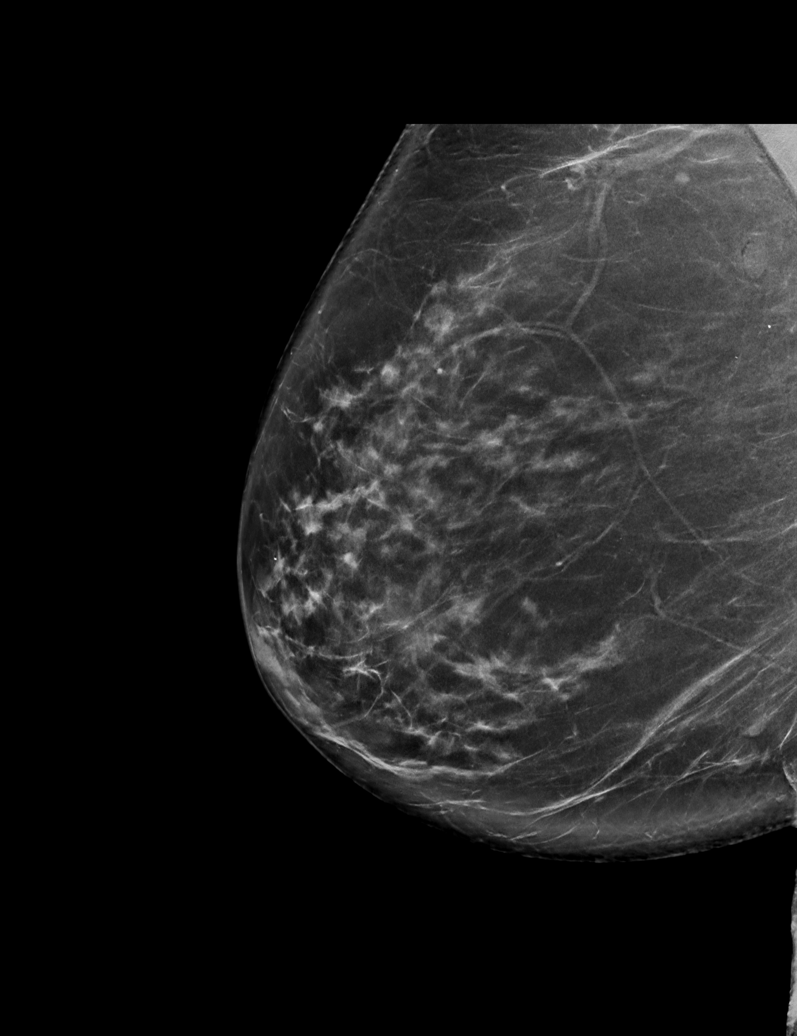

[L MLO synth-2D]
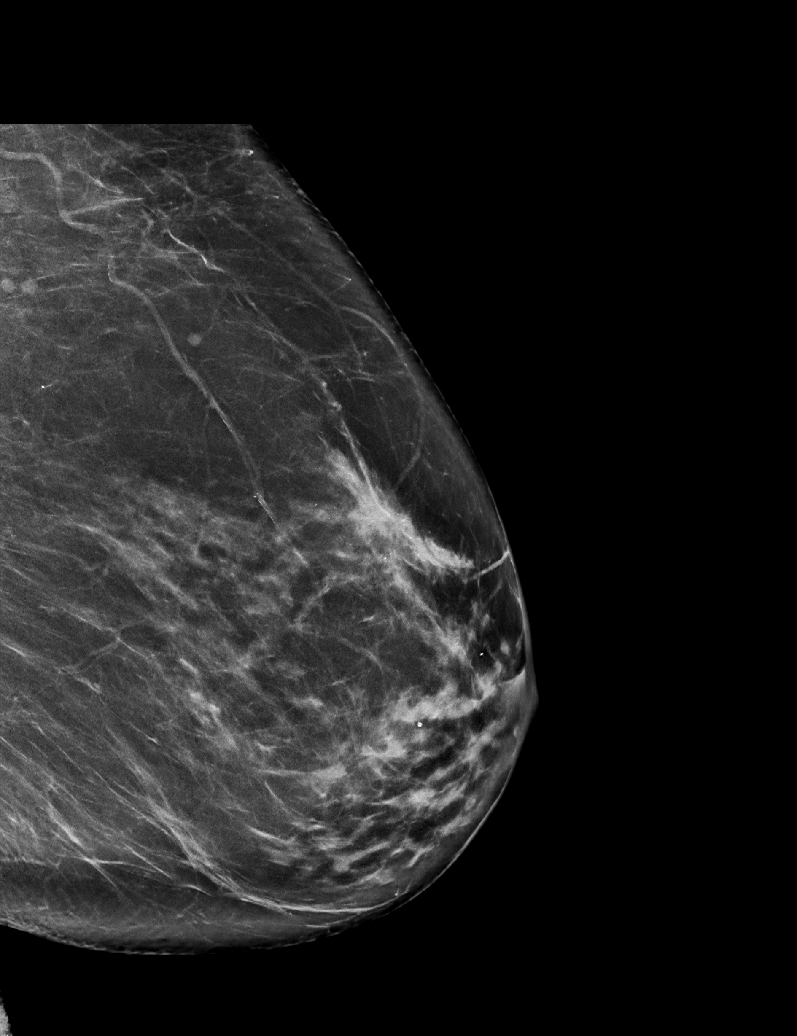

[R MLO synth-2D (2 of 2)]
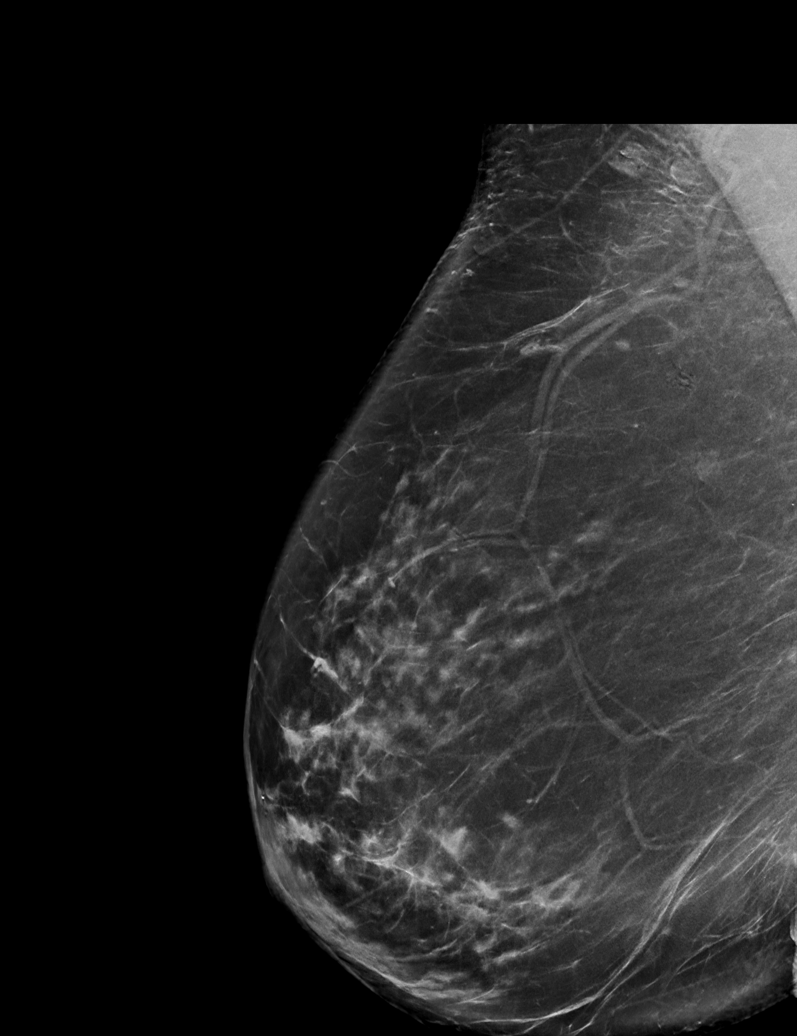

[L MLO tomo · tomo slice 39/77.0]
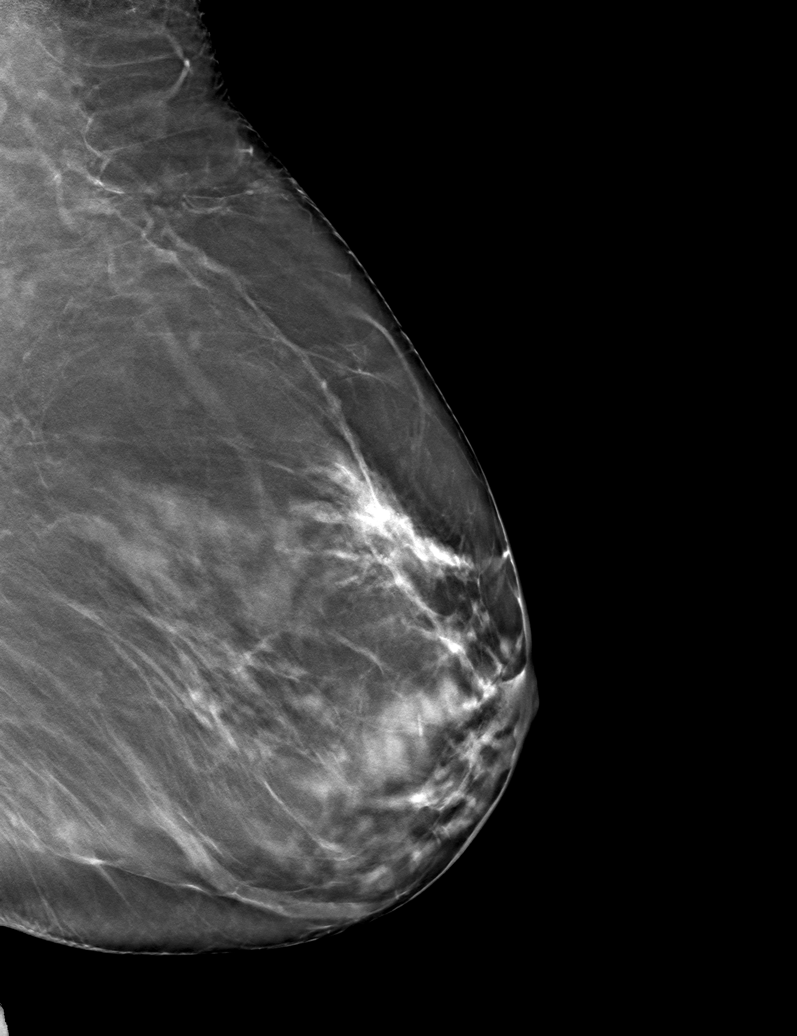

[6 of 30 positions shown; findings below may reference images not displayed]

ACR Breast Density Category b: There are scattered areas of
fibroglandular density.
FINDINGS: In the left breast, a possible asymmetry with distortion in the
upper-outer quadrant warrants further evaluation. In the right
breast, no findings suspicious for malignancy. Images were processed
with CAD.
IMPRESSION: Further evaluation is suggested for possible a possible asymmetry
with distortion in the left breast.

RECOMMENDATION:
Diagnostic mammogram and possibly ultrasound of the left breast.
(Code:[MU])

The patient will be contacted regarding the findings, and additional
imaging will be scheduled.

BI-RADS CATEGORY  0: Incomplete. Need additional imaging evaluation
and/or prior mammograms for comparison.

## 2019-11-22 ENCOUNTER — Other Ambulatory Visit: Payer: Self-pay | Admitting: Neurology

## 2019-11-22 NOTE — Telephone Encounter (Signed)
Patient is out of medication and is requesting a refill to get her to her appointment on 6/30.

## 2019-11-22 NOTE — Telephone Encounter (Signed)
I tried to call pt back, unable to reach pt. I spoke with [facility] pharmacist. He stated pt has refills on file for both Lamotrigine (last filled 5/31) and Lyrica (last refilled 11/02/19). He will fill the Lamotrigine today.   I tried to call the pt back. I LVM asking for call back. When she calls back, please let her know [facility] will go ahead and refill for the Lamotrigine for her but she should have plenty of Lyrica left since it was just filled on 11/02/19. The pharmacy does have refills on file for both of those medications when insurance will pay again.

## 2019-11-23 NOTE — Progress Notes (Signed)
PATIENT: Shelby Hill DOB: 1936/05/18  REASON FOR VISIT: follow up HISTORY FROM: patient  HISTORY OF PRESENT ILLNESS: Today 11/24/19  Ms. Tribbey is an 84 year old female with history of left V1 distribution sharp pains. She is felt to possibly have a SUNCT headache was treated with medication. She had dramatic response, had total elimitation of the head pain. She did have side effect of tremor on Lamictal. She remains on Lamictal has cut her dose to 25 mg twice daily. Her dose of Lyrica has been reduced, is taking 50 mg twice a day.  She is fearful to cut the dose of either medications any further.  At times if she sneezes or yawns, she may have a twinge of pain to her right upper forehead, will then radiate to the left V1 area around her eye.  Is mild at this point, not persistent.  Tremor is much improved as Lamictal has been reduced.  So much improved, she does not even notice it, only present when nervous or anxious.  Has some tremor today, she drove from [place], had a lot of traffic, was late.  Presents today unaccompanied.  HISTORY  07/15/2019 Dr. [name]: Shelby Hill is an 84 year old right-handed white female with a history of left V1 distribution sharp pains.  The patient did not respond fully to a combination of Lyrica and carbamazepine.  She was felt possibly to have a SUNCT headache, and was subsequently placed on lamotrigine.  The patient had a dramatic response to this medication, she has had total elimination of her head pain.  She has developed a slight tremor on the Lamictal, the medication was reduced to 50 mg in the morning and 100 mg in the evening.  The tremor has improved.  The patient does have some slight instability with walking on the Lyrica.  She has not had any falls.  Overall, she is pleased with how she is doing.  REVIEW OF SYSTEMS: Out of a complete 14 system review of symptoms, the patient complains only of the following symptoms, and all other reviewed systems are  negative.  Headache  ALLERGIES: Allergies  Allergen Reactions  . Codeine Nausea And Vomiting    HOME MEDICATIONS: Outpatient Medications Prior to Visit  Medication Sig Dispense Refill  . acetaminophen (TYLENOL) 500 MG tablet Take 500 mg by mouth every 6 (six) hours as needed for headache (pain).    Marland Kitchen ALPRAZolam (XANAX) 0.5 MG tablet Take 0.5 tablets (0.25 mg total) by mouth at bedtime as needed for anxiety or sleep. 3 tablet 0  . aspirin (ASPIRIN 81) 81 MG chewable tablet Chew by mouth daily.    . calcitonin, salmon, (MIACALCIN/FORTICAL) 200 UNIT/ACT nasal spray USE ONE SPRAY IN ONE NOSTRIL ONCE DAILY, ALTERNATING NOSTRILS    . Multiple Vitamin (MULTIVITAMIN WITH MINERALS) TABS Take 1 tablet by mouth daily.    . pregabalin (LYRICA) 50 MG capsule TAKE ONE CAPSULE BY MOUTH EVERY MORNING, TAKE TWO CAPSULES EVERY EVENING FOR TWO WEEKS THEN TAKE ONE CAPSULE TWICE DAILY (Patient taking differently: TAKE ONE CAPSULE BY MOUTH EVERY MORNING, TAKE one CAPSULES EVERY EVENING) 60 capsule 3  . rosuvastatin (CRESTOR) 5 MG tablet Take 5 mg by mouth at bedtime as needed.    . lamoTRIgine (LAMICTAL) 25 MG tablet Take 1 tablet (25 mg total) by mouth 2 (two) times daily. 60 tablet 3   No facility-administered medications prior to visit.    PAST MEDICAL HISTORY: Past Medical History:  Diagnosis Date  . BCC (basal cell  carcinoma) 02/09/1988   left upper thigh,left lower thigh  . BCC (basal cell carcinoma) 10/04/1962   left clavicle,left breast, Back  . BCC (basal cell carcinoma) 03/06/1989   mid back  . BCC (basal cell carcinoma) 04/02/1990   left upper shoulder (CX35FU),left upper breast(CX35FU),above left clavicle, right outer back,  . BCC (basal cell carcinoma) 04/01/1991   right cetner outer brown (CX3+exc. ), upper left chest,upper left chest medial, right back  . BCC (basal cell carcinoma) 02/01/1994   right back  . BCC (basal cell carcinoma) 03/26/1995   central upper back (CX35FU),left  upper back (CX35FU)  . BCC (basal cell carcinoma) 12/30/1997   right cheek (exc. )  . BCC (basal cell carcinoma) 08/06/1999   upper right shin (CX35FU), upper right shin (CX35FU)  . BCC (basal cell carcinoma) 08/24/1998   left breast (CX35FU)  . BCC (basal cell carcinoma) 08/05/2001   right inner cheek (MOHS), Left upper back (CX35FU),right upper back (CX35FU),, right top hand (CX35FU)  . BCC (basal cell carcinoma) 08/02/2002   upper back(CX35FU)  . BCC (basal cell carcinoma) 09/12/2003   right mid back (CX35FU), right thigh (deep freeze+aldara) Left thigh outer (deep freeze +aldara)  . BCC (basal cell carcinoma) 06/03/2005   rigth scapula (CX35FU),   . BCC (basal cell carcinoma) 06/05/2006   right ant. lateral thigh (CX35FU)  . BCC (basal cell carcinoma) 08/08/2010   right forearm (CX35FU)  . BCC (basal cell carcinoma) 07/01/2011   right shin (MOHS)  . BCC (basal cell carcinoma) 12/14/2013   left upper thigh(CX35FU), left upper arm (CX35FU), mid right back (CX35FU), lower right back (CX35FU)  . BCC (basal cell carcinoma) 02/28/2016   left forearm  . HLD (hyperlipidemia)   . 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 SCC (squamous cell carcinoma) 09/12/2003   upper back (CX35FU)  . SCC (squamous cell carcinoma) 06/03/2005   left v of neck (CX35FU), Left shin sup. (CX35FU)  . SCC (squamous cell carcinoma) 06/05/2006   mid chest (CX35FU)  . SCC (squamous cell carcinoma) 08/08/2010   left temple (CX35FU), Left upper arm (CX35FU), right forearm/wrist (CX35FU)  . SCC (squamous cell carcinoma) 02/06/2012   Left post neck-tx p bx  . SCC (squamous cell carcinoma) 11/09/2012   left hand-tx p bx-, left chest sup -tx p bx, left chest inf. (CX35FU)  . SCC (squamous cell carcinoma) 08/04/2014   left clavicle-tx p bx  . SCC (squamous cell carcinoma) 11/30/2014   lower right leg distal (CX35FU), lower right leg, prox. (CX35FU)  . SCC (squamous cell carcinoma)  05/17/2015   right jawline (CX35FU), Left sideburn (CX35FU)  . SCC (squamous cell carcinoma) 09/10/2017   left forearm-tx p bx, left temple -tx p bx    PAST SURGICAL HISTORY: Past Surgical History:  Procedure Laterality Date  . COLONOSCOPY  01/01/2012   Procedure: COLONOSCOPY;  Surgeon: Rogene Houston, MD;  [facility];  Service: Endoscopy;  Laterality: N/A;  830  . INTRAMEDULLARY (IM) NAIL INTERTROCHANTERIC Left 04/26/2017   Procedure: INTRAMEDULLARY (IM) NAIL INTERTROCHANTRIC;  Surgeon: Leandrew Koyanagi, MD;  Location: [facility];  Service: Orthopedics;  Laterality: Left;  . KNEE SURGERY    . ORIF TIBIA PLATEAU      FAMILY HISTORY: Family History  Problem Relation Age of Onset  . CAD Mother   . Stroke Sister   . Stroke Brother     SOCIAL HISTORY: Social History   Socioeconomic History  . Marital status: Married  Spouse name: Not on file  . Number of children: Not on file  . Years of education: Not on file  . Highest education level: Not on file  Occupational History  . Not on file  Tobacco Use  . Smoking status: Never Smoker  . Smokeless tobacco: Never Used  Substance and Sexual Activity  . Alcohol use: Yes    Comment: Occasionally  . Drug use: No  . Sexual activity: Not on file  Other Topics Concern  . Not on file  Social History Narrative   ** Merged History Encounter **    Right handed   Caffeine~ 1-2 cups per day    Social Determinants of Health   Financial Resource Strain:   . Difficulty of Paying Living Expenses:   Food Insecurity:   . Worried About Charity fundraiser in the Last Year:   . Arboriculturist in the Last Year:   Transportation Needs:   . Film/video editor (Medical):   Marland Kitchen Lack of Transportation (Non-Medical):   Physical Activity:   . Days of Exercise per Week:   . Minutes of Exercise per Session:   Stress:   . Feeling of Stress :   Social Connections:   . Frequency of Communication with Friends and Family:   . Frequency  of Social Gatherings with Friends and Family:   . Attends Religious Services:   . Active Member of Clubs or Organizations:   . Attends Archivist Meetings:   Marland Kitchen Marital Status:   Intimate Partner Violence:   . Fear of Current or Ex-Partner:   . Emotionally Abused:   Marland Kitchen Physically Abused:   . Sexually Abused:    PHYSICAL EXAM  Vitals:   11/24/19 1425  BP: 134/74  Pulse: 84  Weight: 173 lb (78.5 kg)  Height: 5\' 6"  (1.676 m)   Body mass index is 27.92 kg/m.  Generalized: Well developed, in no acute distress   Neurological examination  Mentation: Alert oriented to time, place, history taking. Follows all commands speech and language fluent Cranial nerve II-XII: Pupils were equal round reactive to light. Extraocular movements were full, visual field were full on confrontational test. Facial sensation and strength were normal. Head turning and shoulder shrug  were normal and symmetric. Motor: The motor testing reveals 5 over 5 strength of all 4 extremities. Good symmetric motor tone is noted throughout.  Sensory: Sensory testing is intact to soft touch on all 4 extremities. No evidence of extinction is noted.  Coordination: Cerebellar testing reveals good finger-nose-finger and heel-to-shin bilaterally.  Mild tremor noted bilaterally with finger-nose-finger. Gait and station: Gait is normal. Tandem gait is slightly unsteady. Romberg is negative. No drift is seen.    DIAGNOSTIC DATA (LABS, IMAGING, TESTING) - I reviewed patient records, labs, notes, testing and imaging myself where available.  Lab Results  Component Value Date   WBC 6.3 12/09/2018   HGB 13.9 12/09/2018   HCT 41.9 12/09/2018   MCV 92 12/09/2018   PLT 194 12/09/2018      Component Value Date/Time   NA 144 12/09/2018 1549   K 4.0 12/09/2018 1549   CL 104 12/09/2018 1549   CO2 24 12/09/2018 1549   GLUCOSE 97 12/09/2018 1549   GLUCOSE 98 04/28/2017 0456   BUN 18 12/09/2018 1549   CREATININE 0.65  12/09/2018 1549   CALCIUM 9.8 12/09/2018 1549   PROT 7.2 12/09/2018 1549   ALBUMIN 4.4 12/09/2018 1549   AST 18 12/09/2018 1549  ALT 10 12/09/2018 1549   ALKPHOS 71 12/09/2018 1549   BILITOT 0.6 12/09/2018 1549   GFRNONAA 84 12/09/2018 1549   GFRAA 97 12/09/2018 1549   No results found for: CHOL, HDL, LDLCALC, LDLDIRECT, TRIG, CHOLHDL No results found for: HGBA1C No results found for: VITAMINB12 No results found for: TSH    ASSESSMENT AND PLAN 84 y.o. year old female  has a past medical history of BCC (basal cell carcinoma) (02/09/1988), BCC (basal cell carcinoma) (10/04/1962), BCC (basal cell carcinoma) (03/06/1989), BCC (basal cell carcinoma) (04/02/1990), BCC (basal cell carcinoma) (04/01/1991), BCC (basal cell carcinoma) (02/01/1994), BCC (basal cell carcinoma) (03/26/1995), BCC (basal cell carcinoma) (12/30/1997), BCC (basal cell carcinoma) (08/06/1999), BCC (basal cell carcinoma) (08/24/1998), BCC (basal cell carcinoma) (08/05/2001), BCC (basal cell carcinoma) (08/02/2002), BCC (basal cell carcinoma) (09/12/2003), BCC (basal cell carcinoma) (06/03/2005), BCC (basal cell carcinoma) (06/05/2006), BCC (basal cell carcinoma) (08/08/2010), BCC (basal cell carcinoma) (07/01/2011), BCC (basal cell carcinoma) (12/14/2013), BCC (basal cell carcinoma) (02/28/2016), HLD (hyperlipidemia), SCC (squamous cell carcinoma) (08/11/2000), SCC (squamous cell carcinoma) (09/12/2003), SCC (squamous cell carcinoma) (06/03/2005), SCC (squamous cell carcinoma) (06/05/2006), SCC (squamous cell carcinoma) (08/08/2010), SCC (squamous cell carcinoma) (02/06/2012), SCC (squamous cell carcinoma) (11/09/2012), SCC (squamous cell carcinoma) (08/04/2014), SCC (squamous cell carcinoma) (11/30/2014), SCC (squamous cell carcinoma) (05/17/2015), and SCC (squamous cell carcinoma) (09/10/2017). here with:  1.  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  At this point, she is fearful to drop the doses any lower.  She did develop a tremor, felt to be related to Lamictal, as the dose has been reduced, the tremor has dramatically improved, is only noted during times of stress or anxiety now.  Mild tremor was noted today, she had a lot of stress driving to this appointment.  At this point, she will remain on current doses of medication.  If we did anything, we may try slightly lower dose of Lyrica 25 mg twice daily, or even 1/2 tablet Lamictal twice a day.  She will follow-up in 6 months or sooner if needed.  Incidence of tremor with Lyrica was noted to be anywhere from 1-11%, Lamictal was 4 to 10%.  I spent 30 minutes of face-to-face and non-face-to-face time with patient.  This included previsit chart review, lab review, study review, order entry, electronic health record documentation, patient education.  [name], AGNP-C, DNP 11/24/2019, 2:51 PM [facility] [address] [place], [place] [address] [phone]

## 2019-11-24 ENCOUNTER — Other Ambulatory Visit: Payer: Self-pay | Admitting: Internal Medicine

## 2019-11-24 ENCOUNTER — Encounter: Payer: Self-pay | Admitting: Neurology

## 2019-11-24 ENCOUNTER — Ambulatory Visit: Payer: Medicare PPO | Admitting: Neurology

## 2019-11-24 VITALS — BP 134/74 | HR 84 | Ht 66.0 in | Wt 173.0 lb

## 2019-11-24 DIAGNOSIS — G500 Trigeminal neuralgia: Secondary | ICD-10-CM

## 2019-11-24 DIAGNOSIS — R928 Other abnormal and inconclusive findings on diagnostic imaging of breast: Secondary | ICD-10-CM

## 2019-11-24 MED ORDER — LAMOTRIGINE 25 MG PO TABS: 25.0000 mg | ORAL_TABLET | Freq: Two times a day (BID) | ORAL | 6 refills | Status: DC

## 2019-11-24 NOTE — Patient Instructions (Signed)
Continue current medications  Keep an eye on the tremor We will see you back in 6 months

## 2019-11-25 NOTE — Progress Notes (Signed)
I have read the note, and I agree with the clinical assessment and plan.  Naira Standiford K Chinelo Benn   

## 2019-12-08 ENCOUNTER — Ambulatory Visit
Admission: RE | Admit: 2019-12-08 | Discharge: 2019-12-08 | Disposition: A | Discharge status: Home / Self Care | Payer: Medicare PPO | Source: Ambulatory Visit | Attending: Internal Medicine | Admitting: Internal Medicine

## 2019-12-08 ENCOUNTER — Ambulatory Visit: Payer: Medicare PPO

## 2019-12-08 ENCOUNTER — Other Ambulatory Visit: Payer: Self-pay

## 2019-12-08 DIAGNOSIS — R928 Other abnormal and inconclusive findings on diagnostic imaging of breast: Secondary | ICD-10-CM

## 2019-12-08 IMAGING — MG MM DIGITAL DIAGNOSTIC UNILAT*L* W/ TOMO W/ CAD
4 series · 4 of 12 positions shown · non-contrast
Comparison: Previous exams.

CLINICAL DATA: Screening recall for left breast asymmetry.

EXAM:
DIGITAL DIAGNOSTIC UNILATERAL LEFT MAMMOGRAM WITH TOMO AND CAD

[L MLO synth-2D]
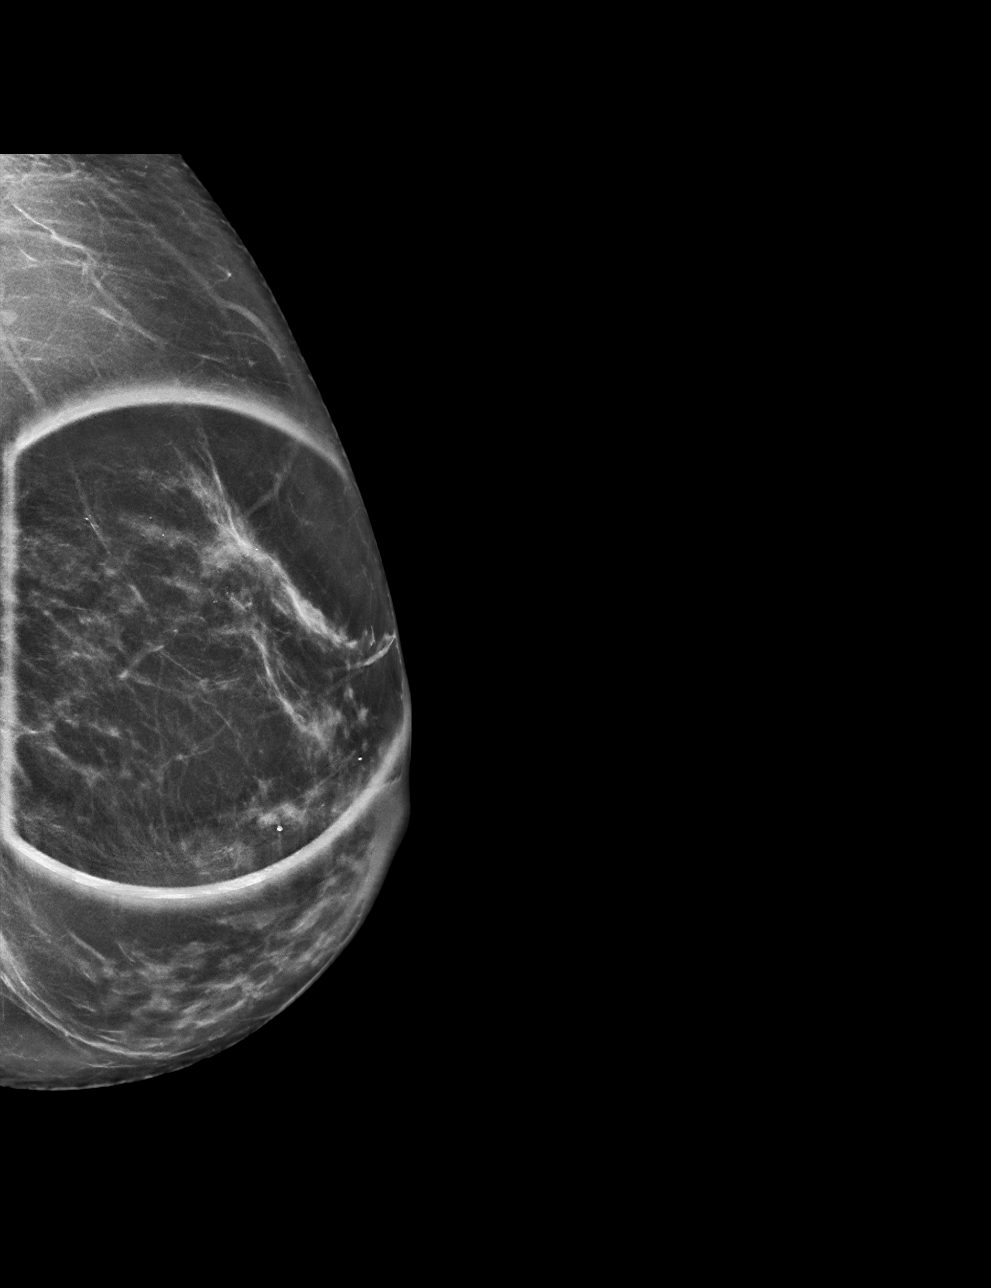

[L CC synth-2D]
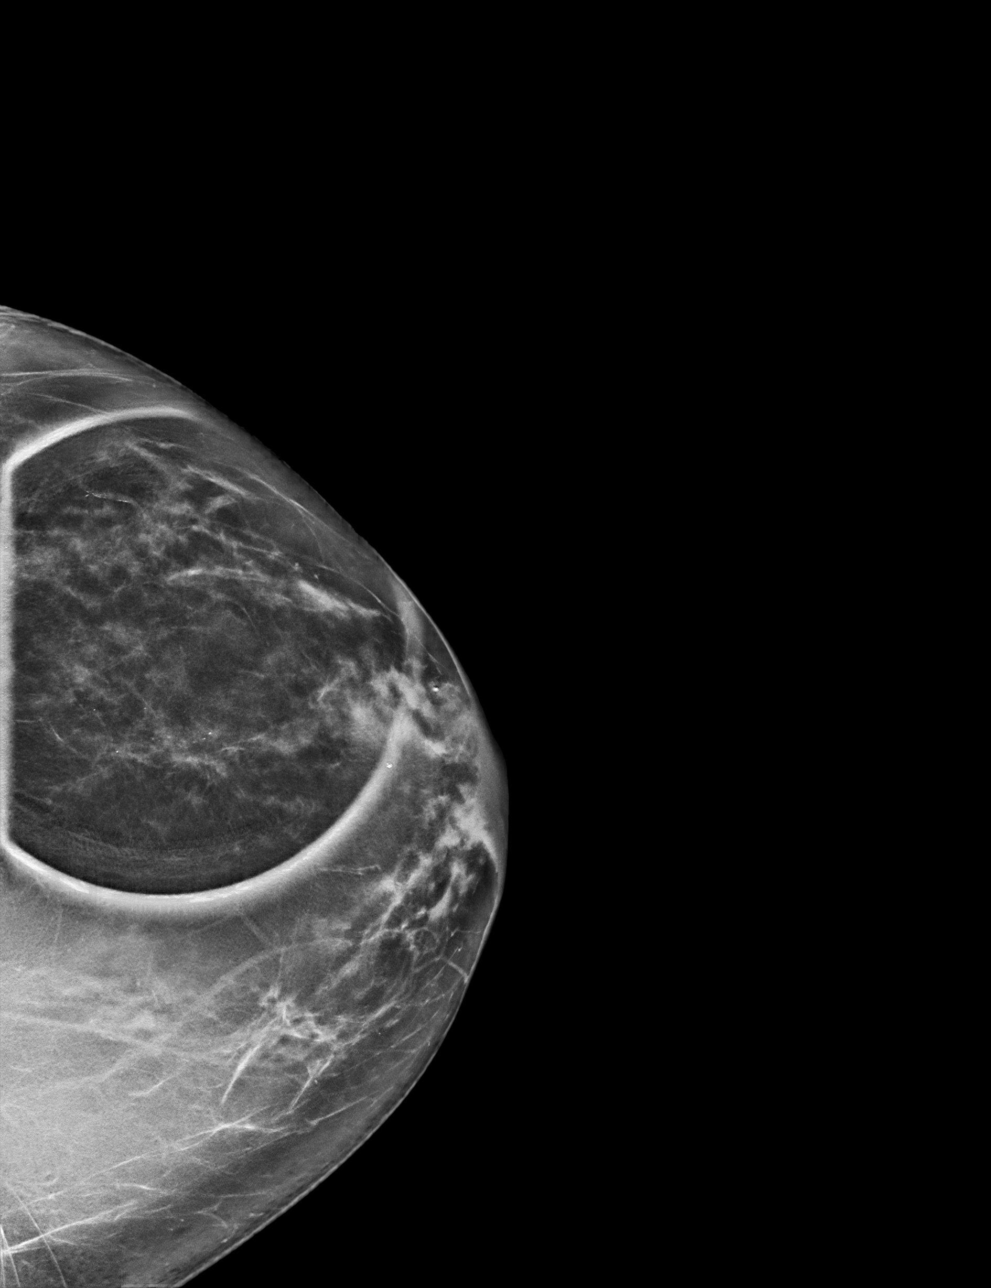

[L MLO tomo · tomo slice 37/72.0]
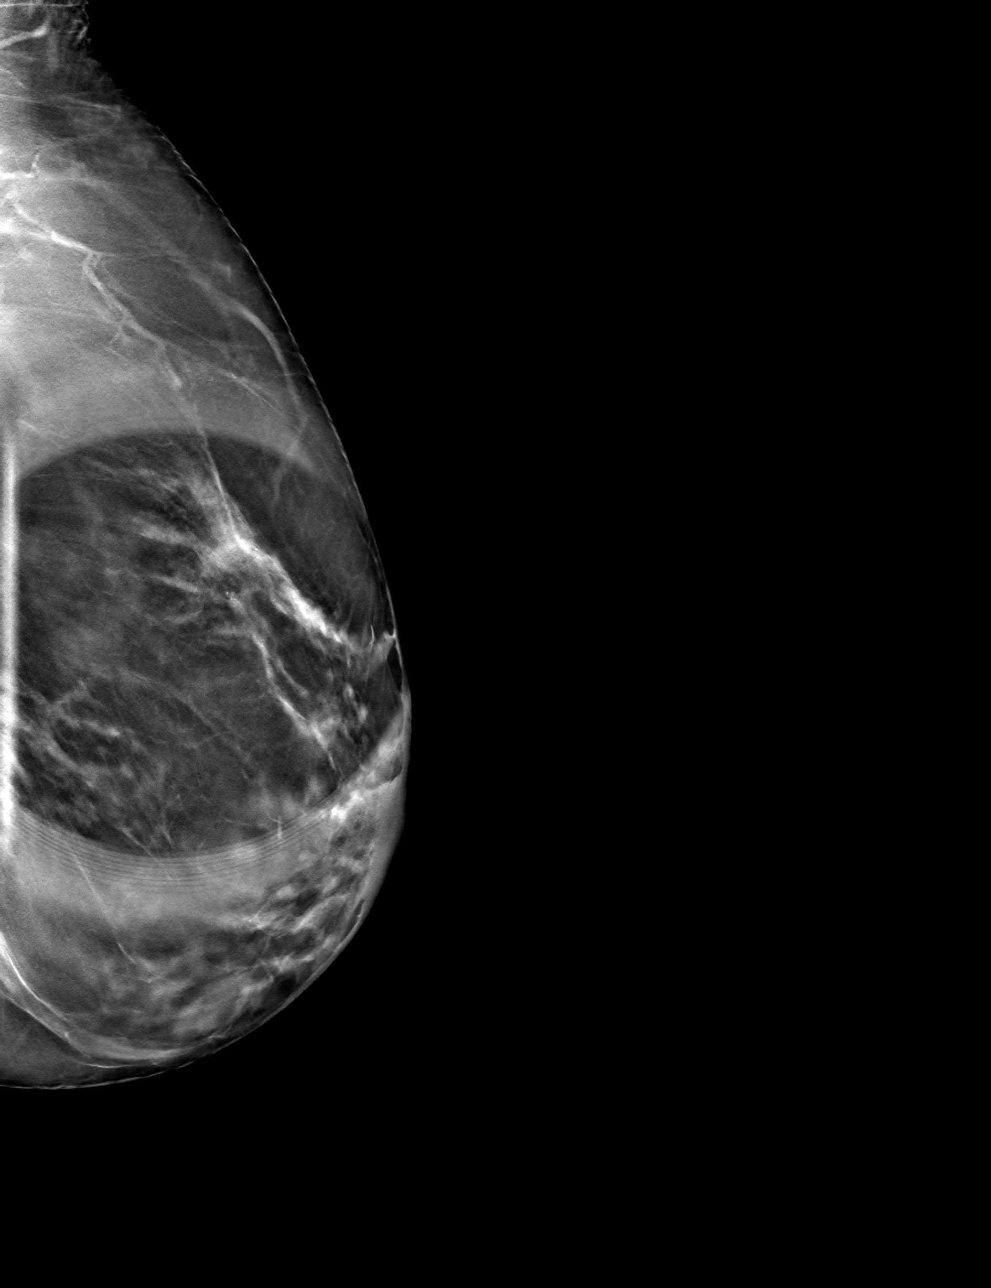

[L CC tomo · tomo slice 32/63.0]
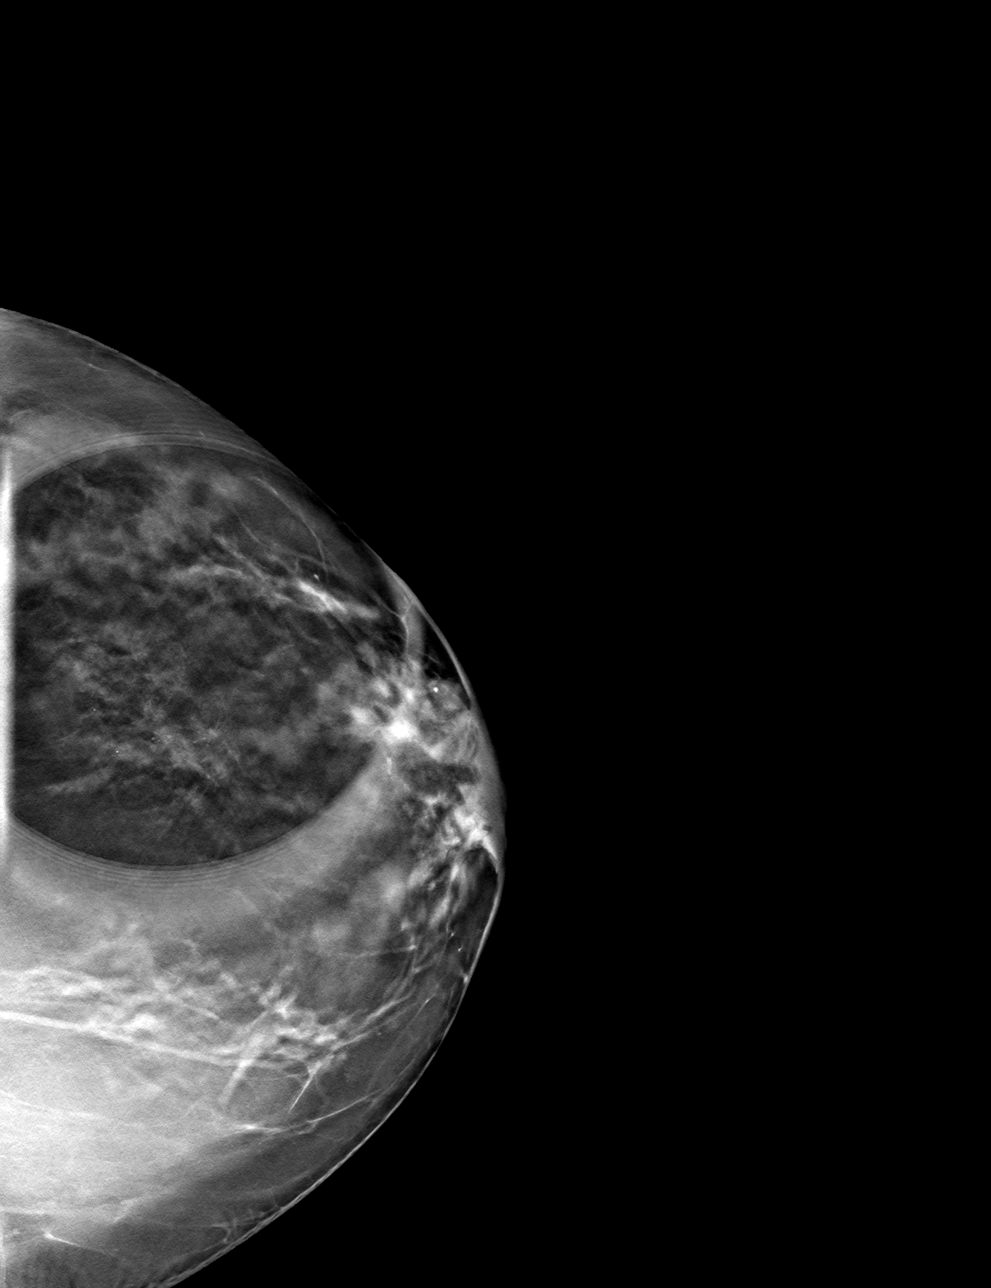

[4 of 12 positions shown; findings below may reference images not displayed]

ACR Breast Density Category b: There are scattered areas of
fibroglandular density.
FINDINGS: Additional tomograms were performed of the left breast. No
persistent suspicious masses or abnormalities identified at the
initially questioned area of concern in the left breast. The
fibroglandular pattern is stable in appearance when compared to
remote mammograms dating back to [AL]. There is no mammographic
evidence of malignancy in the left breast.

Mammographic images were processed with CAD.
IMPRESSION: No mammographic evidence of malignancy in the left breast.

RECOMMENDATION:
Screening mammogram in one year.(Code:[AL])

I have discussed the findings and recommendations with the patient.
If applicable, a reminder letter will be sent to the patient
regarding the next appointment.

BI-RADS CATEGORY  1: Negative.

## 2019-12-14 ENCOUNTER — Telehealth: Payer: Self-pay | Admitting: Neurology

## 2019-12-14 NOTE — Telephone Encounter (Signed)
Pt is asking for a call to discuss forms of related medications to Botox

## 2019-12-15 NOTE — Telephone Encounter (Signed)
Chart reviewed, last seen, she has probable SUNCT headache, left V1, she was doing well on Lamictal and Lyrica.  Unclear exactly what kind of procedure she is referring to, difficult to say whether it contributed to her symptom onset? Reviewed her initial note, 12/09/18, pain initially started in the left frontal area around the left eye, if this was the area of the procedure, I may be leery to undergo this cosmetic procedure again, however is hard to say for certain.

## 2019-12-15 NOTE — Telephone Encounter (Signed)
I called the patient.  It sounds like there is some sort of cosmetic infusion of dye for the eyebrows without tattooing, it may be a pressurized air system.  I think that there is probably no contraindication to having this done if the patient wishes to do it.  The patient will try to call and get more information about the actual procedure that is involved with this.

## 2019-12-15 NOTE — Telephone Encounter (Signed)
Called pt.  She has had procedure (does not know name) of having color placed at her eyebrows (filler), nothing injected, no breaking of skin.  Pt hears click, feels something.  She has had 2 times.  This color last about 1-2 yrs.  She did say that her TN may have started a yr after having this done.  She is wanting to have done again and is asking if this could affect TN (since was asked about if has had botox before).  Please advise.

## 2019-12-22 NOTE — Progress Notes (Signed)
CHECK MEDIA FOR PHOTO OF ALL BCC OR SCC.

## 2019-12-28 ENCOUNTER — Other Ambulatory Visit: Payer: Self-pay

## 2019-12-28 ENCOUNTER — Ambulatory Visit: Payer: Medicare PPO | Admitting: Dermatology

## 2019-12-28 ENCOUNTER — Encounter: Payer: Self-pay | Admitting: Dermatology

## 2019-12-28 DIAGNOSIS — L820 Inflamed seborrheic keratosis: Secondary | ICD-10-CM

## 2020-02-08 ENCOUNTER — Encounter: Payer: Self-pay | Admitting: Dermatology

## 2020-02-08 NOTE — Progress Notes (Signed)
   Follow-Up Visit   Subjective  Shelby Hill is a 84 y.o. female who presents for the following: Follow-up (pt stated--forehead --spot itching, dry, discoloration---3 months).  New spot Location: Forehead Duration: 3 months Quality:  Associated Signs/Symptoms: Modifying Factors:  Severity:  Timing: Context: History of multiple nonmelanoma skin cancers  Objective  Well appearing patient in no apparent distress; mood and affect are within normal limits.  A focused examination was performed including Head and neck.. Relevant physical exam findings are noted in the Assessment and Plan.   Assessment & Plan    Inflamed seborrheic keratosis Head - Anterior (Face)  For now we will leave this lesion, but we may choose freezing in the future or biopsy if it grows or bleeds.     I, Lavonna Monarch, MD, have reviewed all documentation for this visit.  The documentation on 02/08/20 for the exam, diagnosis, procedures, and orders are all accurate and complete.

## 2020-02-15 ENCOUNTER — Other Ambulatory Visit: Payer: Self-pay | Admitting: Neurology

## 2020-02-24 ENCOUNTER — Other Ambulatory Visit: Payer: Self-pay | Admitting: Neurology

## 2020-04-12 ENCOUNTER — Telehealth: Payer: Self-pay | Admitting: Neurology

## 2020-04-12 MED ORDER — PREGABALIN 50 MG PO CAPS: ORAL_CAPSULE | ORAL | 0 refills | Status: DC

## 2020-04-12 NOTE — Telephone Encounter (Signed)
Pt. states that it may be caused by stress, but she is having a lot of facial pain. The pain is instant, not lasting & effects her left side of face. Her nostril pops & eye blinks more. She is asking if either medications can be raised to help the pain. Please advise. May leave vm on home number, but if unable to reach, may call her cell: [phone].  Pharmacy: [facility]

## 2020-04-12 NOTE — Telephone Encounter (Signed)
Next appt 57mo 05-24-20 w/ SS/NP.  Taking lyrica 50mg  po bid, and lamotrigine 25mg  po bid.

## 2020-04-12 NOTE — Addendum Note (Signed)
Addended by: Brandon Melnick on: 04/12/2020 03:10 PM   Modules accepted: Orders

## 2020-04-12 NOTE — Telephone Encounter (Signed)
What about adding in extra Lyrica to take in the evening, keep 50 mg am, 100 mg pm, can take the extra in evening as needed. Hesitate to increase Lamictal, felt tremor was related, tremor improved greatly on lower dose of Lamictal.

## 2020-04-12 NOTE — Telephone Encounter (Signed)
I called pt back.  She states the TN sharp shooting pain has increased 7-8 days ago, due to stress??  L eye and nostril area.  I confirmed what she is taking as per listed below.  Instructed on new plan, take lyrica 50mg  po am and 100mg  po pm.  Keep lamictal same 25mg  po bid (due to potential tremor increase).  She verbalized understanding.

## 2020-04-12 NOTE — Addendum Note (Signed)
Addended by: Suzzanne Cloud on: 04/12/2020 04:38 PM   Modules accepted: Orders

## 2020-04-24 ENCOUNTER — Telehealth: Payer: Self-pay | Admitting: Neurology

## 2020-04-24 MED ORDER — LAMOTRIGINE 25 MG PO TABS: ORAL_TABLET | ORAL | 1 refills | Status: DC

## 2020-04-24 NOTE — Telephone Encounter (Signed)
If she wants to try higher dose Lamictal, we can, but needs to watch for worsening tremor. Maybe Lamictal 25 mg am, 50 mg pm to see if benefit?

## 2020-04-24 NOTE — Telephone Encounter (Signed)
I called pt and relayed the plan increase for lamotrigine 25mg  po am and 50-mg po pm.  She will see how she does.  She feels that the pregabalin was the medication causing her issues with tremor.  She has not had any issue at this time.

## 2020-04-24 NOTE — Addendum Note (Signed)
Addended by: Brandon Melnick on: 04/24/2020 11:58 AM   Modules accepted: Orders

## 2020-04-24 NOTE — Telephone Encounter (Signed)
I called pt and she relayed that she feels like she needs to go up on the lamotrigine 25mg  po bid and this dose is not helping.  She stated that previous higher dose for her worked at stopping the pain in 2-3 days.  (but from her end Dr. [name] wanted her to come down to lowest dose she could do to give her benefit).  She wants to stay at the current dose of pregabalin 50mg  am 100mg  pm.  Please advise.

## 2020-04-24 NOTE — Telephone Encounter (Signed)
Pt called wanting to speak to the RN regarding her  lamoTRIgine (LAMICTAL) 25 MG tablet and her pregabalin (LYRICA) 50 MG capsule. Pt states that she is needing an increase in dosage due to her feeling that they are not working at the dosage that she is taking now. Please advise.

## 2020-05-03 NOTE — Telephone Encounter (Signed)
Pt returned phone call.  

## 2020-05-03 NOTE — Telephone Encounter (Signed)
Pt called again wanting to speak to the RN. Pt wants to know how long before she notices any changes. Pt states she has been taking the higher dosages of her Lamotrigine for a bit over a week and still has not notice any changes. Pt states she is still in pain. Please advise.

## 2020-05-03 NOTE — Telephone Encounter (Signed)
Called no answer

## 2020-05-03 NOTE — Telephone Encounter (Signed)
Check on how tremor is doing with higher dose Lamictal 25/50 mg (11/29). We also increased Lyrica 50/100 mg daily (11/17). Which has been more helpful? Also, I see prednisone has been given taper, we could try that if was helpful? Options, go higher on Lamictal or Lyrica, or try prednisone taper. Pain has previously been well controlled.

## 2020-05-03 NOTE — Telephone Encounter (Signed)
LMVM for pt to return call.   

## 2020-05-04 MED ORDER — PREDNISONE 5 MG PO TABS: ORAL_TABLET | ORAL | 0 refills | Status: DC

## 2020-05-04 NOTE — Telephone Encounter (Signed)
I would take the Lyrica for a few days, but will send in prednisone to have on hand just in case.

## 2020-05-04 NOTE — Telephone Encounter (Signed)
I called pt and she was taking both meds as ordered but not the additional 50mg  lyrica at night.  She will take this starting now, but is also interested to have the prednisone taper just in case it does not help her ( since over the weekend).  Please advise.

## 2020-05-04 NOTE — Addendum Note (Signed)
Addended by: Suzzanne Cloud on: 05/04/2020 10:18 AM   Modules accepted: Orders

## 2020-05-04 NOTE — Telephone Encounter (Signed)
I called pt and let her know to give increased lyrica a trial then if needed over the w/e to use prednisone .  She verbalized understanding.

## 2020-05-12 DIAGNOSIS — Z299 Encounter for prophylactic measures, unspecified: Secondary | ICD-10-CM | POA: Diagnosis not present

## 2020-05-12 DIAGNOSIS — F419 Anxiety disorder, unspecified: Secondary | ICD-10-CM | POA: Diagnosis not present

## 2020-05-12 DIAGNOSIS — G4700 Insomnia, unspecified: Secondary | ICD-10-CM | POA: Diagnosis not present

## 2020-05-12 DIAGNOSIS — I10 Essential (primary) hypertension: Secondary | ICD-10-CM | POA: Diagnosis not present

## 2020-05-12 DIAGNOSIS — G500 Trigeminal neuralgia: Secondary | ICD-10-CM | POA: Diagnosis not present

## 2020-05-24 ENCOUNTER — Other Ambulatory Visit: Payer: Self-pay

## 2020-05-24 ENCOUNTER — Ambulatory Visit: Payer: Medicare PPO | Admitting: Neurology

## 2020-05-24 ENCOUNTER — Encounter: Payer: Self-pay | Admitting: Neurology

## 2020-05-24 VITALS — BP 130/78 | HR 90 | Ht 66.0 in | Wt 178.0 lb

## 2020-05-24 DIAGNOSIS — G500 Trigeminal neuralgia: Secondary | ICD-10-CM

## 2020-05-24 MED ORDER — DULOXETINE HCL 30 MG PO CPEP: 30.0000 mg | ORAL_CAPSULE | Freq: Every day | ORAL | 5 refills | Status: DC

## 2020-05-24 NOTE — Progress Notes (Signed)
I have read the note, and I agree with the clinical assessment and plan.  Shandon Matson K Jolynda Townley   

## 2020-05-24 NOTE — Patient Instructions (Signed)
Try Cymbalta 30 once daily for anxiety, and facial pain  Continue Lamictal and Lyrica Let me know in 4-6 weeks how you are doing  See you back in 4 months

## 2020-05-24 NOTE — Progress Notes (Signed)
PATIENT: Shelby Hill DOB: 09-Jul-1935  REASON FOR VISIT: follow up HISTORY FROM: patient  HISTORY OF PRESENT ILLNESS: Today 05/24/20 Shelby Hill is an 84 year old female with history of left V1 distribution sharp pains, is felt to possibly have a SUNCT headache.  Has had side effect of tremor, which is felt to be related to Lamictal.  Back in June, she was able to cut her doses of Lamictal (25/25) and Lyrica (50/50) back.  However of recent, she has had more pain we had increased. Currently on Lamictal 25/50 (11/29), Lyrica 50/100 (11/17), but she increased to Lamictal 50 mg twice a day, Lyrica 100 mg twice a day.  A few weeks ago called her PCP, added Xanax.  Feels the pain is related to her anxiety, was worried with the holidays.  The Xanax has been quite helpful, pain is better controlled, but still has the sharp twinges of pain to the left to be one area.  Does not notice tremor with higher doses of medications. Didn't take prednisone. Anxiety is an issue for her. Here today for follow-up unaccompanied.   HISTORY  11/24/2019 SS: Shelby Hill is an 84 year old female with history of left V1 distribution sharp pains. She is felt to possibly have a SUNCT headache was treated with medication. She had dramatic response, had total elimitation of the head pain. She did have side effect of tremor on Lamictal. She remains on Lamictal has cut her dose to 25 mg twice daily. Her dose of Lyrica has been reduced, is taking 50 mg twice a day.  She is fearful to cut the dose of either medications any further.  At times if she sneezes or yawns, she may have a twinge of pain to her right upper forehead, will then radiate to the left V1 area around her eye.  Is mild at this point, not persistent.  Tremor is much improved as Lamictal has been reduced.  So much improved, she does not even notice it, only present when nervous or anxious.  Has some tremor today, she drove from [place], had a lot of traffic, was late.  Presents  today unaccompanied.  REVIEW OF SYSTEMS: Out of a complete 14 system review of symptoms, the patient complains only of the following symptoms, and all other reviewed systems are negative.  Facial pain  ALLERGIES: Allergies  Allergen Reactions   Codeine Nausea And Vomiting    HOME MEDICATIONS: Outpatient Medications Prior to Visit  Medication Sig Dispense Refill   ALPRAZolam (XANAX) 0.5 MG tablet Take 0.5 tablets (0.25 mg total) by mouth at bedtime as needed for anxiety or sleep. 3 tablet 0   calcitonin, salmon, (MIACALCIN/FORTICAL) 200 UNIT/ACT nasal spray USE ONE SPRAY IN ONE NOSTRIL ONCE DAILY, ALTERNATING NOSTRILS     lamoTRIgine (LAMICTAL) 25 MG tablet Take 25mg  by mouth in am and 50mg  by mouth in pm (Patient taking differently: Take 50 mg by mouth in am and 50mg  by mouth in pm) 90 tablet 1   pregabalin (LYRICA) 50 MG capsule 1 capsule in AM and 2 capsules in PM (Patient taking differently: 2 capsule in AM and 2 capsules in PM) 270 capsule 0   rosuvastatin (CRESTOR) 5 MG tablet Take 5 mg by mouth at bedtime as needed.     acetaminophen (TYLENOL) 500 MG tablet Take 500 mg by mouth every 6 (six) hours as needed for headache (pain).     aspirin 81 MG chewable tablet Chew by mouth daily.     Multiple Vitamin (  MULTIVITAMIN WITH MINERALS) TABS Take 1 tablet by mouth daily.     predniSONE (DELTASONE) 5 MG tablet Begin taking 6 tablets, then taper by 1 tablet daily until off the medication 21 tablet 0   No facility-administered medications prior to visit.    PAST MEDICAL HISTORY: Past Medical History:  Diagnosis Date   BCC (basal cell carcinoma) 02/09/1988   left upper thigh,left lower thigh   BCC (basal cell carcinoma) 10/04/1962   left clavicle,left breast, Back   BCC (basal cell carcinoma) 03/06/1989   mid back   BCC (basal cell carcinoma) 04/02/1990   left upper shoulder (CX35FU),left upper breast(CX35FU),above left clavicle, right outer back,   BCC (basal cell  carcinoma) 04/01/1991   right cetner outer brown (CX3+exc. ), upper left chest,upper left chest medial, right back   BCC (basal cell carcinoma) 02/01/1994   right back   BCC (basal cell carcinoma) 03/26/1995   central upper back (CX35FU),left upper back (CX35FU)   BCC (basal cell carcinoma) 12/30/1997   right cheek (exc. )   BCC (basal cell carcinoma) 08/06/1999   upper right shin (CX35FU), upper right shin (CX35FU)   BCC (basal cell carcinoma) 08/24/1998   left breast (CX35FU)   BCC (basal cell carcinoma) 08/05/2001   right inner cheek (MOHS), Left upper back (CX35FU),right upper back (CX35FU),, right top hand (CX35FU)   BCC (basal cell carcinoma) 08/02/2002   upper back(CX35FU)   BCC (basal cell carcinoma) 09/12/2003   right mid back (CX35FU), right thigh (deep freeze+aldara) Left thigh outer (deep freeze +aldara)   BCC (basal cell carcinoma) 06/03/2005   rigth scapula (CX35FU),    BCC (basal cell carcinoma) 06/05/2006   right ant. 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 (CX35FU)   SCC (squamous cell carcinoma) 06/05/2006   mid chest (CX35FU)   SCC (squamous cell carcinoma) 08/08/2010   left temple (CX35FU), Left upper arm (CX35FU), right forearm/wrist (CX35FU)   SCC (squamous cell carcinoma) 02/06/2012   Left post neck-tx p bx   SCC (squamous cell carcinoma) 11/09/2012   left hand-tx p bx-, left chest sup -tx p bx,  left chest inf. (CX35FU)   SCC (squamous cell carcinoma) 08/04/2014   left clavicle-tx p bx   SCC (squamous cell carcinoma) 11/30/2014   lower right leg distal (CX35FU), lower right leg, prox. (CX35FU)   SCC (squamous cell carcinoma) 05/17/2015   right jawline (CX35FU), Left sideburn (CX35FU)   SCC (squamous cell carcinoma) 09/10/2017   left forearm-tx p bx, left temple -tx p bx    PAST SURGICAL HISTORY: Past Surgical History:  Procedure Laterality Date   COLONOSCOPY  01/01/2012   Procedure: COLONOSCOPY;  Surgeon: Rogene Houston, MD;  Location: [facility];  Service: Endoscopy;  Laterality: N/A;  830   INTRAMEDULLARY (IM) NAIL INTERTROCHANTERIC Left 04/26/2017   Procedure: INTRAMEDULLARY (IM) NAIL INTERTROCHANTRIC;  Surgeon: Leandrew Koyanagi, MD;  Location: [facility];  Service:  Orthopedics;  Laterality: Left;   KNEE SURGERY     ORIF TIBIA PLATEAU      FAMILY HISTORY: Family History  Problem Relation Age of Onset   CAD Mother    Stroke Sister    Stroke Brother     SOCIAL HISTORY: Social History   Socioeconomic History   Marital status: Married    Spouse name: Not on file   Number of children: Not on file   Years of education: Not on file   Highest education level: Not on file  Occupational History   Not on file  Tobacco Use   Smoking status: Never Smoker   Smokeless tobacco: Never Used  Substance and Sexual Activity   Alcohol use: Yes    Comment: Occasionally   Drug use: No   Sexual activity: Not on file  Other Topics Concern   Not on file  Social History Narrative   ** Merged History Encounter **    Right handed   Caffeine~ 1-2 cups per day    Social Determinants of Health   Financial Resource Strain: Not on file  Food Insecurity: Not on file  Transportation Needs: Not on file  Physical Activity: Not on file  Stress: Not on file  Social Connections: Not on file  Intimate Partner Violence: Not on file   PHYSICAL EXAM  Vitals:    05/24/20 1406  BP: 130/78  Pulse: 90  Weight: 178 lb (80.7 kg)  Height: 5\' 6"  (1.676 m)   Body mass index is 28.73 kg/m.  Generalized: Well developed, in no acute distress   Neurological examination  Mentation: Alert oriented to time, place, history taking. Follows all commands speech and language fluent Cranial nerve II-XII: Pupils were equal round reactive to light. Extraocular movements were full, visual field were full on confrontational test. Facial sensation and strength were normal. Head turning and shoulder shrug  were normal and symmetric. Motor: The motor testing reveals 5 over 5 strength of all 4 extremities. Good symmetric motor tone is noted throughout.  Sensory: Sensory testing is intact to soft touch on all 4 extremities. No evidence of extinction is noted.  Coordination: Cerebellar testing reveals good finger-nose-finger and heel-to-shin bilaterally. Mild tremor with finger nose finger bilaterally.  Gait and station: Gait is normal.  Reflexes: Deep tendon reflexes are symmetric and normal bilaterally.   DIAGNOSTIC DATA (LABS, IMAGING, TESTING) - I reviewed patient records, labs, notes, testing and imaging myself where available.  Lab Results  Component Value Date   WBC 6.3 12/09/2018   HGB 13.9 12/09/2018   HCT 41.9 12/09/2018   MCV 92 12/09/2018   PLT 194 12/09/2018      Component Value Date/Time   NA 144 12/09/2018 1549   K 4.0 12/09/2018 1549   CL 104 12/09/2018 1549   CO2 24 12/09/2018 1549   GLUCOSE 97 12/09/2018 1549   GLUCOSE 98 04/28/2017 0456   BUN 18 12/09/2018 1549   CREATININE 0.65 12/09/2018 1549   CALCIUM 9.8 12/09/2018 1549   PROT 7.2 12/09/2018 1549   ALBUMIN 4.4 12/09/2018 1549   AST 18 12/09/2018 1549   ALT 10 12/09/2018 1549   ALKPHOS 71 12/09/2018 1549   BILITOT 0.6 12/09/2018 1549   GFRNONAA 84 12/09/2018 1549   GFRAA 97 12/09/2018 1549   No results found for: CHOL, HDL, LDLCALC, LDLDIRECT, TRIG, CHOLHDL No results found for:  HGBA1C No results found for: VITAMINB12 No results found for: TSH  ASSESSMENT AND PLAN 84 y.o. year old female  has a past medical history of BCC (basal cell carcinoma) (02/09/1988), BCC (basal cell carcinoma) (10/04/1962), BCC (basal cell carcinoma) (03/06/1989), BCC (basal cell carcinoma) (04/02/1990), BCC (basal cell carcinoma) (04/01/1991), BCC (basal cell carcinoma) (02/01/1994), BCC (basal cell carcinoma) (03/26/1995), BCC (basal cell carcinoma) (12/30/1997), BCC (basal cell carcinoma) (08/06/1999), BCC (basal cell carcinoma) (08/24/1998), BCC (basal cell carcinoma) (08/05/2001), BCC (basal cell carcinoma) (08/02/2002), BCC (basal cell carcinoma) (09/12/2003), BCC (basal cell carcinoma) (06/03/2005), BCC (basal cell carcinoma) (06/05/2006), BCC (basal cell carcinoma) (08/08/2010), BCC (basal cell carcinoma) (07/01/2011), BCC (basal cell carcinoma) (12/14/2013), BCC (basal cell carcinoma) (02/28/2016), HLD (hyperlipidemia), SCC (squamous cell carcinoma) (08/11/2000), SCC (squamous cell carcinoma) (09/12/2003), SCC (squamous cell carcinoma) (06/03/2005), SCC (squamous cell carcinoma) (06/05/2006), SCC (squamous cell carcinoma) (08/08/2010), SCC (squamous cell carcinoma) (02/06/2012), SCC (squamous cell carcinoma) (11/09/2012), SCC (squamous cell carcinoma) (08/04/2014), SCC (squamous cell carcinoma) (11/30/2014), SCC (squamous cell carcinoma) (05/17/2015), and SCC (squamous cell carcinoma) (09/10/2017). here with:  1. 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  She has increased her dosing to Lyrica 100 mg twice a day, and Lamictal 50 mg twice a day.  We have been cautious about increasing the dose of these medications, especially the Lamictal, as it was felt to be causing tremor.  However, she does not feel tremor is an issue, mild on exam.  We will continue current doses of Lamictal and  Lyrica, while she adjusts to Cymbalta.  I have sent myself a reminder to call her in 4 weeks, if she is doing well, we may consider dose reduction.  Her primary doctor has increased her Xanax, which has been beneficial.  Currently, her pain is fairly well controlled.  She will follow-up in 4 months or sooner if needed.  I spent 30 minutes of face-to-face and non-face-to-face time with patient.  This included previsit chart review, lab review, study review, order entry, electronic health record documentation, patient education.  [name], AGNP-C, DNP 05/24/2020, 2:43 PM [facility] [address] [place], [place] [address] [phone]

## 2020-06-20 ENCOUNTER — Telehealth: Payer: Self-pay | Admitting: Neurology

## 2020-06-20 MED ORDER — LAMOTRIGINE 25 MG PO TABS: ORAL_TABLET | ORAL | 5 refills | Status: DC

## 2020-06-20 NOTE — Telephone Encounter (Signed)
-----   Message from Suzzanne Cloud, NP sent at 05/24/2020  2:34 PM EST ----- Call to check on Cymbalta, Lyrica and Lamictal

## 2020-06-20 NOTE — Telephone Encounter (Signed)
I called the patient. Having more tremor, cut back Lamictal to 25 mg BID (currently on 25/50), SUNCT pain is better with Cymbalta, let me know if 1 week how doing on lower dose Lamictal. Could increase Cymbalta 60 mg daily if needed. Keep Lyrica same 50/100 mg daily for now.

## 2020-06-26 NOTE — Telephone Encounter (Signed)
Pt is asking if she should take her pregabalin (LYRICA) 50 MG capsule as she should take her  lamoTRIgine (LAMICTAL) 100 MG tablet, please call pt to discuss.  She stated she did not recall what exactly she was told when she last spoke with [facility]

## 2020-06-26 NOTE — Telephone Encounter (Signed)
She is doing well on the regimen that she is on right now.  We clarified that lamictal 25mg  po bid, lyrica 50mg  po am 100mg  pm and cymbalta 30mg  po qhs.  Option to increase cymbalta if needed.  She will stay where she is right now.

## 2020-08-21 ENCOUNTER — Other Ambulatory Visit: Payer: Self-pay

## 2020-08-21 ENCOUNTER — Ambulatory Visit: Payer: Medicare PPO | Admitting: Dermatology

## 2020-08-21 ENCOUNTER — Encounter: Payer: Self-pay | Admitting: Dermatology

## 2020-08-21 DIAGNOSIS — D045 Carcinoma in situ of skin of trunk: Secondary | ICD-10-CM | POA: Diagnosis not present

## 2020-08-21 DIAGNOSIS — Z1283 Encounter for screening for malignant neoplasm of skin: Secondary | ICD-10-CM | POA: Diagnosis not present

## 2020-08-21 DIAGNOSIS — L821 Other seborrheic keratosis: Secondary | ICD-10-CM

## 2020-08-21 DIAGNOSIS — D0461 Carcinoma in situ of skin of right upper limb, including shoulder: Secondary | ICD-10-CM | POA: Diagnosis not present

## 2020-08-21 DIAGNOSIS — C44629 Squamous cell carcinoma of skin of left upper limb, including shoulder: Secondary | ICD-10-CM

## 2020-08-21 DIAGNOSIS — D485 Neoplasm of uncertain behavior of skin: Secondary | ICD-10-CM

## 2020-08-21 NOTE — Patient Instructions (Signed)

## 2020-08-30 ENCOUNTER — Telehealth: Payer: Self-pay

## 2020-08-30 ENCOUNTER — Other Ambulatory Visit: Payer: Self-pay | Admitting: Neurology

## 2020-08-30 NOTE — Telephone Encounter (Signed)
-----   Message from Lavonna Monarch, MD sent at 08/30/2020  5:22 AM EDT ----- I will need two 30-minute visits for these four skin cancers.

## 2020-08-30 NOTE — Telephone Encounter (Signed)
Path to patient and she is aware x2 30 minute appointments needed.

## 2020-08-30 NOTE — Telephone Encounter (Signed)
Has appt 09-25-2020.

## 2020-09-01 ENCOUNTER — Telehealth: Payer: Self-pay | Admitting: Neurology

## 2020-09-01 ENCOUNTER — Encounter: Payer: Self-pay | Admitting: Dermatology

## 2020-09-01 NOTE — Progress Notes (Signed)
Follow-Up Visit   Subjective  Shelby Hill is a 85 y.o. female who presents for the following: Annual Exam (Isk on face she wants more biopsys).  Multiple new growths Location:  Duration:  Quality:  Associated Signs/Symptoms: Modifying Factors:  Severity:  Timing: Context:   Objective  Well appearing patient in no apparent distress; mood and affect are within normal limits. Objective  Head - Anterior (Face): History of multiple nonmelanoma skin cancers; no sign recurrence.  No atypical pigmented lesions.  Waist up skin examination today  Objective  Left Upper Back, Right Upper Back: 4 to 6 mm flattopped brown textured papules  Objective  Left Proximal 3rd Finger: 7 mm heaped up crust       Objective  Left Upper Chest: 7Millimeter thick crust       Objective  Left Upper Breast: 10 mm pink crust       Objective  Right Side Upper Arm: 4 mm waxy crust         All skin waist up examined.  Areas beneath the undergarment not fully examined   Assessment & Plan    Skin exam for malignant neoplasm Head - Anterior (Face)  Annual skin examination.  Seborrheic keratosis (2) Left Upper Back; Right Upper Back  Leave if stable  Neoplasm of uncertain behavior of skin (4) Left Proximal 3rd Finger  Skin / nail biopsy Type of biopsy: tangential   Informed consent: discussed and consent obtained   Timeout: patient name, date of birth, surgical site, and procedure verified   Procedure prep:  Patient was prepped and draped in usual sterile fashion (Non sterile) Prep type:  Chlorhexidine Anesthesia: the lesion was anesthetized in a standard fashion   Anesthetic:  1% lidocaine w/ epinephrine 1-100,000 local infiltration Instrument used: flexible razor blade   Hemostasis achieved with: ferric subsulfate   Outcome: patient tolerated procedure well   Post-procedure details: sterile dressing applied and wound care instructions given   Dressing  type: bandage and petrolatum    Specimen 1 - Surgical pathology Differential Diagnosis: R/O BCC vs SCC Two specimens one bottle Check Margins: No  Left Upper Chest  Skin / nail biopsy Type of biopsy: tangential   Informed consent: discussed and consent obtained   Timeout: patient name, date of birth, surgical site, and procedure verified   Procedure prep:  Patient was prepped and draped in usual sterile fashion (Non sterile) Prep type:  Chlorhexidine Anesthesia: the lesion was anesthetized in a standard fashion   Anesthetic:  1% lidocaine w/ epinephrine 1-100,000 local infiltration Instrument used: flexible razor blade   Hemostasis achieved with: ferric subsulfate   Outcome: patient tolerated procedure well   Post-procedure details: sterile dressing applied and wound care instructions given   Dressing type: bandage and petrolatum    Specimen 2 - Surgical pathology Differential Diagnosis: R/O BCC vs SCC  Check Margins: No  Left Upper Breast  Skin / nail biopsy Type of biopsy: tangential   Informed consent: discussed and consent obtained   Timeout: patient name, date of birth, surgical site, and procedure verified   Procedure prep:  Patient was prepped and draped in usual sterile fashion (Non sterile) Prep type:  Chlorhexidine Anesthesia: the lesion was anesthetized in a standard fashion   Anesthetic:  1% lidocaine w/ epinephrine 1-100,000 local infiltration Instrument used: flexible razor blade   Hemostasis achieved with: ferric subsulfate   Outcome: patient tolerated procedure well   Post-procedure details: sterile dressing applied and wound care instructions given  Dressing type: bandage and petrolatum    Specimen 3 - Surgical pathology Differential Diagnosis: R/O BCC vs SCC  Check Margins: No  Right Side Upper Arm  Skin / nail biopsy Type of biopsy: tangential   Informed consent: discussed and consent obtained   Timeout: patient name, date of birth, surgical  site, and procedure verified   Procedure prep:  Patient was prepped and draped in usual sterile fashion (Non sterile) Prep type:  Chlorhexidine Anesthesia: the lesion was anesthetized in a standard fashion   Anesthetic:  1% lidocaine w/ epinephrine 1-100,000 local infiltration Instrument used: flexible razor blade   Hemostasis achieved with: ferric subsulfate   Outcome: patient tolerated procedure well   Post-procedure details: sterile dressing applied and wound care instructions given   Dressing type: bandage and petrolatum    Specimen 4 - Surgical pathology Differential Diagnosis: R/O BCC vs SCC  Check Margins: No      I, Lavonna Monarch, MD, have reviewed all documentation for this visit.  The documentation on 09/01/20 for the exam, diagnosis, procedures, and orders are all accurate and complete.

## 2020-09-01 NOTE — Telephone Encounter (Signed)
Pt called, want to know which medication I am on could cause tremors in my hands. Would like a call from the nurse.

## 2020-09-04 MED ORDER — PREGABALIN 50 MG PO CAPS: ORAL_CAPSULE | ORAL | 0 refills | Status: DC

## 2020-09-04 NOTE — Telephone Encounter (Signed)
Has been difficult to determine if tremor is from Lamictal or Lyrica. Let's do 1 change at a time, cut back Lyrica to 50 mg twice daily, see if that helps the tremor, if not, we'll decrease the Lamictal. Keep appointment in May.

## 2020-09-04 NOTE — Telephone Encounter (Signed)
I called pt back and relayed the plan.  She will keep notes to see how she does.  Will decrease the lyrica to 50mg  po bid (from 50mg  am 100mg  pm) and give it 2 wks to see how she does.  Hard to say lamictal or lyrica causing tremor.  Will decrease one then the other pending how she is doing.  She appreciated call back.

## 2020-09-04 NOTE — Addendum Note (Signed)
Addended by: Brandon Melnick on: 09/04/2020 01:51 PM   Modules accepted: Orders

## 2020-09-04 NOTE — Telephone Encounter (Signed)
Called pt and she states since last seenn, duloxetine has helped no epsides/flare.  Has noted after 2 months an issues with writing R hand (tremors).  Not aware on L hand. Only when writing with R hand.  (more so when stressed).  ? Medications?  Taking pregabalin 50mg  am 100mg  pm, lamotrigine 25mg  po bid, duloxetine 30mg  pm.  Also ? Decreasing one medication (pregabalin). Has appt in May 2022.

## 2020-09-04 NOTE — Telephone Encounter (Signed)
Called pt and LMVM for her with husband that was calling her back.  He will let her know to call me back.

## 2020-09-21 DIAGNOSIS — M19072 Primary osteoarthritis, left ankle and foot: Secondary | ICD-10-CM | POA: Diagnosis not present

## 2020-09-25 ENCOUNTER — Ambulatory Visit: Payer: Medicare PPO | Admitting: Neurology

## 2020-09-25 ENCOUNTER — Encounter: Payer: Self-pay | Admitting: Neurology

## 2020-09-25 VITALS — BP 140/73 | HR 84 | Ht 66.0 in | Wt 178.0 lb

## 2020-09-25 DIAGNOSIS — G500 Trigeminal neuralgia: Secondary | ICD-10-CM

## 2020-09-25 MED ORDER — DULOXETINE HCL 30 MG PO CPEP: 30.0000 mg | ORAL_CAPSULE | Freq: Every day | ORAL | 5 refills | Status: DC

## 2020-09-25 MED ORDER — LAMOTRIGINE 25 MG PO TABS: ORAL_TABLET | ORAL | 5 refills | Status: DC

## 2020-09-25 MED ORDER — PREGABALIN 25 MG PO CAPS: ORAL_CAPSULE | ORAL | 0 refills | Status: DC

## 2020-09-25 NOTE — Progress Notes (Signed)
PATIENT: Shelby Hill DOB: 12-04-1935  REASON FOR VISIT: follow up HISTORY FROM: patient  HISTORY OF PRESENT ILLNESS: Today 09/25/20 Shelby Hill is an 85 year old female with history of left V1 distribution sharp pains, felt to be some kind of SUNCT headache. Sometimes in the left nostril.  Is on Lamictal, Lyrica, and Cymbalta.  Has had side effect of tremor, unclear if related to the Lamictal, or the Lyrica.  In the last month, we have decreased Lyrica down to 50 mg twice daily.  Has noted an improvement in the tremor.  With the addition of Cymbalta, also the Xanax from PCP, she has had no further episodes of pain.  Remains on Lamictal 25 mg twice daily.  Her anxiety is better.  She remains active, drives. Doesn't notice the tremor unless writing, she doesn't think is an issue.  Here today for evaluation unaccompanied.  Update 05/24/2020 SS: Shelby Hill is an 85 year old female with history of left V1 distribution sharp pains, is felt to possibly have a SUNCT headache.  Has had side effect of tremor, which is felt to be related to Lamictal.  Back in June, she was able to cut her doses of Lamictal (25/25) and Lyrica (50/50) back.  However of recent, she has had more pain we had increased. Currently on Lamictal 25/50 (11/29), Lyrica 50/100 (11/17), but she increased to Lamictal 50 mg twice a day, Lyrica 100 mg twice a day.  A few weeks ago called her PCP, added Xanax.  Feels the pain is related to her anxiety, was worried with the holidays.  The Xanax has been quite helpful, pain is better controlled, but still has the sharp twinges of pain to the left to be one area.  Does not notice tremor with higher doses of medications. Didn't take prednisone. Anxiety is an issue for her. Here today for follow-up unaccompanied.   HISTORY  11/24/2019 SS: Shelby Hill is an 85 year old female with history of left V1 distribution sharp pains. She is felt to possibly have a SUNCT headache was treated with medication. She  had dramatic response, had total elimitation of the head pain. She did have side effect of tremor on Lamictal. She remains on Lamictal has cut her dose to 25 mg twice daily. Her dose of Lyrica has been reduced, is taking 50 mg twice a day.  She is fearful to cut the dose of either medications any further.  At times if she sneezes or yawns, she may have a twinge of pain to her right upper forehead, will then radiate to the left V1 area around her eye.  Is mild at this point, not persistent.  Tremor is much improved as Lamictal has been reduced.  So much improved, she does not even notice it, only present when nervous or anxious.  Has some tremor today, she drove from [place], had a lot of traffic, was late.  Presents today unaccompanied.  REVIEW OF SYSTEMS: Out of a complete 14 system review of symptoms, the patient complains only of the following symptoms, and all other reviewed systems are negative.  Facial pain  ALLERGIES: Allergies  Allergen Reactions  . Codeine Nausea And Vomiting    HOME MEDICATIONS: Outpatient Medications Prior to Visit  Medication Sig Dispense Refill  . ALPRAZolam (XANAX) 0.5 MG tablet Take 0.5 tablets (0.25 mg total) by mouth at bedtime as needed for anxiety or sleep. 3 tablet 0  . aspirin EC 81 MG tablet Take 81 mg by mouth daily. Swallow whole.    Marland Kitchen  calcitonin, salmon, (MIACALCIN/FORTICAL) 200 UNIT/ACT nasal spray USE ONE SPRAY IN ONE NOSTRIL ONCE DAILY, ALTERNATING NOSTRILS    . rosuvastatin (CRESTOR) 5 MG tablet Take 5 mg by mouth at bedtime.    . DULoxetine (CYMBALTA) 30 MG capsule Take 1 capsule (30 mg total) by mouth daily. (Patient taking differently: Take 30 mg by mouth at bedtime.) 30 capsule 5  . lamoTRIgine (LAMICTAL) 25 MG tablet Take 25 mg twice daily 60 tablet 5  . pregabalin (LYRICA) 50 MG capsule TAKE ONE CAPSULE BY MOUTH EVERY MORNING and EVERY EVENING (new dose) 270 capsule 0   No facility-administered medications prior to visit.    PAST MEDICAL  HISTORY: Past Medical History:  Diagnosis Date  . BCC (basal cell carcinoma) 02/09/1988   left upper thigh,left lower thigh tx cx3 26fu  . BCC (basal cell carcinoma) 10/04/1962   left clavicle,left breast, Back  . BCC (basal cell carcinoma) 03/06/1989   mid back  . BCC (basal cell carcinoma) 04/02/1990   left upper shoulder (CX35FU),left upper breast(CX35FU),above left clavicle, right outer back,  . BCC (basal cell carcinoma) 04/01/1991   right cetner outer brown (CX3+exc. ), upper left chest,upper left chest medial, right back  . BCC (basal cell carcinoma) 02/01/1994   right back  . BCC (basal cell carcinoma) 03/26/1995   central upper back (CX35FU),left upper back (CX35FU)  . BCC (basal cell carcinoma) 12/30/1997   right cheek (exc. )  . BCC (basal cell carcinoma) 08/06/1999   upper right shin (CX35FU), upper right shin (CX35FU)  . BCC (basal cell carcinoma) 08/24/1998   left breast (CX35FU)  . BCC (basal cell carcinoma) 08/05/2001   right inner cheek (MOHS), Left upper back (CX35FU),right upper back (CX35FU),, right top hand (CX35FU)  . BCC (basal cell carcinoma) 08/02/2002   upper back(CX35FU)  . BCC (basal cell carcinoma) 09/12/2003   right mid back (CX35FU), right thigh (deep freeze+aldara) Left thigh outer (deep freeze +aldara)  . BCC (basal cell carcinoma) 06/03/2005   rigth scapula (CX35FU),   . BCC (basal cell carcinoma) 06/05/2006   right ant. lateral thigh (CX35FU)  . BCC (basal cell carcinoma) 08/08/2010   right forearm (CX35FU)  . BCC (basal cell carcinoma) 07/01/2011   right shin (MOHS)  . BCC (basal cell carcinoma) 12/14/2013   left upper thigh(CX35FU), left upper arm (CX35FU), mid right back (CX35FU), lower right back (CX35FU)  . BCC (basal cell carcinoma) 02/28/2016   left forearm  . HLD (hyperlipidemia)   . 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 SCC (squamous cell carcinoma) 09/12/2003   upper back (CX35FU)  . SCC (squamous cell  carcinoma) 06/03/2005   left v of neck (CX35FU), Left shin sup. (CX35FU)  . SCC (squamous cell carcinoma) 06/05/2006   mid chest (CX35FU)  . SCC (squamous cell carcinoma) 08/08/2010   left temple (CX35FU), Left upper arm (CX35FU), right forearm/wrist (CX35FU)  . SCC (squamous cell carcinoma) 02/06/2012   Left post neck-tx p bx  . SCC (squamous cell carcinoma) 11/09/2012   left hand-tx p bx-, left chest sup -tx p bx, left chest inf. (CX35FU)  . SCC (squamous cell carcinoma) 08/04/2014   left clavicle-tx p bx  . SCC (squamous cell carcinoma) 11/30/2014   lower right leg distal (CX35FU), lower right leg, prox. (CX35FU)  . SCC (squamous cell carcinoma) 05/17/2015   right jawline (CX35FU), Left sideburn (CX35FU)  . SCC (squamous cell carcinoma) 09/10/2017   left forearm-tx p bx, left temple -tx p bx  PAST SURGICAL HISTORY: Past Surgical History:  Procedure Laterality Date  . COLONOSCOPY  01/01/2012   Procedure: COLONOSCOPY;  Surgeon: Rogene Houston, MD;  Location: [facility];  Service: Endoscopy;  Laterality: N/A;  830  . INTRAMEDULLARY (IM) NAIL INTERTROCHANTERIC Left 04/26/2017   Procedure: INTRAMEDULLARY (IM) NAIL INTERTROCHANTRIC;  Surgeon: Leandrew Koyanagi, MD;  Location: [facility];  Service: Orthopedics;  Laterality: Left;  . KNEE SURGERY    . ORIF TIBIA PLATEAU      FAMILY HISTORY: Family History  Problem Relation Age of Onset  . CAD Mother   . Stroke Sister   . Stroke Brother     SOCIAL HISTORY: Social History   Socioeconomic History  . Marital status: Married    Spouse name: Not on file  . Number of children: Not on file  . Years of education: Not on file  . Highest education level: Not on file  Occupational History  . Not on file  Tobacco Use  . Smoking status: Never Smoker  . Smokeless tobacco: Never Used  Vaping Use  . Vaping Use: Never used  Substance and Sexual Activity  . Alcohol use: Yes    Comment: Occasionally  . Drug use: No  . Sexual activity: Not  on file  Other Topics Concern  . Not on file  Social History Narrative   ** Merged History Encounter **    Right handed   Caffeine~ 1-2 cups per day    Social Determinants of Health   Financial Resource Strain: Not on file  Food Insecurity: Not on file  Transportation Needs: Not on file  Physical Activity: Not on file  Stress: Not on file  Social Connections: Not on file  Intimate Partner Violence: Not on file   PHYSICAL EXAM  Vitals:   09/25/20 1339  BP: 140/73  Pulse: 84  Weight: 178 lb (80.7 kg)  Height: 5\' 6"  (1.676 m)   Body mass index is 28.73 kg/m.  Generalized: Well developed, in no acute distress   Neurological examination  Mentation: Alert oriented to time, place, history taking. Follows all commands speech and language fluent Cranial nerve II-XII: Pupils were equal round reactive to light. Extraocular movements were full, visual field were full on confrontational test. Facial sensation and strength were normal. Head turning and shoulder shrug  were normal and symmetric. Motor: The motor testing reveals 5 over 5 strength of all 4 extremities. Good symmetric motor tone is noted throughout.  Sensory: Sensory testing is intact to soft touch on all 4 extremities. No evidence of extinction is noted.  Coordination: Cerebellar testing reveals good finger-nose-finger and heel-to-shin bilaterally. Mild tremor with finger nose finger bilaterally.  Very tremor to handwriting sample, none with spiral draw. Gait and station: Gait is normal.  Reflexes: Deep tendon reflexes are symmetric and normal bilaterally.   DIAGNOSTIC DATA (LABS, IMAGING, TESTING) - I reviewed patient records, labs, notes, testing and imaging myself where available.  Lab Results  Component Value Date   WBC 6.3 12/09/2018   HGB 13.9 12/09/2018   HCT 41.9 12/09/2018   MCV 92 12/09/2018   PLT 194 12/09/2018      Component Value Date/Time   NA 144 12/09/2018 1549   K 4.0 12/09/2018 1549   CL 104  12/09/2018 1549   CO2 24 12/09/2018 1549   GLUCOSE 97 12/09/2018 1549   GLUCOSE 98 04/28/2017 0456   BUN 18 12/09/2018 1549   CREATININE 0.65 12/09/2018 1549   CALCIUM 9.8 12/09/2018 1549  PROT 7.2 12/09/2018 1549   ALBUMIN 4.4 12/09/2018 1549   AST 18 12/09/2018 1549   ALT 10 12/09/2018 1549   ALKPHOS 71 12/09/2018 1549   BILITOT 0.6 12/09/2018 1549   GFRNONAA 84 12/09/2018 1549   GFRAA 97 12/09/2018 1549   No results found for: CHOL, HDL, LDLCALC, LDLDIRECT, TRIG, CHOLHDL No results found for: HGBA1C No results found for: VITAMINB12 No results found for: TSH  ASSESSMENT AND PLAN 85 y.o. year old female  has a past medical history of BCC (basal cell carcinoma) (02/09/1988), BCC (basal cell carcinoma) (10/04/1962), BCC (basal cell carcinoma) (03/06/1989), BCC (basal cell carcinoma) (04/02/1990), BCC (basal cell carcinoma) (04/01/1991), BCC (basal cell carcinoma) (02/01/1994), BCC (basal cell carcinoma) (03/26/1995), BCC (basal cell carcinoma) (12/30/1997), BCC (basal cell carcinoma) (08/06/1999), BCC (basal cell carcinoma) (08/24/1998), BCC (basal cell carcinoma) (08/05/2001), BCC (basal cell carcinoma) (08/02/2002), BCC (basal cell carcinoma) (09/12/2003), BCC (basal cell carcinoma) (06/03/2005), BCC (basal cell carcinoma) (06/05/2006), BCC (basal cell carcinoma) (08/08/2010), BCC (basal cell carcinoma) (07/01/2011), BCC (basal cell carcinoma) (12/14/2013), BCC (basal cell carcinoma) (02/28/2016), HLD (hyperlipidemia), SCC (squamous cell carcinoma) (08/11/2000), SCC (squamous cell carcinoma) (09/12/2003), SCC (squamous cell carcinoma) (06/03/2005), SCC (squamous cell carcinoma) (06/05/2006), SCC (squamous cell carcinoma) (08/08/2010), SCC (squamous cell carcinoma) (02/06/2012), SCC (squamous cell carcinoma) (11/09/2012), SCC (squamous cell carcinoma) (08/04/2014), SCC (squamous cell carcinoma) (11/30/2014), SCC (squamous cell carcinoma) (05/17/2015), and SCC (squamous cell carcinoma)  (09/10/2017). here with:  1. 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, left V1  -No further pain episodes since December 2021 -Continue Cymbalta 30 mg daily, seems beneficial, along with Xanax from PCP, certainly helping anxiety -Will continue to cut back Lyrica, reduce to 25 mg AM/50 mg PM for 2 weeks, then 25 mg twice daily; new prescription for 25 mg capsule was sent  -Continue Lamictal 25 mg twice daily -Continue to monitor side effect of tremor, unclear if coming from Lyrica or Lamictal, has noted improvement with recent reduction in Lyrica -If she continues to remain under excellent control with her pain, will consider reduction of Lamictal (1/2 tablet twice daily) -Follow-up in 6 months or sooner if needed  [name], Laqueta Jean, DNP 09/25/2020, 2:16 PM [facility] [address], [place], [place] [address] [phone]

## 2020-09-25 NOTE — Patient Instructions (Signed)
Decrease Lyrica to 25 mg in the morning, take 50 mg at night for 2 weeks, then take 25 mg twice daily Keep the Lamictal at current dosing Continue Cymbalta Let me know how the tremor does See you back in 6 months

## 2020-09-26 NOTE — Progress Notes (Signed)
I have read the note, and I agree with the clinical assessment and plan.  Pricilla Moehle K Greyson Riccardi   

## 2020-10-05 ENCOUNTER — Other Ambulatory Visit: Payer: Self-pay

## 2020-10-05 ENCOUNTER — Encounter: Payer: Self-pay | Admitting: Dermatology

## 2020-10-05 ENCOUNTER — Telehealth: Payer: Self-pay | Admitting: Neurology

## 2020-10-05 ENCOUNTER — Ambulatory Visit (INDEPENDENT_AMBULATORY_CARE_PROVIDER_SITE_OTHER): Payer: Medicare PPO | Admitting: Dermatology

## 2020-10-05 DIAGNOSIS — D045 Carcinoma in situ of skin of trunk: Secondary | ICD-10-CM | POA: Diagnosis not present

## 2020-10-05 DIAGNOSIS — D0461 Carcinoma in situ of skin of right upper limb, including shoulder: Secondary | ICD-10-CM | POA: Diagnosis not present

## 2020-10-05 DIAGNOSIS — D049 Carcinoma in situ of skin, unspecified: Secondary | ICD-10-CM

## 2020-10-05 NOTE — Telephone Encounter (Signed)
Sounds like a good plan, thanks Shelby Hill.

## 2020-10-05 NOTE — Telephone Encounter (Signed)
Spoke to pt.  She is taking pregabalin 25 mg in the morning 50 mg at night its been about 10 days since that change was made. She feels that sensation possily flare for the last 2 days. (noted on L nostril and edge of L eye).   I relayed that she could go back to taking pregabalin 50 mg a.m. 50 mg p.m. what she was on before and see how she does she will let us know next mid week if there is any change to this I will give her a call back after letting SS/NP know and will call back if she recommends something else.

## 2020-10-05 NOTE — Telephone Encounter (Signed)
LMVM for pt that returned call.  °

## 2020-10-05 NOTE — Patient Instructions (Signed)

## 2020-10-05 NOTE — Telephone Encounter (Signed)
Pt called requesting that the RN call her back to discuss her lamoTRIgine (LAMICTAL) 25 MG tablet and her lamoTRIgine (LAMICTAL) 25 MG tablet Please advise.

## 2020-10-05 NOTE — Telephone Encounter (Signed)
Noted  

## 2020-10-12 MED ORDER — DULOXETINE HCL 60 MG PO CPEP: 60.0000 mg | ORAL_CAPSULE | Freq: Every day | ORAL | 5 refills | Status: DC

## 2020-10-12 NOTE — Addendum Note (Signed)
Addended by: Suzzanne Cloud on: 10/12/2020 04:19 PM   Modules accepted: Orders

## 2020-10-12 NOTE — Telephone Encounter (Signed)
Pt would like a call to discuss her feeling that the medication is still off.  She'd like to discuss a change.

## 2020-10-12 NOTE — Telephone Encounter (Signed)
I called the patient, claims blinks left eye sometimes gets sharp pain to nostril, is fraction of a second, doesn't stay. Nothing on left temple that she was having on left temple. She is taking 0.5 mg 1/2 tablet at bedtime PRN for anxiety, Cymbalta 30 mg daily, Lyrica 25 mg AM/50 mg PM, Lamictal 25 mg twice daily. She thinks the tremor is a lot better. We are going to increase the Cymbalta to 60 mg daily to see if this does better, if no change will increase Lamictal.

## 2020-10-14 ENCOUNTER — Other Ambulatory Visit: Payer: Self-pay | Admitting: Neurology

## 2020-10-18 NOTE — Telephone Encounter (Signed)
This pt on 25mg  am and 50mg  PM of pregabalin.  [place] drug registry checked last fill 10-16-20 #30 25mg  with pcp.  Last fill 09-25-20 #30 25mg  w/SS, and 09-19-20 #90 50mg .

## 2020-10-19 ENCOUNTER — Encounter: Payer: Medicare PPO | Admitting: Dermatology

## 2020-10-19 DIAGNOSIS — D692 Other nonthrombocytopenic purpura: Secondary | ICD-10-CM | POA: Diagnosis not present

## 2020-10-19 DIAGNOSIS — U071 COVID-19: Secondary | ICD-10-CM | POA: Diagnosis not present

## 2020-10-19 DIAGNOSIS — Z299 Encounter for prophylactic measures, unspecified: Secondary | ICD-10-CM | POA: Diagnosis not present

## 2020-10-19 DIAGNOSIS — I10 Essential (primary) hypertension: Secondary | ICD-10-CM | POA: Diagnosis not present

## 2020-10-21 ENCOUNTER — Encounter: Payer: Self-pay | Admitting: Dermatology

## 2020-10-21 NOTE — Progress Notes (Signed)
   Follow-Up Visit   Subjective  Shelby Hill is a 85 y.o. female who presents for the following: Procedure (SCC x 1 and CIS x 3).  Biopsy-proven skin cancers Location:  Duration:  Quality:  Associated Signs/Symptoms: Modifying Factors:  Severity:  Timing: Context: Treatment  Objective  Well appearing patient in no apparent distress; mood and affect are within normal limits. Objective  Left upper breast: Lesion identified in room  Objective  Left upper chest: Lesion identified in room.    All skin waist up examined.   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  Destruction of lesion Complexity: simple   Destruction method: electrodesiccation and curettage   Informed consent: discussed and consent obtained   Timeout:  patient name, date of birth, surgical site, and procedure verified Anesthesia: the lesion was anesthetized in a standard fashion   Anesthetic:  1% lidocaine w/ epinephrine 1-100,000 local infiltration Curettage performed in three different directions: Yes   Curettage cycles:  3 Lesion length (cm):  2 Lesion width (cm):  2 Margin per side (cm):  0 Final wound size (cm):  2 Hemostasis achieved with:  ferric subsulfate Outcome: patient tolerated procedure well with no complications   Additional details:  Wound innoculated with 5 fluorouracil solution.  Left upper chest  Destruction of lesion Complexity: simple   Destruction method: electrodesiccation and curettage   Informed consent: discussed and consent obtained   Timeout:  patient name, date of birth, surgical site, and procedure verified Anesthesia: the lesion was anesthetized in a standard fashion   Anesthetic:  1% lidocaine w/ epinephrine 1-100,000 local infiltration Curettage performed in three different directions: Yes   Curettage cycles:  3 Lesion length (cm):  2 Lesion width (cm):  2 Margin per side (cm):  0 Final wound size (cm):   2 Hemostasis achieved with:  ferric subsulfate Outcome: patient tolerated procedure well with no complications   Additional details:  Wound innoculated with 5 fluorouracil solution.  Right side upper arm  Destruction of lesion Complexity: simple   Destruction method: electrodesiccation and curettage   Informed consent: discussed and consent obtained   Timeout:  patient name, date of birth, surgical site, and procedure verified Anesthesia: the lesion was anesthetized in a standard fashion   Anesthetic:  1% lidocaine w/ epinephrine 1-100,000 local infiltration Curettage performed in three different directions: Yes   Curettage cycles:  3 Lesion length (cm):  1.5 Lesion width (cm):  1.5 Margin per side (cm):  0 Final wound size (cm):  1.5 Hemostasis achieved with:  ferric subsulfate Outcome: patient tolerated procedure well with no complications   Additional details:  Wound innoculated with 5 fluorouracil solution.      I, Lavonna Monarch, MD, have reviewed all documentation for this visit.  The documentation on 10/21/20 for the exam, diagnosis, procedures, and orders are all accurate and complete.

## 2020-10-25 ENCOUNTER — Telehealth: Payer: Self-pay | Admitting: Neurology

## 2020-10-25 NOTE — Telephone Encounter (Signed)
I called patient.  She has had COVID she was placed on a medication called Molnupiravira  8 capsules split twice a day for 5 days.  She has noticed an increase worsening  trigeminal neuralgia pain on her left side of face when she bites, when says certain letters "B",   chewing and both lips.  She is taking Lyrica 25 mg twice daily. Lamotrigine 25mg  po bid, and duloetine 60mg  .  She is asking if there is something that would help, going back to her increased dosing. I relayed to patient she could go back up on her Lyrica taking 25 mg in the morning and 50 mg in the evening.  Would  there be something else that may help her further.  Please advise.

## 2020-10-25 NOTE — Telephone Encounter (Signed)
We just increased the Cymbalta to 60 mg daily back on 10/12/20. If she hasn't made this change, I would do this first. Keep Lyrica 25/50, can increase to Lamictal 25/50 if needed. May give it some time before making any changes. Needs to be mindful of tremor, trying to determine if Lamictal or Lyrica is what is worsening.

## 2020-10-25 NOTE — Telephone Encounter (Signed)
Pt states since her diagnosis of Covid-19 last week she doesn't know how this is affecting her medications.  Pt  is asking for a call to discuss how since Covid her facial pain is worsening, please call.

## 2020-10-25 NOTE — Telephone Encounter (Signed)
Called mobile.  No answer did not LM.

## 2020-10-25 NOTE — Telephone Encounter (Signed)
Patient called back I relayed Sarah's message.  She is on duloxetine 60 mg a day.  I relayed that it seems to pt she is getting a little bit better as day went on.   She could hold off on changing anything at this time but if things get worse she can always go to the Lyrica 25 mg a.m. and 50 mg p.m.  It sounds like she may do that.   I relayed that if she has continued problems and pain gets worse per Judson Roch she could increase Lamictal to 25 mg a.m. and 50 mg p.m.  [place]  She was a little concerned but I told her she could call us if she had a problem when she was down.there.   She verbalized understanding and instructions.  She will call back as needed.

## 2020-11-10 ENCOUNTER — Other Ambulatory Visit: Payer: Self-pay | Admitting: Neurology

## 2020-11-10 NOTE — Telephone Encounter (Signed)
Pt called needing a refill on her pregabalin (LYRICA) 25 MG capsule and having it sent to the [facility]

## 2020-11-13 MED ORDER — PREGABALIN 25 MG PO CAPS: 25.0000 mg | ORAL_CAPSULE | Freq: Two times a day (BID) | ORAL | 0 refills | Status: DC

## 2020-11-13 NOTE — Telephone Encounter (Signed)
Lyrica refill sent in

## 2020-11-13 NOTE — Telephone Encounter (Addendum)
Per last note, the plan was to reduce her pregabalin rx to 25mg , one cap BID. I have verbally confirmed this w/ Judson Roch.   [place] narcotic registry checked. She received #30 from [facility] on 09/25/20 and #30 from Tamela Gammon, NP (primary care provider) on 10/16/20.  I called the patient and instructed her that only one provider is able to prescribe this controlled substance. She would like to continue her refills here at our office.   Refill for pregabalin 25mg , one cap BID (#60 x 0) sent to Sarah for approval. The patient will call us if the reduced dose does not provide relief of symptoms. If the lower dose works well, then additional refills can be added to the next prescription.

## 2020-11-13 NOTE — Addendum Note (Signed)
Addended by: Noberto Retort C on: 11/13/2020 10:18 AM   Modules accepted: Orders

## 2020-12-06 ENCOUNTER — Telehealth: Payer: Self-pay | Admitting: Neurology

## 2020-12-06 NOTE — Telephone Encounter (Signed)
Returned patient's call.  Patient stated she discussed taking Xanax with Judson Roch, NP.  Is asking about her medications, what she can quit taking and what she needs to continue to taking.  She is concerned about taking the xanax.  We talked in detail about PRN's and taking as needed.  Tamela Gammon, DNP is the prescriber of the Xanax, recommended to call her in regards to refills and further advice.    Patient is scheduled with Sarah @ 04/24/21 at 1415 with arrival time of 1345  Patient denied further questions, verbalized understanding and expressed appreciation for the phone call.

## 2020-12-06 NOTE — Telephone Encounter (Signed)
Pt called would like a call from the nurse to discuss physician taking over prescription for ALPRAZolam (XANAX) 0.5 MG tablet.

## 2020-12-07 ENCOUNTER — Other Ambulatory Visit: Payer: Self-pay

## 2020-12-07 ENCOUNTER — Ambulatory Visit (INDEPENDENT_AMBULATORY_CARE_PROVIDER_SITE_OTHER): Payer: Medicare PPO | Admitting: Dermatology

## 2020-12-07 ENCOUNTER — Encounter: Payer: Self-pay | Admitting: Dermatology

## 2020-12-07 DIAGNOSIS — D099 Carcinoma in situ, unspecified: Secondary | ICD-10-CM

## 2020-12-07 DIAGNOSIS — D0462 Carcinoma in situ of skin of left upper limb, including shoulder: Secondary | ICD-10-CM

## 2020-12-07 DIAGNOSIS — L570 Actinic keratosis: Secondary | ICD-10-CM | POA: Diagnosis not present

## 2020-12-07 NOTE — Patient Instructions (Signed)

## 2020-12-08 DIAGNOSIS — Z299 Encounter for prophylactic measures, unspecified: Secondary | ICD-10-CM | POA: Diagnosis not present

## 2020-12-08 DIAGNOSIS — D692 Other nonthrombocytopenic purpura: Secondary | ICD-10-CM | POA: Diagnosis not present

## 2020-12-08 DIAGNOSIS — F1320 Sedative, hypnotic or anxiolytic dependence, uncomplicated: Secondary | ICD-10-CM | POA: Diagnosis not present

## 2020-12-08 DIAGNOSIS — F419 Anxiety disorder, unspecified: Secondary | ICD-10-CM | POA: Diagnosis not present

## 2020-12-13 DIAGNOSIS — M25562 Pain in left knee: Secondary | ICD-10-CM | POA: Diagnosis not present

## 2020-12-13 DIAGNOSIS — G8929 Other chronic pain: Secondary | ICD-10-CM | POA: Diagnosis not present

## 2020-12-14 ENCOUNTER — Ambulatory Visit: Payer: Medicare PPO | Admitting: Orthopedic Surgery

## 2020-12-18 ENCOUNTER — Encounter: Payer: Self-pay | Admitting: Dermatology

## 2020-12-18 ENCOUNTER — Other Ambulatory Visit: Payer: Self-pay | Admitting: Neurology

## 2020-12-18 MED ORDER — PREGABALIN 25 MG PO CAPS: 25.0000 mg | ORAL_CAPSULE | Freq: Two times a day (BID) | ORAL | 5 refills | Status: DC

## 2020-12-18 NOTE — Progress Notes (Signed)
   Follow-Up Visit   Subjective  Shelby Hill is a 85 y.o. female who presents for the following: Procedure (Scc ).  CIS hand plus other crusts Location:  Duration:  Quality:  Associated Signs/Symptoms: Modifying Factors:  Severity:  Timing: Context:   Objective  Well appearing patient in no apparent distress; mood and affect are within normal limits. Left Proximal 3rd Finger Lesion identified by patient and Dr.Corbyn Steedman and nurse in room.    Chest - Medial (Center), Glabella, Left Forearm - Posterior Gritty and hornlike 3 to 4 mm pink crusts    A focused examination was performed including head, neck, upper chest, arms, hands. Relevant physical exam findings are noted in the Assessment and Plan.   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  Destruction of lesion Complexity: simple   Destruction method: electrodesiccation and curettage   Informed consent: discussed and consent obtained   Timeout:  patient name, date of birth, surgical site, and procedure verified Anesthesia: the lesion was anesthetized in a standard fashion   Anesthetic:  1% lidocaine w/ epinephrine 1-100,000 local infiltration Curettage performed in three different directions: Yes   Curettage cycles:  3 Lesion length (cm):  1 Lesion width (cm):  1 Margin per side (cm):  0 Final wound size (cm):  1 Hemostasis achieved with:  ferric subsulfate Outcome: patient tolerated procedure well with no complications   Additional details:  Wound innoculated with 5 fluorouracil solution.  AK (actinic keratosis) (3) Left Forearm - Posterior; Chest - Medial (Center); Glabella  Destruction of lesion - Chest - Medial (Center), Glabella, Left Forearm - Posterior Complexity: simple   Destruction method: cryotherapy   Informed consent: discussed and consent obtained   Timeout:  patient name, date of birth, surgical site, and procedure verified Lesion destroyed using liquid  nitrogen: Yes   Cryotherapy cycles:  3 Outcome: patient tolerated procedure well with no complications   Post-procedure details: wound care instructions given        I, Lavonna Monarch, MD, have reviewed all documentation for this visit.  The documentation on 12/18/20 for the exam, diagnosis, procedures, and orders are all accurate and complete.

## 2020-12-18 NOTE — Telephone Encounter (Signed)
Pt called, question about instructions taking pregabalin (LYRICA) 25 MG capsule. Would like a call from the nurse.

## 2020-12-18 NOTE — Telephone Encounter (Signed)
I spoke to the patient. She confirmed she is taking pregabalin '25mg'$ , one capsule BID. She is actually calling for a refill.

## 2020-12-20 DIAGNOSIS — G8929 Other chronic pain: Secondary | ICD-10-CM | POA: Diagnosis not present

## 2020-12-20 DIAGNOSIS — M6281 Muscle weakness (generalized): Secondary | ICD-10-CM | POA: Diagnosis not present

## 2020-12-20 DIAGNOSIS — M25562 Pain in left knee: Secondary | ICD-10-CM | POA: Diagnosis not present

## 2020-12-20 DIAGNOSIS — M25662 Stiffness of left knee, not elsewhere classified: Secondary | ICD-10-CM | POA: Diagnosis not present

## 2020-12-29 DIAGNOSIS — M25562 Pain in left knee: Secondary | ICD-10-CM | POA: Diagnosis not present

## 2020-12-29 DIAGNOSIS — M25662 Stiffness of left knee, not elsewhere classified: Secondary | ICD-10-CM | POA: Diagnosis not present

## 2020-12-29 DIAGNOSIS — M6281 Muscle weakness (generalized): Secondary | ICD-10-CM | POA: Diagnosis not present

## 2020-12-29 DIAGNOSIS — G8929 Other chronic pain: Secondary | ICD-10-CM | POA: Diagnosis not present

## 2021-01-02 DIAGNOSIS — M6281 Muscle weakness (generalized): Secondary | ICD-10-CM | POA: Diagnosis not present

## 2021-01-02 DIAGNOSIS — M25662 Stiffness of left knee, not elsewhere classified: Secondary | ICD-10-CM | POA: Diagnosis not present

## 2021-01-02 DIAGNOSIS — G8929 Other chronic pain: Secondary | ICD-10-CM | POA: Diagnosis not present

## 2021-01-02 DIAGNOSIS — M25562 Pain in left knee: Secondary | ICD-10-CM | POA: Diagnosis not present

## 2021-01-05 DIAGNOSIS — M25662 Stiffness of left knee, not elsewhere classified: Secondary | ICD-10-CM | POA: Diagnosis not present

## 2021-01-05 DIAGNOSIS — M6281 Muscle weakness (generalized): Secondary | ICD-10-CM | POA: Diagnosis not present

## 2021-01-05 DIAGNOSIS — M25562 Pain in left knee: Secondary | ICD-10-CM | POA: Diagnosis not present

## 2021-01-05 DIAGNOSIS — G8929 Other chronic pain: Secondary | ICD-10-CM | POA: Diagnosis not present

## 2021-01-09 DIAGNOSIS — G8929 Other chronic pain: Secondary | ICD-10-CM | POA: Diagnosis not present

## 2021-01-09 DIAGNOSIS — M25662 Stiffness of left knee, not elsewhere classified: Secondary | ICD-10-CM | POA: Diagnosis not present

## 2021-01-09 DIAGNOSIS — H04123 Dry eye syndrome of bilateral lacrimal glands: Secondary | ICD-10-CM | POA: Diagnosis not present

## 2021-01-09 DIAGNOSIS — G500 Trigeminal neuralgia: Secondary | ICD-10-CM | POA: Diagnosis not present

## 2021-01-09 DIAGNOSIS — M6281 Muscle weakness (generalized): Secondary | ICD-10-CM | POA: Diagnosis not present

## 2021-01-09 DIAGNOSIS — H35363 Drusen (degenerative) of macula, bilateral: Secondary | ICD-10-CM | POA: Diagnosis not present

## 2021-01-09 DIAGNOSIS — M25562 Pain in left knee: Secondary | ICD-10-CM | POA: Diagnosis not present

## 2021-01-09 DIAGNOSIS — Z961 Presence of intraocular lens: Secondary | ICD-10-CM | POA: Diagnosis not present

## 2021-01-11 ENCOUNTER — Telehealth: Payer: Self-pay | Admitting: Neurology

## 2021-01-11 NOTE — Telephone Encounter (Signed)
Pt would like a call to discuss the trouble with her hands.  Pt would like to discuss other medications to try for the difficulty she has controlling her hands

## 2021-01-11 NOTE — Telephone Encounter (Signed)
I called the patient.  The patient is on Lyrica 25 mg twice daily, reducing this dose in the past has improved her tremors.  She is reporting some difficulty with increased tremors recently.  We will have her stop the Lyrica, she is having minimal neuralgia pain currently.  She will call me if she does not do well with this medication change.

## 2021-01-11 NOTE — Addendum Note (Signed)
Addended by: Kathrynn Ducking on: 01/11/2021 03:53 PM   Modules accepted: Orders

## 2021-01-12 DIAGNOSIS — M25562 Pain in left knee: Secondary | ICD-10-CM | POA: Diagnosis not present

## 2021-01-12 DIAGNOSIS — M25662 Stiffness of left knee, not elsewhere classified: Secondary | ICD-10-CM | POA: Diagnosis not present

## 2021-01-12 DIAGNOSIS — M6281 Muscle weakness (generalized): Secondary | ICD-10-CM | POA: Diagnosis not present

## 2021-01-12 DIAGNOSIS — G8929 Other chronic pain: Secondary | ICD-10-CM | POA: Diagnosis not present

## 2021-01-26 DIAGNOSIS — M6281 Muscle weakness (generalized): Secondary | ICD-10-CM | POA: Diagnosis not present

## 2021-01-26 DIAGNOSIS — M25662 Stiffness of left knee, not elsewhere classified: Secondary | ICD-10-CM | POA: Diagnosis not present

## 2021-01-26 DIAGNOSIS — M25562 Pain in left knee: Secondary | ICD-10-CM | POA: Diagnosis not present

## 2021-01-26 DIAGNOSIS — G8929 Other chronic pain: Secondary | ICD-10-CM | POA: Diagnosis not present

## 2021-01-30 DIAGNOSIS — G8929 Other chronic pain: Secondary | ICD-10-CM | POA: Diagnosis not present

## 2021-01-30 DIAGNOSIS — M6281 Muscle weakness (generalized): Secondary | ICD-10-CM | POA: Diagnosis not present

## 2021-01-30 DIAGNOSIS — M25662 Stiffness of left knee, not elsewhere classified: Secondary | ICD-10-CM | POA: Diagnosis not present

## 2021-01-30 DIAGNOSIS — M25562 Pain in left knee: Secondary | ICD-10-CM | POA: Diagnosis not present

## 2021-02-06 ENCOUNTER — Telehealth: Payer: Self-pay | Admitting: Neurology

## 2021-02-06 DIAGNOSIS — M6281 Muscle weakness (generalized): Secondary | ICD-10-CM | POA: Diagnosis not present

## 2021-02-06 DIAGNOSIS — M25662 Stiffness of left knee, not elsewhere classified: Secondary | ICD-10-CM | POA: Diagnosis not present

## 2021-02-06 DIAGNOSIS — G8929 Other chronic pain: Secondary | ICD-10-CM | POA: Diagnosis not present

## 2021-02-06 DIAGNOSIS — M25562 Pain in left knee: Secondary | ICD-10-CM | POA: Diagnosis not present

## 2021-02-06 NOTE — Telephone Encounter (Signed)
Pt called wanting to speak to the RN or the Provider. Pt states that Dr. Sheliah Plane has prescribed Methylprednisolone 4 mg and she is wanting to know if there will be an interaction with the medications that she is already on. Please advise.

## 2021-02-06 NOTE — Telephone Encounter (Signed)
I called the patient.  The patient has been placed on a 6-day course of methylprednisolone for a torn meniscus.  There is no problem with her taking this medication.

## 2021-02-09 DIAGNOSIS — G500 Trigeminal neuralgia: Secondary | ICD-10-CM | POA: Diagnosis not present

## 2021-02-09 DIAGNOSIS — Z299 Encounter for prophylactic measures, unspecified: Secondary | ICD-10-CM | POA: Diagnosis not present

## 2021-02-09 DIAGNOSIS — M25562 Pain in left knee: Secondary | ICD-10-CM | POA: Diagnosis not present

## 2021-02-09 DIAGNOSIS — G8929 Other chronic pain: Secondary | ICD-10-CM | POA: Diagnosis not present

## 2021-02-09 DIAGNOSIS — M6281 Muscle weakness (generalized): Secondary | ICD-10-CM | POA: Diagnosis not present

## 2021-02-09 DIAGNOSIS — M25662 Stiffness of left knee, not elsewhere classified: Secondary | ICD-10-CM | POA: Diagnosis not present

## 2021-02-13 DIAGNOSIS — G8929 Other chronic pain: Secondary | ICD-10-CM | POA: Diagnosis not present

## 2021-02-13 DIAGNOSIS — M25562 Pain in left knee: Secondary | ICD-10-CM | POA: Diagnosis not present

## 2021-02-13 DIAGNOSIS — M25662 Stiffness of left knee, not elsewhere classified: Secondary | ICD-10-CM | POA: Diagnosis not present

## 2021-02-13 DIAGNOSIS — M6281 Muscle weakness (generalized): Secondary | ICD-10-CM | POA: Diagnosis not present

## 2021-02-16 DIAGNOSIS — M6281 Muscle weakness (generalized): Secondary | ICD-10-CM | POA: Diagnosis not present

## 2021-02-16 DIAGNOSIS — M25562 Pain in left knee: Secondary | ICD-10-CM | POA: Diagnosis not present

## 2021-02-16 DIAGNOSIS — M25662 Stiffness of left knee, not elsewhere classified: Secondary | ICD-10-CM | POA: Diagnosis not present

## 2021-02-16 DIAGNOSIS — G8929 Other chronic pain: Secondary | ICD-10-CM | POA: Diagnosis not present

## 2021-02-20 ENCOUNTER — Telehealth: Payer: Self-pay | Admitting: Neurology

## 2021-02-20 MED ORDER — PREGABALIN 25 MG PO CAPS: 25.0000 mg | ORAL_CAPSULE | Freq: Two times a day (BID) | ORAL | 1 refills | Status: DC

## 2021-02-20 NOTE — Telephone Encounter (Signed)
Pt called stating she has some questions about her medication LYRICA. Pt requesting a call back.

## 2021-02-20 NOTE — Telephone Encounter (Signed)
I called the patient.  The patient has come off of her Lyrica but is now having brief episodes of neuralgia pain.  I would recommend going back on the Lyrica, she was just on 25 mg twice daily, I will send in a prescription for this.

## 2021-02-23 DIAGNOSIS — M25562 Pain in left knee: Secondary | ICD-10-CM | POA: Diagnosis not present

## 2021-02-23 DIAGNOSIS — G8929 Other chronic pain: Secondary | ICD-10-CM | POA: Diagnosis not present

## 2021-02-23 DIAGNOSIS — M6281 Muscle weakness (generalized): Secondary | ICD-10-CM | POA: Diagnosis not present

## 2021-02-23 DIAGNOSIS — M25662 Stiffness of left knee, not elsewhere classified: Secondary | ICD-10-CM | POA: Diagnosis not present

## 2021-02-26 DIAGNOSIS — M25562 Pain in left knee: Secondary | ICD-10-CM | POA: Diagnosis not present

## 2021-02-26 DIAGNOSIS — M25662 Stiffness of left knee, not elsewhere classified: Secondary | ICD-10-CM | POA: Diagnosis not present

## 2021-02-26 DIAGNOSIS — M6281 Muscle weakness (generalized): Secondary | ICD-10-CM | POA: Diagnosis not present

## 2021-02-26 DIAGNOSIS — G8929 Other chronic pain: Secondary | ICD-10-CM | POA: Diagnosis not present

## 2021-02-27 DIAGNOSIS — Z7189 Other specified counseling: Secondary | ICD-10-CM | POA: Diagnosis not present

## 2021-02-27 DIAGNOSIS — E7800 Pure hypercholesterolemia, unspecified: Secondary | ICD-10-CM | POA: Diagnosis not present

## 2021-02-27 DIAGNOSIS — Z6828 Body mass index (BMI) 28.0-28.9, adult: Secondary | ICD-10-CM | POA: Diagnosis not present

## 2021-02-27 DIAGNOSIS — Z1331 Encounter for screening for depression: Secondary | ICD-10-CM | POA: Diagnosis not present

## 2021-02-27 DIAGNOSIS — Z299 Encounter for prophylactic measures, unspecified: Secondary | ICD-10-CM | POA: Diagnosis not present

## 2021-02-27 DIAGNOSIS — I10 Essential (primary) hypertension: Secondary | ICD-10-CM | POA: Diagnosis not present

## 2021-02-27 DIAGNOSIS — R5383 Other fatigue: Secondary | ICD-10-CM | POA: Diagnosis not present

## 2021-02-27 DIAGNOSIS — Z0000 Encounter for general adult medical examination without abnormal findings: Secondary | ICD-10-CM | POA: Diagnosis not present

## 2021-02-27 DIAGNOSIS — Z1339 Encounter for screening examination for other mental health and behavioral disorders: Secondary | ICD-10-CM | POA: Diagnosis not present

## 2021-02-27 DIAGNOSIS — Z79899 Other long term (current) drug therapy: Secondary | ICD-10-CM | POA: Diagnosis not present

## 2021-03-02 DIAGNOSIS — M6281 Muscle weakness (generalized): Secondary | ICD-10-CM | POA: Diagnosis not present

## 2021-03-02 DIAGNOSIS — G8929 Other chronic pain: Secondary | ICD-10-CM | POA: Diagnosis not present

## 2021-03-02 DIAGNOSIS — M25662 Stiffness of left knee, not elsewhere classified: Secondary | ICD-10-CM | POA: Diagnosis not present

## 2021-03-02 DIAGNOSIS — M25562 Pain in left knee: Secondary | ICD-10-CM | POA: Diagnosis not present

## 2021-03-05 DIAGNOSIS — M25662 Stiffness of left knee, not elsewhere classified: Secondary | ICD-10-CM | POA: Diagnosis not present

## 2021-03-05 DIAGNOSIS — G8929 Other chronic pain: Secondary | ICD-10-CM | POA: Diagnosis not present

## 2021-03-05 DIAGNOSIS — M6281 Muscle weakness (generalized): Secondary | ICD-10-CM | POA: Diagnosis not present

## 2021-03-05 DIAGNOSIS — M25562 Pain in left knee: Secondary | ICD-10-CM | POA: Diagnosis not present

## 2021-03-09 DIAGNOSIS — M25562 Pain in left knee: Secondary | ICD-10-CM | POA: Diagnosis not present

## 2021-03-09 DIAGNOSIS — M6281 Muscle weakness (generalized): Secondary | ICD-10-CM | POA: Diagnosis not present

## 2021-03-09 DIAGNOSIS — M25662 Stiffness of left knee, not elsewhere classified: Secondary | ICD-10-CM | POA: Diagnosis not present

## 2021-03-09 DIAGNOSIS — G8929 Other chronic pain: Secondary | ICD-10-CM | POA: Diagnosis not present

## 2021-03-11 ENCOUNTER — Other Ambulatory Visit: Payer: Self-pay | Admitting: Neurology

## 2021-03-12 DIAGNOSIS — M6281 Muscle weakness (generalized): Secondary | ICD-10-CM | POA: Diagnosis not present

## 2021-03-12 DIAGNOSIS — M25562 Pain in left knee: Secondary | ICD-10-CM | POA: Diagnosis not present

## 2021-03-12 DIAGNOSIS — G8929 Other chronic pain: Secondary | ICD-10-CM | POA: Diagnosis not present

## 2021-03-12 DIAGNOSIS — M25662 Stiffness of left knee, not elsewhere classified: Secondary | ICD-10-CM | POA: Diagnosis not present

## 2021-03-14 ENCOUNTER — Ambulatory Visit: Payer: Medicare PPO | Admitting: Dermatology

## 2021-03-14 ENCOUNTER — Encounter: Payer: Self-pay | Admitting: Dermatology

## 2021-03-14 ENCOUNTER — Other Ambulatory Visit: Payer: Self-pay

## 2021-03-14 DIAGNOSIS — L821 Other seborrheic keratosis: Secondary | ICD-10-CM | POA: Diagnosis not present

## 2021-03-29 ENCOUNTER — Encounter: Payer: Self-pay | Admitting: Dermatology

## 2021-03-29 NOTE — Progress Notes (Signed)
   Follow-Up Visit   Subjective  Shelby Hill is a 85 y.o. female who presents for the following: Follow-up (Check facial lesions she feels to touch nothing present that could see).  Possible new growths on face Location:  Duration:  Quality:  Associated Signs/Symptoms: Modifying Factors:  Severity:  Timing: Context:   Objective  Well appearing patient in no apparent distress; mood and affect are within normal limits. Mid Forehead Flesh-colored subtly textured papules, palpable but not really visible.  Although these do bother patient, I explained that there are benign and quite subtle to other people so aggressive removal is contraindicated.  We discussed freezing the spots but this was deferred.  She understands that if this is done it would typically not be covered by health insurance.    A focused examination was performed including head and neck. Relevant physical exam findings are noted in the Assessment and Plan.   Assessment & Plan    Seborrheic keratosis Mid Forehead  No intervention initiated      I, Lavonna Monarch, MD, have reviewed all documentation for this visit.  The documentation on 03/29/21 for the exam, diagnosis, procedures, and orders are all accurate and complete.

## 2021-04-02 DIAGNOSIS — M25562 Pain in left knee: Secondary | ICD-10-CM | POA: Diagnosis not present

## 2021-04-02 DIAGNOSIS — R600 Localized edema: Secondary | ICD-10-CM | POA: Diagnosis not present

## 2021-04-02 DIAGNOSIS — M25462 Effusion, left knee: Secondary | ICD-10-CM | POA: Diagnosis not present

## 2021-04-02 DIAGNOSIS — M25362 Other instability, left knee: Secondary | ICD-10-CM | POA: Diagnosis not present

## 2021-04-02 DIAGNOSIS — M7122 Synovial cyst of popliteal space [Baker], left knee: Secondary | ICD-10-CM | POA: Diagnosis not present

## 2021-04-05 DIAGNOSIS — T85848D Pain due to other internal prosthetic devices, implants and grafts, subsequent encounter: Secondary | ICD-10-CM | POA: Diagnosis not present

## 2021-04-05 DIAGNOSIS — G8929 Other chronic pain: Secondary | ICD-10-CM | POA: Diagnosis not present

## 2021-04-05 DIAGNOSIS — M25562 Pain in left knee: Secondary | ICD-10-CM | POA: Diagnosis not present

## 2021-04-10 DIAGNOSIS — Z23 Encounter for immunization: Secondary | ICD-10-CM | POA: Diagnosis not present

## 2021-04-23 ENCOUNTER — Other Ambulatory Visit: Payer: Self-pay

## 2021-04-23 ENCOUNTER — Encounter: Payer: Self-pay | Admitting: Adult Health

## 2021-04-23 ENCOUNTER — Ambulatory Visit: Payer: Medicare PPO | Admitting: Adult Health

## 2021-04-23 VITALS — BP 144/92 | HR 87 | Ht 66.0 in | Wt 170.0 lb

## 2021-04-23 DIAGNOSIS — G500 Trigeminal neuralgia: Secondary | ICD-10-CM | POA: Diagnosis not present

## 2021-04-23 NOTE — Patient Instructions (Addendum)
Your Plan:  Continue current treatment regimen - if symptoms start to worsen, please let us know   Continue to follow with pain management as scheduled     Follow up in 6 months with Judson Roch, NP or call earlier if needed     Thank you for coming to see [place] at [facility]. I hope we have been able to provide you high quality care today.  You may receive a patient satisfaction survey over the next few weeks. We would appreciate your feedback and comments so that we may continue to improve ourselves and the health of our patients.

## 2021-04-23 NOTE — Progress Notes (Signed)
PATIENT: Shelby Hill DOB: Jul 23, 1935  REASON FOR VISIT: follow up HISTORY FROM: patient  Chief Complaint  Patient presents with   Follow-up    Rm 3 alone Pt is well and stable. Also Wants to discuss medications      HISTORY OF PRESENT ILLNESS: Today 04/23/21  Returns today to discuss medication management for possible SUNCT headache with left V1 distribution nerve pain. At prior visit, attempted to stop pregabalin as she was doing well and possible side effect of tremors but had increase in nerve pain attacks therefore pregabalin restarted currently on 25 mg twice daily as well as continued use of lamotrigine 25 mg twice daily and duloxetine 60 mg daily.  Was experiencing attacks 2-3x per week which would subside after use of ice. Reports being placed on extra strength Tylenol by pain management MD for knee pain and this seemed to greatly improve nerve pain and no recurrence of attacks in the past couple of weeks.  She continues to experience tremors but more so only interferes with writing.  She did not notice any effect on tremors with decreasing or stopping pregabalin dosage.    History provided for reference purposes only Update 09/25/2020 Shelby Hill is an 85 year old female with history of left V1 distribution sharp pains, felt to be some kind of SUNCT headache. Sometimes in the left nostril.  Is on Lamictal, Lyrica, and Cymbalta.  Has had side effect of tremor, unclear if related to the Lamictal, or the Lyrica.  In the last month, we have decreased Lyrica down to 50 mg twice daily.  Has noted an improvement in the tremor.  With the addition of Cymbalta, also the Xanax from PCP, she has had no further episodes of pain.  Remains on Lamictal 25 mg twice daily.  Her anxiety is better.  She remains active, drives. Doesn't notice the tremor unless writing, she doesn't think is an issue.  Here today for evaluation unaccompanied.  Update 05/24/2020 Shelby Hill is an 85 year old female  with history of left V1 distribution sharp pains, is felt to possibly have a SUNCT headache.  Has had side effect of tremor, which is felt to be related to Lamictal.  Back in June, she was able to cut her doses of Lamictal (25/25) and Lyrica (50/50) back.  However of recent, she has had more pain we had increased. Currently on Lamictal 25/50 (11/29), Lyrica 50/100 (11/17), but she increased to Lamictal 50 mg twice a day, Lyrica 100 mg twice a day.  A few weeks ago called her PCP, added Xanax.  Feels the pain is related to her anxiety, was worried with the holidays.  The Xanax has been quite helpful, pain is better controlled, but still has the sharp twinges of pain to the left to be one area.  Does not notice tremor with higher doses of medications. Didn't take prednisone. Anxiety is an issue for her. Here today for follow-up unaccompanied.   11/24/2019 SS: Shelby Hill is an 85 year old female with history of left V1 distribution sharp pains. She is felt to possibly have a SUNCT headache was treated with medication. She had dramatic response, had total elimitation of the head pain. She did have side effect of tremor on Lamictal. She remains on Lamictal has cut her dose to 25 mg twice daily. Her dose of Lyrica has been reduced, is taking 50 mg twice a day.  She is fearful to cut the dose of either medications any further.  At times if  she sneezes or yawns, she may have a twinge of pain to her right upper forehead, will then radiate to the left V1 area around her eye.  Is mild at this point, not persistent.  Tremor is much improved as Lamictal has been reduced.  So much improved, she does not even notice it, only present when nervous or anxious.  Has some tremor today, she drove from [place], had a lot of traffic, was late.  Presents today unaccompanied.  REVIEW OF SYSTEMS: Out of a complete 14 system review of symptoms, the patient complains only of the following symptoms, and all other reviewed systems are  negative.  Facial pain Knee pain Tremor  ALLERGIES: Allergies  Allergen Reactions   Codeine Nausea And Vomiting    HOME MEDICATIONS: Outpatient Medications Prior to Visit  Medication Sig Dispense Refill   acetaminophen (TYLENOL) 500 MG tablet Take 500 mg by mouth every 6 (six) hours as needed.     ALPRAZolam (XANAX) 0.5 MG tablet Take 0.5 tablets (0.25 mg total) by mouth at bedtime as needed for anxiety or sleep. 3 tablet 0   aspirin EC 81 MG tablet Take 81 mg by mouth daily. Swallow whole.     calcitonin, salmon, (MIACALCIN/FORTICAL) 200 UNIT/ACT nasal spray USE ONE SPRAY IN ONE NOSTRIL ONCE DAILY, ALTERNATING NOSTRILS     DULoxetine (CYMBALTA) 60 MG capsule TAKE 1 CAPSULE BY MOUTH DAILY 30 capsule 5   lamoTRIgine (LAMICTAL) 25 MG tablet Take 25 mg twice daily 60 tablet 5   pregabalin (LYRICA) 25 MG capsule Take 1 capsule (25 mg total) by mouth 2 (two) times daily. 180 capsule 1   rosuvastatin (CRESTOR) 5 MG tablet Take 5 mg by mouth at bedtime.     No facility-administered medications prior to visit.    PAST MEDICAL HISTORY: Past Medical History:  Diagnosis Date   BCC (basal cell carcinoma) 02/09/1988   left upper thigh,left lower thigh tx cx3 [id number]   BCC (basal cell carcinoma) 10/04/1962   left clavicle,left breast, Back   BCC (basal cell carcinoma) 03/06/1989   mid back   BCC (basal cell carcinoma) 04/02/1990   left upper shoulder (CX35FU),left upper breast(CX35FU),above left clavicle, right outer back,   BCC (basal cell carcinoma) 04/01/1991   right cetner outer brown (CX3+exc. ), upper left chest,upper left chest medial, right back   BCC (basal cell carcinoma) 02/01/1994   right back   BCC (basal cell carcinoma) 03/26/1995   central upper back (CX35FU),left upper back (CX35FU)   BCC (basal cell carcinoma) 12/30/1997   right cheek (exc. )   BCC (basal cell carcinoma) 08/06/1999   upper right shin (CX35FU), upper right shin (CX35FU)   BCC (basal cell carcinoma)  08/24/1998   left breast (CX35FU)   BCC (basal cell carcinoma) 08/05/2001   right inner cheek (MOHS), Left upper back (CX35FU),right upper back (CX35FU),, right top hand (CX35FU)   BCC (basal cell carcinoma) 08/02/2002   upper back(CX35FU)   BCC (basal cell carcinoma) 09/12/2003   right mid back (CX35FU), right thigh (deep freeze+aldara) Left thigh outer (deep freeze +aldara)   BCC (basal cell carcinoma) 06/03/2005   rigth scapula (CX35FU),    BCC (basal cell carcinoma) 06/05/2006   right ant. lateral thigh (CX35FU)   BCC (basal cell carcinoma) 08/08/2010   right forearm (CX35FU)   BCC (basal cell carcinoma) 07/01/2011   right shin (MOHS)   BCC (basal cell carcinoma) 12/14/2013   left upper thigh(CX35FU), left upper arm (CX35FU), mid right back (CX35FU), lower  right back (CX35FU)   BCC (basal cell carcinoma) 02/28/2016   left forearm   HLD (hyperlipidemia)    SCC (squamous cell carcinoma) 08/11/2000   left nose (MOHS)   SCC (squamous cell carcinoma) 09/12/2003   upper back (CX35FU)   SCC (squamous cell carcinoma) 06/03/2005   left v of neck (CX35FU), Left shin sup. (CX35FU)   SCC (squamous cell carcinoma) 06/05/2006   mid chest (CX35FU)   SCC (squamous cell carcinoma) 08/08/2010   left temple (CX35FU), Left upper arm (CX35FU), right forearm/wrist (CX35FU)   SCC (squamous cell carcinoma) 02/06/2012   Left post neck-tx p bx   SCC (squamous cell carcinoma) 11/09/2012   left hand-tx p bx-, left chest sup -tx p bx, left chest inf. (CX35FU)   SCC (squamous cell carcinoma) 08/04/2014   left clavicle-tx p bx   SCC (squamous cell carcinoma) 11/30/2014   lower right leg distal (CX35FU), lower right leg, prox. (CX35FU)   SCC (squamous cell carcinoma) 05/17/2015   right jawline (CX35FU), Left sideburn (CX35FU)   SCC (squamous cell carcinoma) 09/10/2017   left forearm-tx p bx, left temple -tx p bx    PAST SURGICAL HISTORY: Past Surgical History:  Procedure Laterality Date    COLONOSCOPY  01/01/2012   Procedure: COLONOSCOPY;  Surgeon: Rogene Houston, MD;  [facility]: [facility];  Service: Endoscopy;  Laterality: N/A;  830   INTRAMEDULLARY (IM) NAIL INTERTROCHANTERIC Left 04/26/2017   Procedure: INTRAMEDULLARY (IM) NAIL INTERTROCHANTRIC;  Surgeon: Leandrew Koyanagi, MD;  Location: [place];  Service: Orthopedics;  Laterality: Left;   KNEE SURGERY     ORIF TIBIA PLATEAU      FAMILY HISTORY: Family History  Problem Relation Age of Onset   CAD Mother    Stroke Sister    Stroke Brother     SOCIAL HISTORY: Social History   Socioeconomic History   Marital status: Married    Spouse name: Not on file   Number of children: Not on file   Years of education: Not on file   Highest education level: Not on file  Occupational History   Not on file  Tobacco Use   Smoking status: Never   Smokeless tobacco: Never  Vaping Use   Vaping Use: Never used  Substance and Sexual Activity   Alcohol use: Yes    Comment: Occasionally   Drug use: No   Sexual activity: Not on file  Other Topics Concern   Not on file  Social History Narrative   ** Merged History Encounter **    Right handed   Caffeine~ 1-2 cups per day    Social Determinants of Health   Financial Resource Strain: Not on file  Food Insecurity: Not on file  Transportation Needs: Not on file  Physical Activity: Not on file  Stress: Not on file  Social Connections: Not on file  Intimate Partner Violence: Not on file   PHYSICAL EXAM  Vitals:   04/23/21 1504  BP: (!) 144/92  Pulse: 87  Weight: 170 lb (77.1 kg)  Height: 5\' 6"  (1.676 m)   Body mass index is 27.44 kg/m.  Generalized: Well developed, in no acute distress   Neurological examination  Mentation: Alert oriented to time, place, history taking. Follows all commands speech and language fluent Cranial nerve II-XII: Pupils were equal round reactive to light. Extraocular movements were full, visual field were full on confrontational test. Facial  sensation and strength were normal. Head turning and shoulder shrug  were normal and symmetric. Motor:  The motor testing reveals 5 over 5 strength of all 4 extremities. Good symmetric motor tone is noted throughout.  Sensory: Sensory testing is intact to soft touch on all 4 extremities. No evidence of extinction is noted.  Coordination: Cerebellar testing reveals good finger-nose-finger and heel-to-shin bilaterally. Mild postural tremor b/l upper extremities.  No evidence of tremor at rest, bradykinesia or cogwheel rigidity Gait and station: Gait is normal.  Reflexes: Deep tendon reflexes are symmetric and normal bilaterally.   DIAGNOSTIC DATA (LABS, IMAGING, TESTING) - I reviewed patient records, labs, notes, testing and imaging myself where available.  Lab Results  Component Value Date   WBC 6.3 12/09/2018   HGB 13.9 12/09/2018   HCT 41.9 12/09/2018   MCV 92 12/09/2018   PLT 194 12/09/2018      Component Value Date/Time   NA 144 12/09/2018 1549   K 4.0 12/09/2018 1549   CL 104 12/09/2018 1549   CO2 24 12/09/2018 1549   GLUCOSE 97 12/09/2018 1549   GLUCOSE 98 04/28/2017 0456   BUN 18 12/09/2018 1549   CREATININE 0.65 12/09/2018 1549   CALCIUM 9.8 12/09/2018 1549   PROT 7.2 12/09/2018 1549   ALBUMIN 4.4 12/09/2018 1549   AST 18 12/09/2018 1549   ALT 10 12/09/2018 1549   ALKPHOS 71 12/09/2018 1549   BILITOT 0.6 12/09/2018 1549   GFRNONAA 84 12/09/2018 1549   GFRAA 97 12/09/2018 1549   No results found for: CHOL, HDL, LDLCALC, LDLDIRECT, TRIG, CHOLHDL No results found for: HGBA1C No results found for: VITAMINB12 No results found for: TSH  ASSESSMENT AND PLAN 85 y.o. year old female  has a past medical history of BCC (basal cell carcinoma) (02/09/1988), BCC (basal cell carcinoma) (10/04/1962), BCC (basal cell carcinoma) (03/06/1989), BCC (basal cell carcinoma) (04/02/1990), BCC (basal cell carcinoma) (04/01/1991), BCC (basal cell carcinoma) (02/01/1994), BCC (basal cell  carcinoma) (03/26/1995), BCC (basal cell carcinoma) (12/30/1997), BCC (basal cell carcinoma) (08/06/1999), BCC (basal cell carcinoma) (08/24/1998), BCC (basal cell carcinoma) (08/05/2001), BCC (basal cell carcinoma) (08/02/2002), BCC (basal cell carcinoma) (09/12/2003), BCC (basal cell carcinoma) (06/03/2005), BCC (basal cell carcinoma) (06/05/2006), BCC (basal cell carcinoma) (08/08/2010), BCC (basal cell carcinoma) (07/01/2011), BCC (basal cell carcinoma) (12/14/2013), BCC (basal cell carcinoma) (02/28/2016), HLD (hyperlipidemia), SCC (squamous cell carcinoma) (08/11/2000), SCC (squamous cell carcinoma) (09/12/2003), SCC (squamous cell carcinoma) (06/03/2005), SCC (squamous cell carcinoma) (06/05/2006), SCC (squamous cell carcinoma) (08/08/2010), SCC (squamous cell carcinoma) (02/06/2012), SCC (squamous cell carcinoma) (11/09/2012), SCC (squamous cell carcinoma) (08/04/2014), SCC (squamous cell carcinoma) (11/30/2014), SCC (squamous cell carcinoma) (05/17/2015), and SCC (squamous cell carcinoma) (09/10/2017). here with:  1. 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 Continue to monitor.     Follow-up in 6 months with Judson Roch, NP or sooner if needed    I spent 29 minutes of face-to-face and non-face-to-face time with patient.  This included previsit chart review, lab review, study review, order entry, electronic health record documentation, patient education and discussion  regarding above diagnosis, current medications and potential further treatment options and  answered all other questions to patient satisfaction  [name], [facility]  [facility] [address] [place], [place] [address]  Phone [phone] Fax [phone] Note: This document was prepared with digital dictation and possible smart phrase technology. Any transcriptional errors that result from this process are unintentional.

## 2021-04-24 ENCOUNTER — Ambulatory Visit: Payer: Self-pay | Admitting: Neurology

## 2021-04-26 ENCOUNTER — Telehealth: Payer: Self-pay | Admitting: Dermatology

## 2021-04-26 NOTE — Telephone Encounter (Signed)
Patient came by the office today at 12:36 PM saying that she wanted to know what happened at her last appointment (03/14/2021) with Lavonna Monarch, M.D. ([place]).  Patient states that ST asked her towards the end of the visit if she had another dermatologist and she told him "no".  Patient states that ST told her that there was nothing else he could do for her and left the room without saying anything else.  Patient also got a $40.00 refund from the visit.  Also, she has an appointment in March 2023 that she is questioning whether she should keep or not if she is being dismissed from the practice.  Patient states that she does not know what triggered any of this, but would like to know.  She says that she has no intention of going anywhere else unless she is being dismissed.

## 2021-05-07 NOTE — Telephone Encounter (Addendum)
Patient called and said that she wanted to know if I had spoken with Lavonna Monarch, M.D (ST) and I explained that the message was sent and that I also spoke in person to [place].  I read Baker Janus the message from [place].  [place]er[place] was not satisfied with ST's response and said she remembers the visit differently.  Jammi also said that she hated that it has come to this, but to cancel her next appointment.

## 2021-05-30 DIAGNOSIS — M25562 Pain in left knee: Secondary | ICD-10-CM | POA: Diagnosis not present

## 2021-05-30 DIAGNOSIS — T85848D Pain due to other internal prosthetic devices, implants and grafts, subsequent encounter: Secondary | ICD-10-CM | POA: Diagnosis not present

## 2021-05-30 DIAGNOSIS — G8929 Other chronic pain: Secondary | ICD-10-CM | POA: Diagnosis not present

## 2021-06-04 ENCOUNTER — Emergency Department (HOSPITAL_COMMUNITY): Payer: Medicare PPO

## 2021-06-04 ENCOUNTER — Inpatient Hospital Stay (HOSPITAL_COMMUNITY)
Admission: EM | Admit: 2021-06-04 | Discharge: 2021-06-07 | DRG: 065 | Disposition: A | Discharge status: Rehabilitation Facility | Payer: Medicare PPO | Attending: Neurology | Admitting: Neurology

## 2021-06-04 DIAGNOSIS — R4702 Dysphasia: Secondary | ICD-10-CM | POA: Diagnosis present

## 2021-06-04 DIAGNOSIS — I10 Essential (primary) hypertension: Secondary | ICD-10-CM | POA: Diagnosis present

## 2021-06-04 DIAGNOSIS — Z7982 Long term (current) use of aspirin: Secondary | ICD-10-CM

## 2021-06-04 DIAGNOSIS — G250 Essential tremor: Secondary | ICD-10-CM | POA: Diagnosis not present

## 2021-06-04 DIAGNOSIS — R600 Localized edema: Secondary | ICD-10-CM | POA: Diagnosis not present

## 2021-06-04 DIAGNOSIS — Z79899 Other long term (current) drug therapy: Secondary | ICD-10-CM | POA: Diagnosis not present

## 2021-06-04 DIAGNOSIS — G500 Trigeminal neuralgia: Secondary | ICD-10-CM | POA: Diagnosis not present

## 2021-06-04 DIAGNOSIS — F419 Anxiety disorder, unspecified: Secondary | ICD-10-CM | POA: Diagnosis not present

## 2021-06-04 DIAGNOSIS — I610 Nontraumatic intracerebral hemorrhage in hemisphere, subcortical: Principal | ICD-10-CM | POA: Diagnosis present

## 2021-06-04 DIAGNOSIS — R29810 Facial weakness: Secondary | ICD-10-CM | POA: Diagnosis present

## 2021-06-04 DIAGNOSIS — E119 Type 2 diabetes mellitus without complications: Secondary | ICD-10-CM | POA: Diagnosis not present

## 2021-06-04 DIAGNOSIS — E440 Moderate protein-calorie malnutrition: Secondary | ICD-10-CM | POA: Diagnosis not present

## 2021-06-04 DIAGNOSIS — Z823 Family history of stroke: Secondary | ICD-10-CM | POA: Diagnosis not present

## 2021-06-04 DIAGNOSIS — Z8673 Personal history of transient ischemic attack (TIA), and cerebral infarction without residual deficits: Secondary | ICD-10-CM | POA: Diagnosis not present

## 2021-06-04 DIAGNOSIS — I69198 Other sequelae of nontraumatic intracerebral hemorrhage: Secondary | ICD-10-CM | POA: Diagnosis not present

## 2021-06-04 DIAGNOSIS — Z85828 Personal history of other malignant neoplasm of skin: Secondary | ICD-10-CM | POA: Diagnosis not present

## 2021-06-04 DIAGNOSIS — Z885 Allergy status to narcotic agent status: Secondary | ICD-10-CM | POA: Diagnosis not present

## 2021-06-04 DIAGNOSIS — R4701 Aphasia: Secondary | ICD-10-CM | POA: Diagnosis present

## 2021-06-04 DIAGNOSIS — I619 Nontraumatic intracerebral hemorrhage, unspecified: Secondary | ICD-10-CM

## 2021-06-04 DIAGNOSIS — I69120 Aphasia following nontraumatic intracerebral hemorrhage: Secondary | ICD-10-CM | POA: Diagnosis not present

## 2021-06-04 DIAGNOSIS — R58 Hemorrhage, not elsewhere classified: Secondary | ICD-10-CM | POA: Diagnosis not present

## 2021-06-04 DIAGNOSIS — I615 Nontraumatic intracerebral hemorrhage, intraventricular: Secondary | ICD-10-CM | POA: Diagnosis present

## 2021-06-04 DIAGNOSIS — G936 Cerebral edema: Secondary | ICD-10-CM | POA: Diagnosis not present

## 2021-06-04 DIAGNOSIS — F05 Delirium due to known physiological condition: Secondary | ICD-10-CM | POA: Diagnosis not present

## 2021-06-04 DIAGNOSIS — R410 Disorientation, unspecified: Secondary | ICD-10-CM | POA: Diagnosis not present

## 2021-06-04 DIAGNOSIS — I160 Hypertensive urgency: Secondary | ICD-10-CM | POA: Diagnosis not present

## 2021-06-04 DIAGNOSIS — I639 Cerebral infarction, unspecified: Secondary | ICD-10-CM | POA: Diagnosis not present

## 2021-06-04 DIAGNOSIS — I618 Other nontraumatic intracerebral hemorrhage: Secondary | ICD-10-CM | POA: Diagnosis not present

## 2021-06-04 DIAGNOSIS — I161 Hypertensive emergency: Secondary | ICD-10-CM | POA: Diagnosis not present

## 2021-06-04 DIAGNOSIS — E785 Hyperlipidemia, unspecified: Secondary | ICD-10-CM | POA: Diagnosis not present

## 2021-06-04 DIAGNOSIS — I6389 Other cerebral infarction: Secondary | ICD-10-CM | POA: Diagnosis not present

## 2021-06-04 DIAGNOSIS — S0093XA Contusion of unspecified part of head, initial encounter: Secondary | ICD-10-CM | POA: Diagnosis not present

## 2021-06-04 DIAGNOSIS — R29818 Other symptoms and signs involving the nervous system: Secondary | ICD-10-CM | POA: Diagnosis not present

## 2021-06-04 LAB — DIFFERENTIAL
Abs Immature Granulocytes: 0.01 10*3/uL (ref 0.00–0.07)
Basophils Absolute: 0 10*3/uL (ref 0.0–0.1)
Basophils Relative: 1 %
Eosinophils Absolute: 0.1 10*3/uL (ref 0.0–0.5)
Eosinophils Relative: 2 %
Immature Granulocytes: 0 %
Lymphocytes Relative: 22 %
Lymphs Abs: 1.4 10*3/uL (ref 0.7–4.0)
Monocytes Absolute: 0.5 10*3/uL (ref 0.1–1.0)
Monocytes Relative: 8 %
Neutro Abs: 4.4 10*3/uL (ref 1.7–7.7)
Neutrophils Relative %: 67 %

## 2021-06-04 LAB — COMPREHENSIVE METABOLIC PANEL
ALT: 9 U/L (ref 0–44)
AST: 20 U/L (ref 15–41)
Albumin: 3.5 g/dL (ref 3.5–5.0)
Alkaline Phosphatase: 67 U/L (ref 38–126)
Anion gap: 5 (ref 5–15)
BUN: 17 mg/dL (ref 8–23)
CO2: 27 mmol/L (ref 22–32)
Calcium: 8.6 mg/dL — ABNORMAL LOW (ref 8.9–10.3)
Chloride: 103 mmol/L (ref 98–111)
Creatinine, Ser: 0.73 mg/dL (ref 0.44–1.00)
GFR, Estimated: >60 mL/min (ref >60)
Glucose, Bld: 95 mg/dL (ref 70–99)
Potassium: 3.8 mmol/L (ref 3.5–5.1)
Sodium: 135 mmol/L (ref 135–145)
Total Bilirubin: 1.1 mg/dL (ref 0.3–1.2)
Total Protein: 6.3 g/dL — ABNORMAL LOW (ref 6.5–8.1)

## 2021-06-04 LAB — APTT: aPTT: 27 seconds (ref 24–36)

## 2021-06-04 LAB — CBC
HCT: 39.3 % (ref 36.0–46.0)
Hemoglobin: 12.9 g/dL (ref 12.0–15.0)
MCH: 30.5 pg (ref 26.0–34.0)
MCHC: 32.8 g/dL (ref 30.0–36.0)
MCV: 92.9 fL (ref 80.0–100.0)
Platelets: 210 10*3/uL (ref 150–400)
RBC: 4.23 MIL/uL (ref 3.87–5.11)
RDW: 13.2 % (ref 11.5–15.5)
WBC: 6.5 10*3/uL (ref 4.0–10.5)
nRBC: 0 % (ref 0.0–0.2)

## 2021-06-04 LAB — I-STAT CHEM 8, ED
BUN: 17 mg/dL (ref 8–23)
Calcium, Ion: 1.2 mmol/L (ref 1.15–1.40)
Chloride: 103 mmol/L (ref 98–111)
Creatinine, Ser: 0.5 mg/dL (ref 0.44–1.00)
Glucose, Bld: 96 mg/dL (ref 70–99)
HCT: 39 % (ref 36.0–46.0)
Hemoglobin: 13.3 g/dL (ref 12.0–15.0)
Potassium: 3.5 mmol/L (ref 3.5–5.1)
Sodium: 140 mmol/L (ref 135–145)
TCO2: 28 mmol/L (ref 22–32)

## 2021-06-04 LAB — PROTIME-INR
INR: 1 (ref 0.8–1.2)
Prothrombin Time: 13.5 seconds (ref 11.4–15.2)

## 2021-06-04 IMAGING — CT CT HEAD CODE STROKE
3 series · 16 of 37 positions shown, 18 images · non-contrast
Comparison: No prior head CT, correlation is made with MRI brain
[DATE].

CLINICAL DATA: Code stroke.

EXAM:
CT HEAD WITHOUT CONTRAST
TECHNIQUE: Contiguous axial images were obtained from the base of the skull
through the vertex without intravenous contrast.

[Series 3: head 5.0 st · axial · 0.45mm/px · z∈[+1093,+1213]mm · 7 of 32 slices shown, 9 images]
[im 4/32  brain]
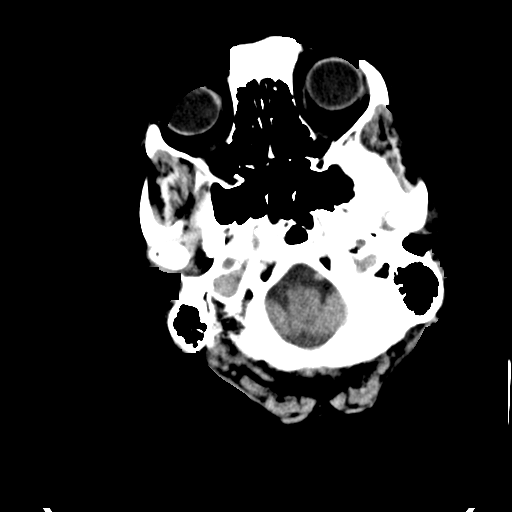
[im 4/32  bone]
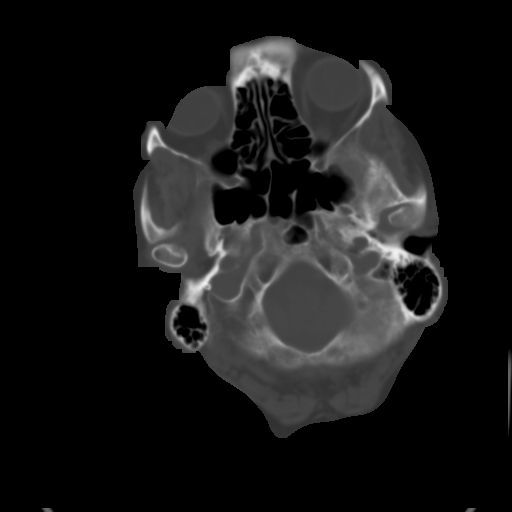
[im 8/32  brain]
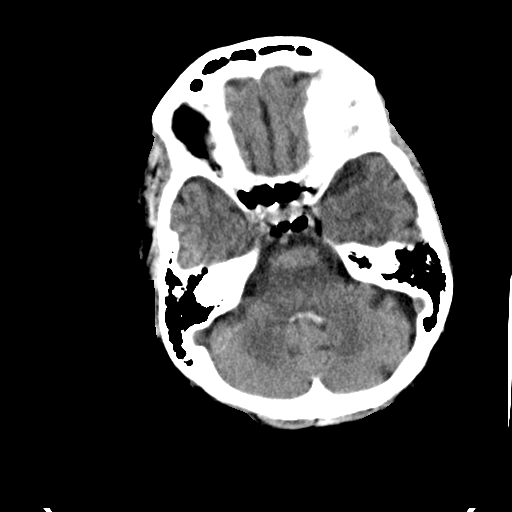
[im 12/32  brain]
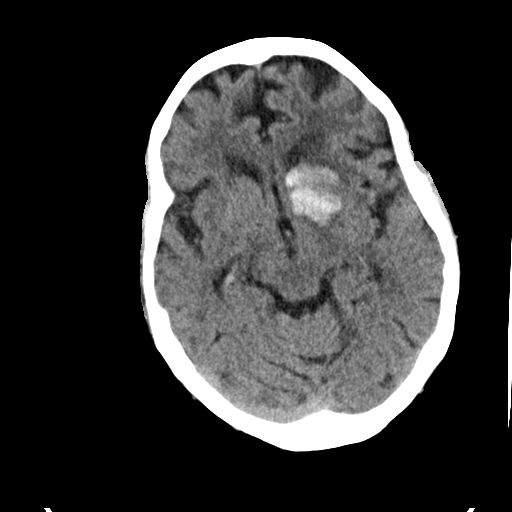
[im 16/32  brain]
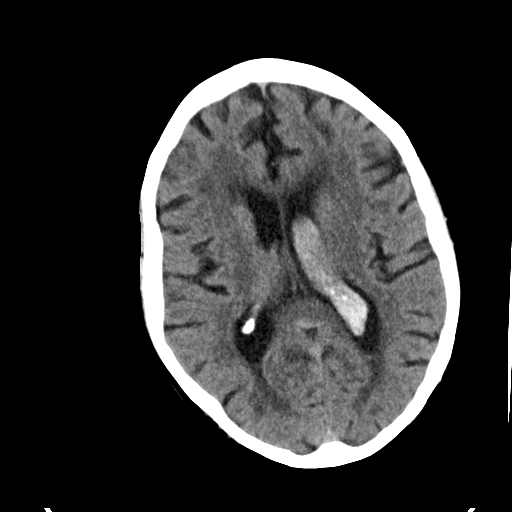
[im 20/32  brain]
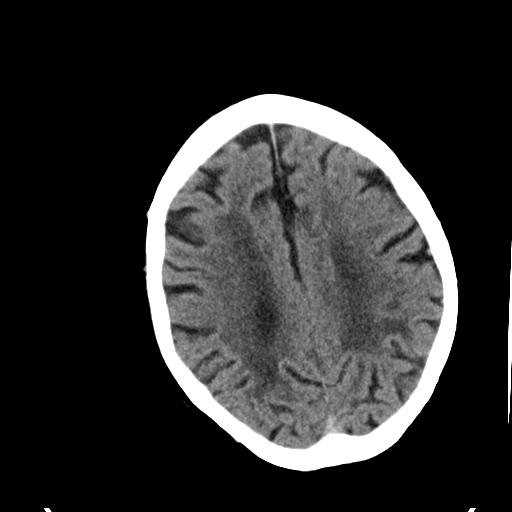
[im 20/32  bone]
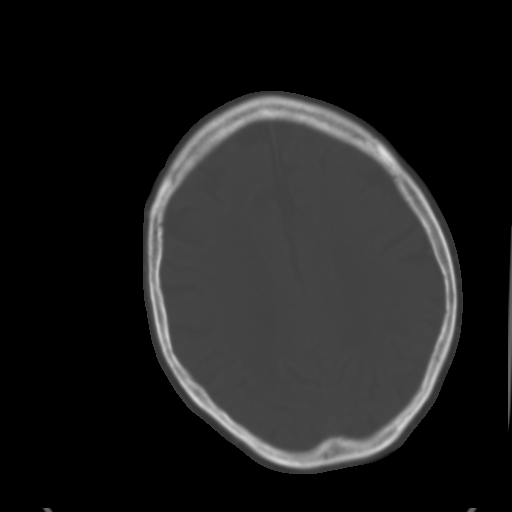
[im 24/32  brain]
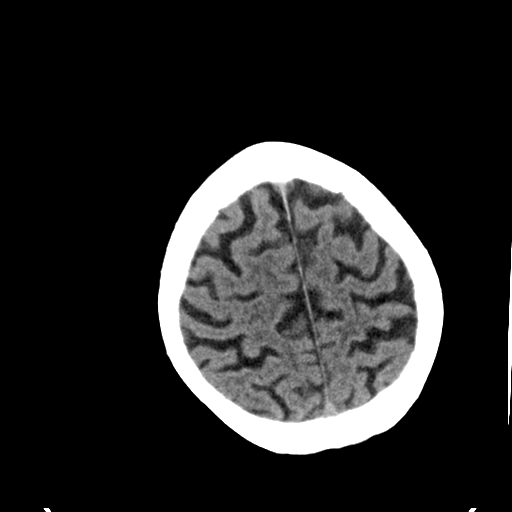
[im 28/32  brain]
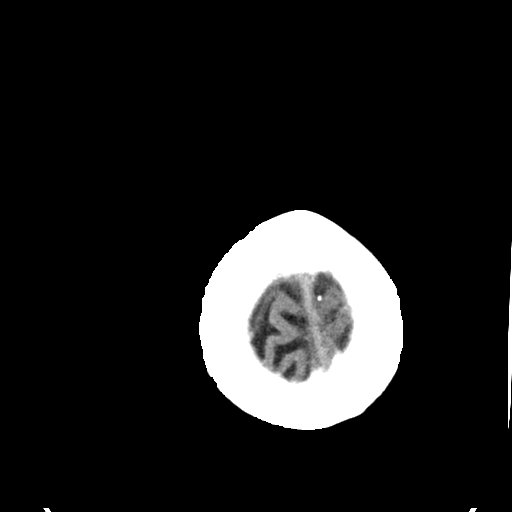

[Series 4: head 2.0 bone · axial · 0.45mm/px · z∈[+1092,+1188]mm · 6 of 80 slices shown]
[im 8/80  bone]
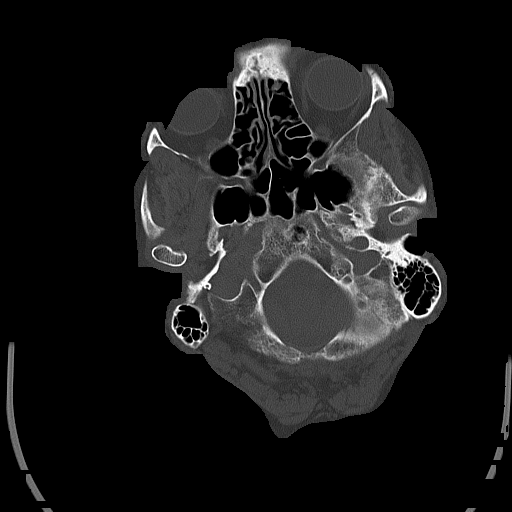
[im 16/80  bone]
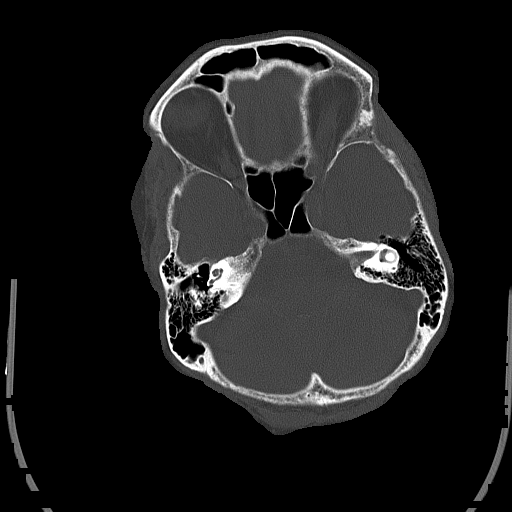
[im 24/80  bone]
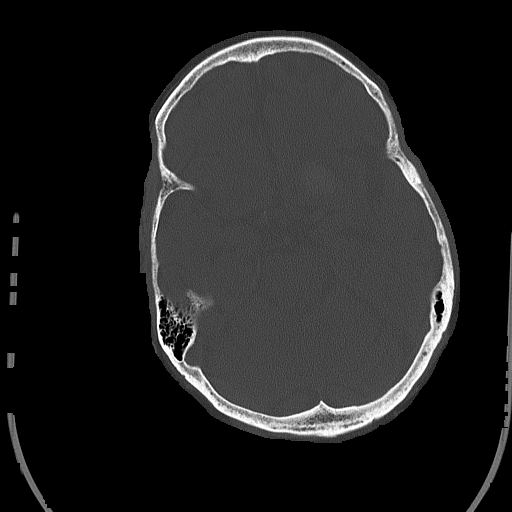
[im 36/80  bone]
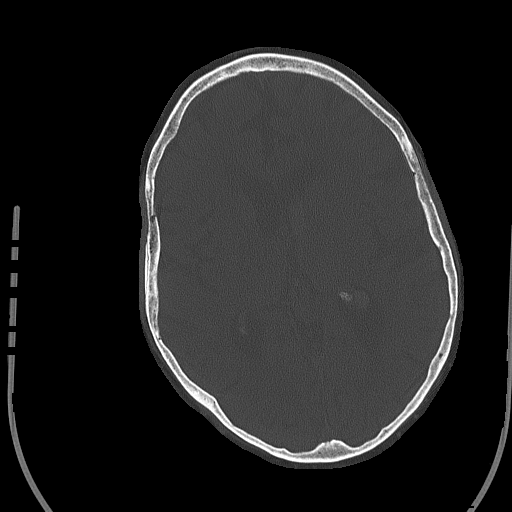
[im 44/80  bone]
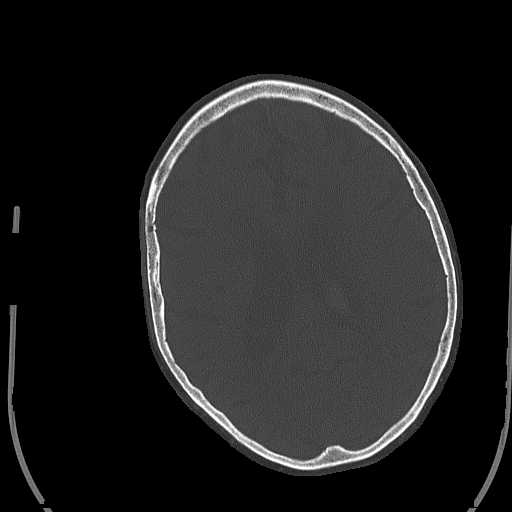
[im 56/80  bone]
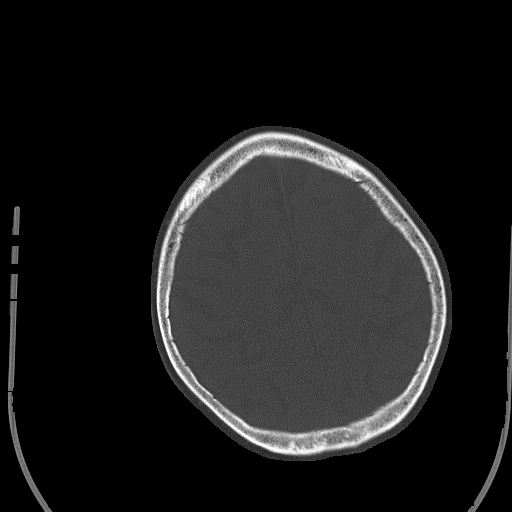

[Series 6: head 3.0 sag st · sagittal · 0.32mm/px · 3 of 53 slices shown]
[im 18/53  brain]
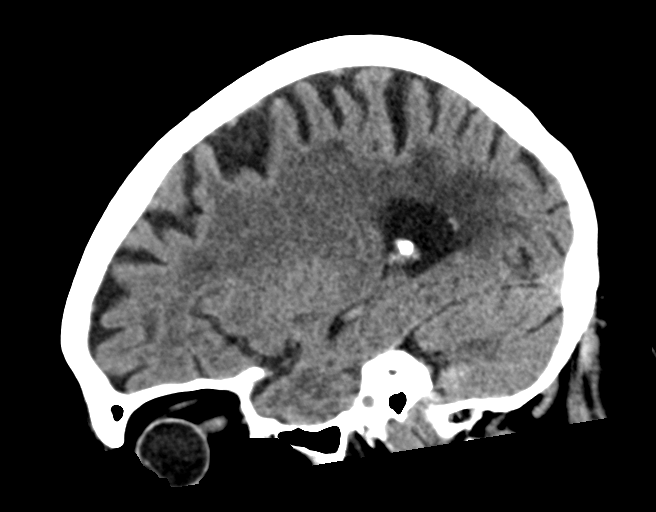
[im 27/53  brain]
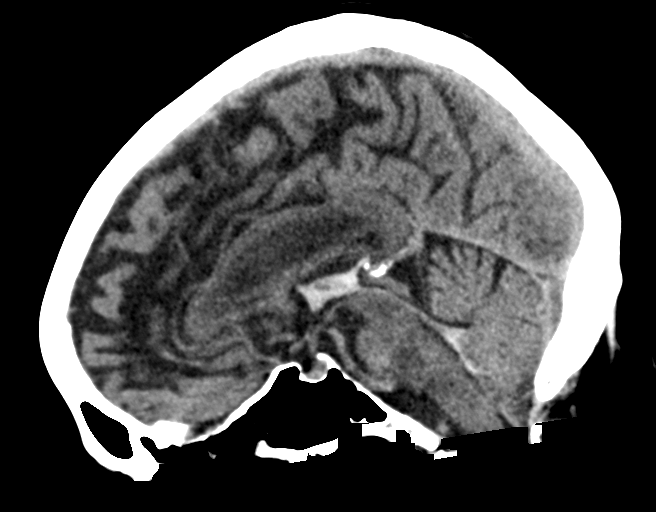
[im 35/53  brain]
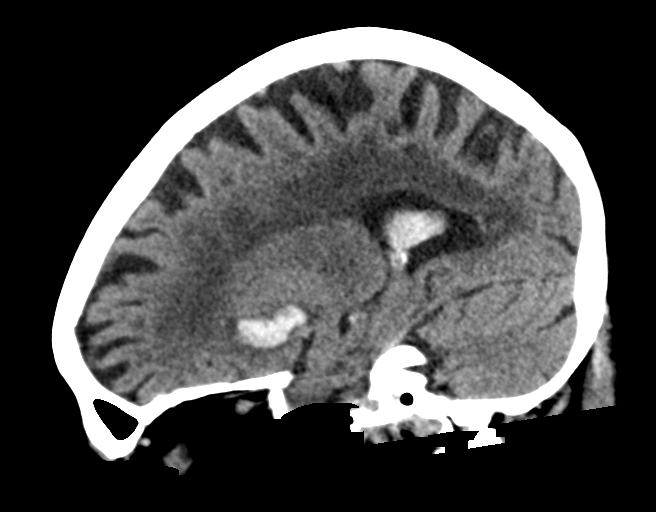

[16 of 37 positions shown; findings below may reference images not displayed]

FINDINGS: Brain: High density material, likely hemorrhage, centered in the
left basal ganglia, extending into the left lateral ventricle, third
ventricle, and fourth ventricle. No hydrocephalus. The parenchymal
portion hemorrhage measures up to 2.8 x 2.0 x 1.4 cm (series 3,
image 11 and series 5, image 30). There is mild surrounding
hypodensity, likely edema. No significant mass effect or midline
shift. No extra-axial collection. Periventricular white matter
changes, likely the sequela of chronic small vessel ischemic
disease. No acute infarct.

Vascular: No hyperdense vessel or unexpected calcification.

Skull: Normal. Negative for fracture or focal lesion.

Sinuses/Orbits: No acute finding.

Other: The mastoid air cells are well aerated.
IMPRESSION: 1. Intraparenchymal hemorrhage, centered in the left basal ganglia,
with mild surrounding edema but without significant mass effect or
midline shift.
2. Intraventricular extension into the left lateral ventricle, third
ventricle, and fourth ventricle. No hydrocephalus.

Code stroke imaging results were communicated on [DATE] at [DATE]
to provider Dr. AMOZA via secure text paging.

## 2021-06-04 MED ORDER — CLEVIDIPINE BUTYRATE 0.5 MG/ML IV EMUL: 0.0000 mg/h | INTRAVENOUS | Status: DC

## 2021-06-04 MED ORDER — PANTOPRAZOLE SODIUM 40 MG IV SOLR
40.0000 mg | Freq: Every day | INTRAVENOUS | Status: DC
  Administered 2021-06-04: 40 mg via INTRAVENOUS
  Filled 2021-06-04: qty 40

## 2021-06-04 MED ORDER — SODIUM CHLORIDE 0.9% FLUSH: 3.0000 mL | Freq: Once | INTRAVENOUS | Status: DC

## 2021-06-04 MED ORDER — CHLORHEXIDINE GLUCONATE CLOTH 2 % EX PADS
6.0000 | MEDICATED_PAD | Freq: Every day | CUTANEOUS | Status: DC
  Administered 2021-06-04 – 2021-06-07 (×4): 6 via TOPICAL

## 2021-06-04 MED ORDER — ACETAMINOPHEN 325 MG PO TABS: 650.0000 mg | ORAL_TABLET | ORAL | Status: DC | PRN

## 2021-06-04 MED ORDER — ACETAMINOPHEN 650 MG RE SUPP: 650.0000 mg | RECTAL | Status: DC | PRN

## 2021-06-04 MED ORDER — LAMOTRIGINE 25 MG PO TABS: 25.0000 mg | ORAL_TABLET | Freq: Every day | ORAL | Status: DC

## 2021-06-04 MED ORDER — STROKE: EARLY STAGES OF RECOVERY BOOK
Freq: Once | Status: AC
  Filled 2021-06-04: qty 1

## 2021-06-04 MED ORDER — SENNOSIDES-DOCUSATE SODIUM 8.6-50 MG PO TABS
1.0000 | ORAL_TABLET | Freq: Two times a day (BID) | ORAL | Status: DC
  Administered 2021-06-04 – 2021-06-07 (×6): 1 via ORAL
  Filled 2021-06-04 (×6): qty 1

## 2021-06-04 MED ORDER — DULOXETINE HCL 60 MG PO CPEP
60.0000 mg | ORAL_CAPSULE | Freq: Every day | ORAL | Status: DC
  Administered 2021-06-04 – 2021-06-07 (×4): 60 mg via ORAL
  Filled 2021-06-04 (×4): qty 1

## 2021-06-04 MED ORDER — LAMOTRIGINE 25 MG PO TABS
25.0000 mg | ORAL_TABLET | Freq: Two times a day (BID) | ORAL | Status: DC
  Administered 2021-06-04 – 2021-06-07 (×6): 25 mg via ORAL
  Filled 2021-06-04 (×8): qty 1

## 2021-06-04 MED ORDER — ALPRAZOLAM 0.25 MG PO TABS
0.2500 mg | ORAL_TABLET | Freq: Every evening | ORAL | Status: DC | PRN
  Administered 2021-06-05 – 2021-06-06 (×2): 0.25 mg via ORAL
  Filled 2021-06-04 (×2): qty 1

## 2021-06-04 MED ORDER — ACETAMINOPHEN 160 MG/5ML PO SOLN: 650.0000 mg | ORAL | Status: DC | PRN

## 2021-06-04 MED ORDER — PREGABALIN 25 MG PO CAPS
25.0000 mg | ORAL_CAPSULE | Freq: Two times a day (BID) | ORAL | Status: DC
  Administered 2021-06-04 – 2021-06-07 (×6): 25 mg via ORAL
  Filled 2021-06-04 (×7): qty 1

## 2021-06-04 MED ORDER — LABETALOL HCL 5 MG/ML IV SOLN
10.0000 mg | Freq: Once | INTRAVENOUS | Status: AC
  Administered 2021-06-04: 10 mg via INTRAVENOUS

## 2021-06-04 NOTE — Plan of Care (Signed)
Clarification on blood pressure parameters-systolic blood pressure goal 100-140 for the first day.  Can liberalize the higher limit tomorrow. Changes made in the chart to the Cleviprex orders as well as nursing orders.

## 2021-06-04 NOTE — H&P (Signed)
Admission H&P    Chief Complaint: Acute onset of speech deficit  HPI: Shelby Hill is an 86 y.o. female with a PMHx of basal cell carcinoma, squamous cell carcinoma, trigeminal neuralgia and HLD who presents to the ED via EMS with acute onset of right facial droop, garbled speech and confusion. LKN was 1:30 PM while getting a facial at a salon. Staff then noticed patient to be acutely confused and answering questions inappropriately. Technician thought that she was talking in her sleep during the facial; after the facial was done, she contacted the patient's husband. EMS was called and on arrival they noted slurred speech and confusion. Code Stroke was called en route to the hospital. The patient is not on any blood thinners.   Past Medical History:  Diagnosis Date   BCC (basal cell carcinoma) 02/09/1988   left upper thigh,left lower thigh tx cx3 [id number]   BCC (basal cell carcinoma) 10/04/1962   left clavicle,left breast, Back   BCC (basal cell carcinoma) 03/06/1989   mid back   BCC (basal cell carcinoma) 04/02/1990   left upper shoulder (CX35FU),left upper breast(CX35FU),above left clavicle, right outer back,   BCC (basal cell carcinoma) 04/01/1991   right cetner outer brown (CX3+exc. ), upper left chest,upper left chest medial, right back   BCC (basal cell carcinoma) 02/01/1994   right back   BCC (basal cell carcinoma) 03/26/1995   central upper back (CX35FU),left upper back (CX35FU)   BCC (basal cell carcinoma) 12/30/1997   right cheek (exc. )   BCC (basal cell carcinoma) 08/06/1999   upper right shin (CX35FU), upper right shin (CX35FU)   BCC (basal cell carcinoma) 08/24/1998   left breast (CX35FU)   BCC (basal cell carcinoma) 08/05/2001   right inner cheek (MOHS), Left upper back (CX35FU),right upper back (CX35FU),, right top hand (CX35FU)   BCC (basal cell carcinoma) 08/02/2002   upper back(CX35FU)   BCC (basal cell carcinoma) 09/12/2003   right mid back (CX35FU), right thigh (deep  freeze+aldara) Left thigh outer (deep freeze +aldara)   BCC (basal cell carcinoma) 06/03/2005   rigth scapula (CX35FU),    BCC (basal cell carcinoma) 06/05/2006   right ant. 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 (CX35FU)   SCC (squamous cell carcinoma) 06/05/2006   mid chest (CX35FU)   SCC (squamous cell carcinoma) 08/08/2010   left temple (CX35FU), Left upper arm (CX35FU), right forearm/wrist (CX35FU)   SCC (squamous cell carcinoma) 02/06/2012   Left post neck-tx p bx   SCC (squamous cell carcinoma) 11/09/2012   left hand-tx p bx-, left chest sup -tx p bx, left chest inf. (CX35FU)   SCC (squamous cell carcinoma) 08/04/2014   left clavicle-tx p bx   SCC (squamous cell carcinoma) 11/30/2014   lower right leg distal (CX35FU), lower right leg, prox. (CX35FU)   SCC (squamous cell carcinoma) 05/17/2015   right jawline (CX35FU), Left sideburn (CX35FU)   SCC (squamous  cell carcinoma) 09/10/2017   left forearm-tx p bx, left temple -tx p bx    Past Surgical History:  Procedure Laterality Date   COLONOSCOPY  01/01/2012   Procedure: COLONOSCOPY;  Surgeon: Rogene Houston, MD;  Location: [facility];  Service: Endoscopy;  Laterality: N/A;  830   INTRAMEDULLARY (IM) NAIL INTERTROCHANTERIC Left 04/26/2017   Procedure: INTRAMEDULLARY (IM) NAIL INTERTROCHANTRIC;  Surgeon: Leandrew Koyanagi, MD;  Location: [facility];  Service: Orthopedics;  Laterality: Left;    KNEE SURGERY     ORIF TIBIA PLATEAU      Family History  Problem Relation Age of Onset   CAD Mother    Stroke Sister    Stroke Brother    Social History:  reports that she has never smoked. She has never used smokeless tobacco. She reports current alcohol use. She reports that she does not use drugs.  Allergies:  Allergies  Allergen Reactions   Codeine Nausea And Vomiting   Home Medications: No current facility-administered medications on file prior to encounter.   Current Outpatient Medications on File Prior to Encounter  Medication Sig Dispense Refill   ALPRAZolam (XANAX) 0.5 MG tablet Take 0.5 tablets (0.25 mg total) by mouth at bedtime as needed for anxiety or sleep. 3 tablet 0   DULoxetine (CYMBALTA) 60 MG capsule TAKE 1 CAPSULE BY MOUTH DAILY (Patient taking differently: 60 mg daily.) 30 capsule 5   lamoTRIgine (LAMICTAL) 25 MG tablet Take 25 mg twice daily 60 tablet 5   pregabalin (LYRICA) 25 MG capsule Take 1 capsule (25 mg total) by mouth 2 (two) times daily. 180 capsule 1     ROS: As per HPI. Unable to obtain a more comprehensive ROS due to dysphasia.   Physical Examination: There were no vitals taken for this visit.  HEENT-  P[place]/AT  Lungs - Respirations unlabored Extremities - Warm and well perfused  Neurologic Examination: Ment: Awake and alert. Exhibits an expressive speech deficit that worsens with open ended questions requiring replies with longer and more complex sentence structures. Intact comprehension and naming but impaired repetition. Oriented to the city, state, year, month and day of the week.  CN: PERRL. Temporal visual fields intact. Temp sensation equal bilaterally. Right facial droop. Phonation intact with mild/subtle dysarthria. Head is midline.  Motor:  RUE: 4/5 right hand grip and deltoid, otherwise 5/5 RLE: 4+/5 proximally and distally LUE 5/5 LLE: 5/5 Sensory: Intact to FT and temp x 4. No extinction to DSS.  Reflexes: 2+ bilateral  brachioradialis and biceps. 4+ right patellar, 3+ left patellar. 2+ bilateral achilles. Toes equivocal bilaterally.  Cerebellar: No ataxia with FNF bilaterally Gait: Deferred due to falls risk concerns.   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  1. Exam reveals expressive speech deficit that worsens with open ended questions requiring replies with longer and more complex sentence structures. Intact comprehension and naming but impaired repetition. Also with mild right sided motor deficits and right facial droop.  2. CT head: Intraparenchymal hemorrhage, centered in the left basal ganglia, with mild surrounding edema but without significant mass effect or midline shift. Intraventricular extension into the left lateral ventricle, third ventricle, and fourth ventricle. No hydrocephalus. 3. Etiology for her ICH is most likely severe HTN. Also on the DDx is amyloid angiopathy. Lower on DDx is underlying vascular anomaly, tumor or ischemic stroke with gross hemorrhagic conversion. No recent history of head trauma.  4. She is not on any anticoagulation at  home.   Plan: 1. Admit to ICU under Neurology service 2. MRI/MRA of head 3. Carotid ultrasound 4. TTE 5. PT consult, OT consult, Speech consult 6. Cardiac telemetry 7. Frequent neuro checks 8. Repeat CT head at 5 AM.  9. Labetalol has brought her BP down. Goal SBP is 130-150. Will start clevidipine drip if SBP increases to above 150. 10. No antiplatelet medications or anticoagulants. 11. DVT prophylaxis with SCDs.    Electronically signed: Dr. Kerney Elbe 06/04/2021, 5:11 PM

## 2021-06-04 NOTE — Code Documentation (Signed)
Stroke Response Nurse Documentation Code Documentation  Shelby Hill is a 86 y.o. female arriving to [facility] ED via [facility] on 06-04-2021 with past medical hx of CA, trigeminal neuralgia and tremor . On No antithrombotic. Code stroke was activated by EMS.   Patient from home where she was LKW at 1330 and now complaining of aphasia . Patient was her normal self when she arrived at her appointment for a facial.  During the appointment staff noted that she seems confused and was speaking oddly.    Stroke team at the bedside on patient arrival. Labs drawn and patient cleared for CT by EDP. Patient to CT with team. NIHSS 2, see documentation for details and code stroke times. Patient with right facial droop and Expressive aphasia  on exam. The following imaging was completed:  CT. Patient is not a candidate for IV Thrombolytic due to Intraparenchymal hemorrhage  Patient is not a candidate for IR due to no LVO.   Initial BP 171/86  HR 87  10 mg Labetalol given IV BP 105/91-122/85  HR 88  Care/Plan: neuro check and VS q 1 hour.   Bedside handoff with ED RN Raquel Sarna.    Raliegh Ip  Stroke Response RN

## 2021-06-04 NOTE — ED Provider Notes (Signed)
[facility] EMERGENCY DEPARTMENT Provider Note   CSN: [id number] Arrival date & time: 06/04/21  1658  An emergency department physician performed an initial assessment on this suspected stroke patient at 69.  History  Chief Complaint  Patient presents with   Code Stroke    Shelby Hill is a 86 y.o. female with past medical history significant for trigeminal neuralgia, hyperlipidemia who presents as a code stroke.  The patient was reportedly receiving a facial when the technician thought she was confused and answering questions inappropriately.  EMS was called and the patient was brought to [facility] ED as a code stroke.  According to the patient's husband and daughter at bedside, she was in her usual state of health this morning.     Home Medications Prior to Admission medications   Medication Sig Start Date End Date Taking? Authorizing Provider  acetaminophen (TYLENOL) 500 MG tablet Take 500 mg by mouth every 6 (six) hours as needed.    [provider]  ALPRAZolam Duanne Moron) 0.5 MG tablet Take 0.5 tablets (0.25 mg total) by mouth at bedtime as needed for anxiety or sleep. 04/29/17   Patrecia Pour, MD  aspirin EC 81 MG tablet Take 81 mg by mouth daily. Swallow whole.    [provider]  calcitonin, salmon, (MIACALCIN/FORTICAL) 200 UNIT/ACT nasal spray USE ONE SPRAY IN ONE NOSTRIL ONCE DAILY, ALTERNATING NOSTRILS 01/20/19   [provider]  DULoxetine (CYMBALTA) 60 MG capsule TAKE 1 CAPSULE BY MOUTH DAILY 03/12/21   Suzzanne Cloud, NP  lamoTRIgine (LAMICTAL) 25 MG tablet Take 25 mg twice daily 09/25/20   Suzzanne Cloud, NP  pregabalin (LYRICA) 25 MG capsule Take 1 capsule (25 mg total) by mouth 2 (two) times daily. 02/20/21   Kathrynn Ducking, MD  rosuvastatin (CRESTOR) 5 MG tablet Take 5 mg by mouth at bedtime. 02/25/19   [provider]      Allergies    Codeine    Review of Systems   Review of Systems  Constitutional:  Negative  for chills and fever.  HENT:  Negative for congestion.   Respiratory:  Negative for cough and shortness of breath.   Cardiovascular:  Negative for chest pain.  Gastrointestinal:  Negative for abdominal pain, constipation, diarrhea, nausea and vomiting.  Neurological:  Positive for speech difficulty. Negative for facial asymmetry, weakness and numbness.   Physical Exam Updated Vital Signs BP (!) 105/91 (BP Location: Right Arm)    Pulse 88    Temp 98 F (36.7 C) (Oral)    Resp (!) 22    Ht 5\' 6"  (1.676 m)    Wt 69.7 kg    SpO2 100%    BMI 24.81 kg/m  Physical Exam Vitals and nursing note reviewed.  Constitutional:      General: She is not in acute distress.    Appearance: She is well-developed.  HENT:     Head: Normocephalic and atraumatic.     Right Ear: External ear normal.     Left Ear: External ear normal.     Nose: Nose normal.     Mouth/Throat:     Pharynx: Oropharynx is clear.  Eyes:     Extraocular Movements: Extraocular movements intact.     Conjunctiva/sclera: Conjunctivae normal.     Pupils: Pupils are equal, round, and reactive to light.  Cardiovascular:     Rate and Rhythm: Normal rate and regular rhythm.     Pulses: Normal pulses.  Heart sounds: Normal heart sounds. No murmur heard. Pulmonary:     Effort: Pulmonary effort is normal. No respiratory distress.     Breath sounds: Normal breath sounds.  Abdominal:     Palpations: Abdomen is soft.     Tenderness: There is no abdominal tenderness.  Musculoskeletal:        General: No swelling.     Cervical back: Neck supple.  Skin:    General: Skin is warm and dry.     Capillary Refill: Capillary refill takes less than 2 seconds.  Neurological:     Mental Status: She is alert.     GCS: GCS eye subscore is 4. GCS verbal subscore is 4. GCS motor subscore is 6.     Cranial Nerves: Cranial nerves 2-12 are intact.     Sensory: Sensation is intact.     Motor: Motor function is intact.     Coordination: Coordination  is intact.     Comments: Mild expressive aphasia.  Patient calls her daughter her wife and was unable to name the "band" of my watch.  She is able to understand and follow commands.  Psychiatric:        Mood and Affect: Mood normal.    ED Results / Procedures / Treatments   Labs (all labs ordered are listed, but only abnormal results are displayed) Labs Reviewed  COMPREHENSIVE METABOLIC PANEL - Abnormal; Notable for the following components:      Result Value   Calcium 8.6 (*)    Total Protein 6.3 (*)    All other components within normal limits  PROTIME-INR  APTT  CBC  DIFFERENTIAL  I-STAT CHEM 8, ED    EKG None  Radiology CT HEAD CODE STROKE WO CONTRAST  Result Date: 06/04/2021 CLINICAL DATA:  Code stroke. EXAM: CT HEAD WITHOUT CONTRAST TECHNIQUE: Contiguous axial images were obtained from the base of the skull through the vertex without intravenous contrast. COMPARISON:  No prior head CT, correlation is made with MRI brain 01/13/2021. FINDINGS: Brain: High density material, likely hemorrhage, centered in the left basal ganglia, extending into the left lateral ventricle, third ventricle, and fourth ventricle. No hydrocephalus. The parenchymal portion hemorrhage measures up to 2.8 x 2.0 x 1.4 cm (series 3, image 11 and series 5, image 30). There is mild surrounding hypodensity, likely edema. No significant mass effect or midline shift. No extra-axial collection. Periventricular white matter changes, likely the sequela of chronic small vessel ischemic disease. No acute infarct. Vascular: No hyperdense vessel or unexpected calcification. Skull: Normal. Negative for fracture or focal lesion. Sinuses/Orbits: No acute finding. Other: The mastoid air cells are well aerated. IMPRESSION: 1. Intraparenchymal hemorrhage, centered in the left basal ganglia, with mild surrounding edema but without significant mass effect or midline shift. 2. Intraventricular extension into the left lateral ventricle,  third ventricle, and fourth ventricle. No hydrocephalus. Code stroke imaging results were communicated on 06/04/2021 at 5:12 pm to provider Dr. Cheral Marker via secure text paging. Electronically Signed   By: Merilyn Baba M.D.   On: 06/04/2021 17:15    Procedures Procedures   Medications Ordered in ED Medications  sodium chloride flush (NS) 0.9 % injection 3 mL (0 mLs Intravenous Hold 06/04/21 1750)  labetalol (NORMODYNE) injection 10 mg (10 mg Intravenous Given 06/04/21 1712)    And  clevidipine (CLEVIPREX) infusion 0.5 mg/mL (0 mg/hr Intravenous Hold 06/04/21 1750)    ED Course/ Medical Decision Making/ A&P  Patient presents with confusion and word finding difficulty as described in HPI above.  CT head significant for left basal ganglia IPH with mild surrounding edema but no significant mass-effect or midline shift.  There is intraventricular extension to the left lateral ventricle, third ventricle, and fourth ventricle without hydrocephalus.  Discussed the patient with her daughter and husband at bedside.  They state that she was in her usual state of health this morning and seems to be having trouble naming objects and finding words which is different from her baseline.  She was given labetalol by neurology with improvement in her blood pressure from systolic 184C to the low 375O. I discussed the patient with neurology will admit the patient to the ICU on their service.    Final Clinical Impression(s) / ED Diagnoses Final diagnoses:  Intraparenchymal hemorrhage of brain [facility])    Rx / DC Orders ED Discharge Orders     None         Johara Lodwick, Amalia Hailey, MD 06/04/21 1941    Elnora Morrison, MD 06/08/21 1251

## 2021-06-04 NOTE — ED Triage Notes (Signed)
Pt BIBA from receiving a facial. Technician thought that was talking in her sleep during the facial. After she was done, she contacted pts husband. Pt is able to answer orientation questions properly, but is aphasic with additional questions.

## 2021-06-05 ENCOUNTER — Inpatient Hospital Stay (HOSPITAL_COMMUNITY): Payer: Medicare PPO

## 2021-06-05 DIAGNOSIS — G500 Trigeminal neuralgia: Secondary | ICD-10-CM

## 2021-06-05 DIAGNOSIS — I6389 Other cerebral infarction: Secondary | ICD-10-CM

## 2021-06-05 DIAGNOSIS — I160 Hypertensive urgency: Secondary | ICD-10-CM

## 2021-06-05 DIAGNOSIS — I615 Nontraumatic intracerebral hemorrhage, intraventricular: Secondary | ICD-10-CM

## 2021-06-05 DIAGNOSIS — I610 Nontraumatic intracerebral hemorrhage in hemisphere, subcortical: Secondary | ICD-10-CM

## 2021-06-05 LAB — ECHOCARDIOGRAM COMPLETE
AR max vel: 1.65 cm2
AV Area VTI: 1.71 cm2
AV Area mean vel: 1.64 cm2
AV Mean grad: 5 mmHg
AV Peak grad: 9.7 mmHg
Ao pk vel: 1.56 m/s
Area-P 1/2: 4.17 cm2
Height: 66 in
S' Lateral: 3.15 cm
Weight: 2761.92 oz

## 2021-06-05 LAB — MRSA NEXT GEN BY PCR, NASAL: MRSA by PCR Next Gen: NOT DETECTED

## 2021-06-05 IMAGING — MR MR HEAD W/O CM
7 of 8 series · 36 of 48 positions shown · non-contrast
Comparison: Prior CT from earlier the same day.

CLINICAL DATA: Follow-up examination for stroke.

EXAM:
MRI HEAD WITHOUT CONTRAST
MRA HEAD WITHOUT CONTRAST
TECHNIQUE: Multiplanar, multi-echo pulse sequences of the brain and surrounding
structures were acquired without intravenous contrast. Angiographic
images of the Circle of Willis were acquired using MRA technique
without intravenous contrast.

[Series 4: DWI · axial · 3.0mm · 1.09mm/px · z∈[-32,+118]mm · 8 of 102 slices shown (1 of 4)]
[im 1/102]
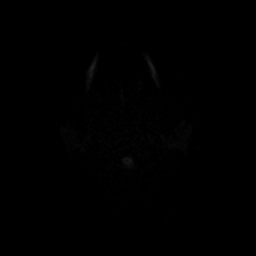
[im 16/102]
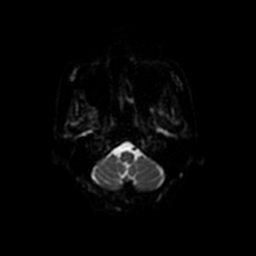
[im 32/102]
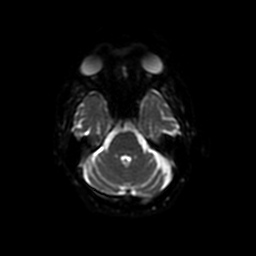
[im 47/102]
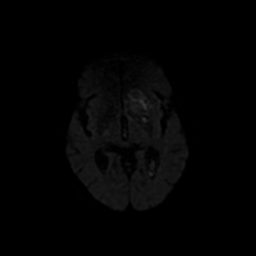
[im 55/102]
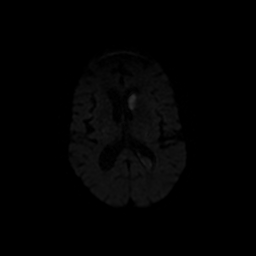
[im 70/102]
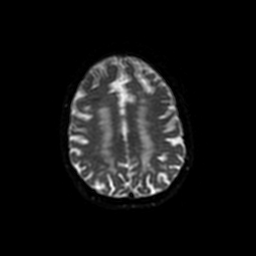
[im 86/102]
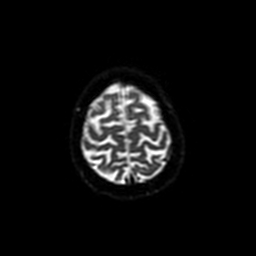
[im 102/102]
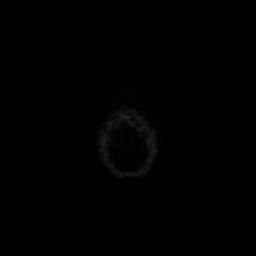

[Series 5: DWI · coronal · 5.0mm · 1.09mm/px · 8 of 78 slices shown (2 of 4)]
[im 1/78]
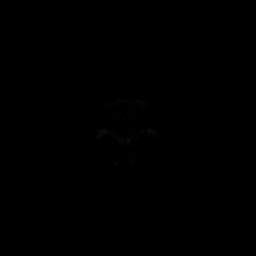
[im 9/78]
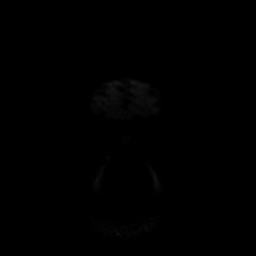
[im 26/78]
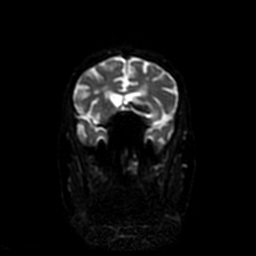
[im 35/78]
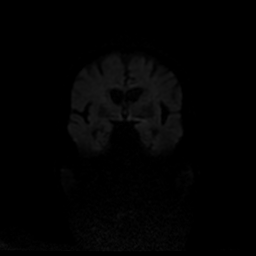
[im 43/78]
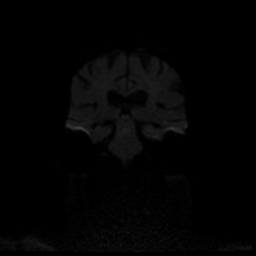
[im 52/78]
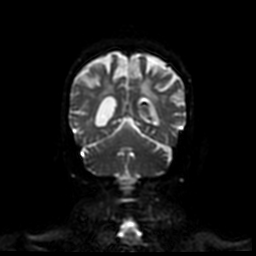
[im 69/78]
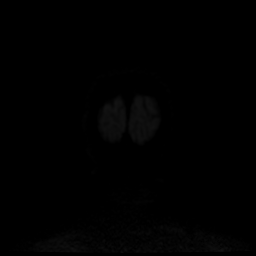
[im 78/78]
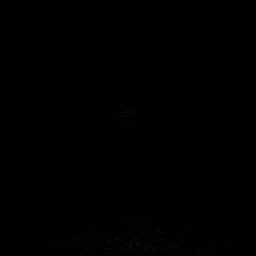

[Series 6: T1 · sagittal · 5.0mm · 0.47mm/px · 2 of 22 slices shown]
[im 1/22]
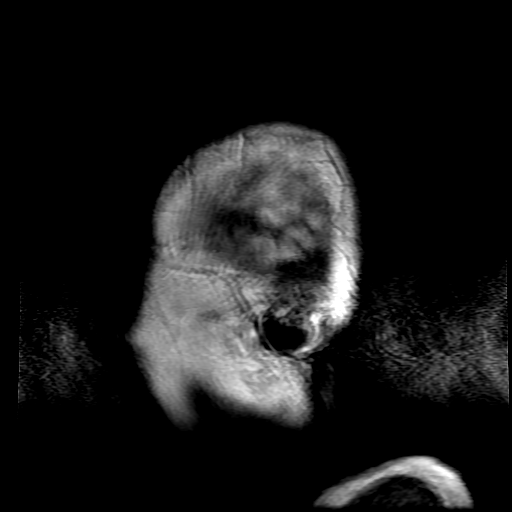
[im 11/22]
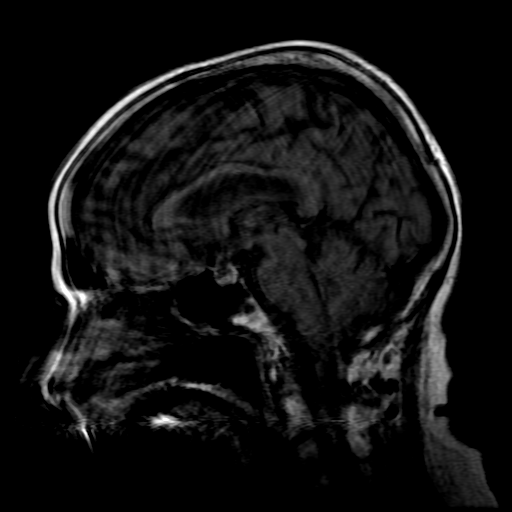

[Series 7: T2 · axial · 5.0mm · 0.43mm/px · z∈[-35,+103]mm · 3 of 24 slices shown]
[im 1/24]
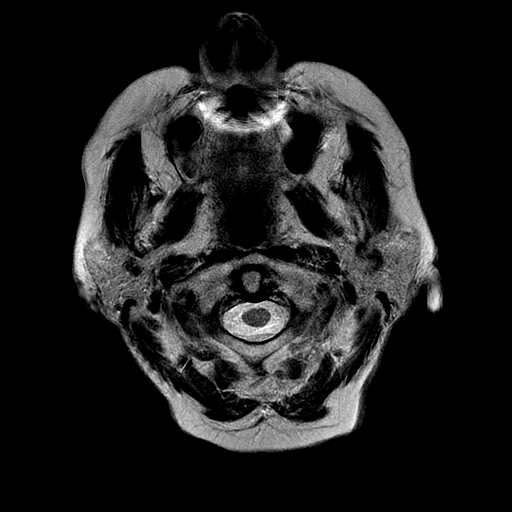
[im 12/24]
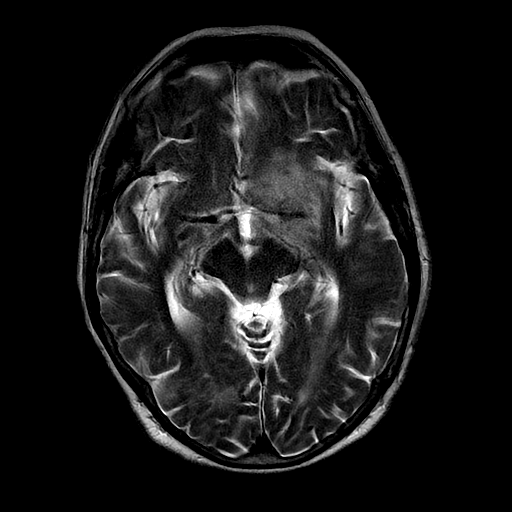
[im 24/24]
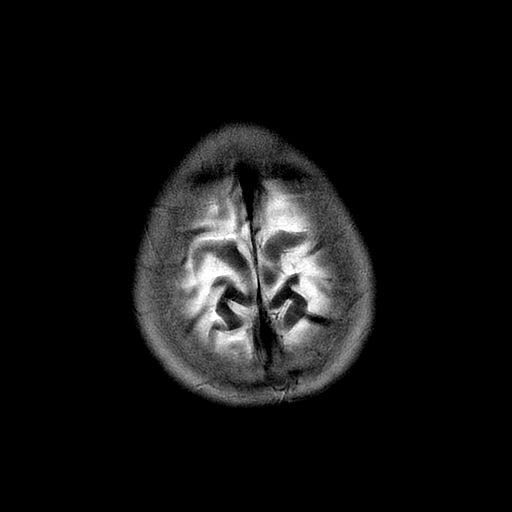

[Series 8: FLAIR · axial · 5.0mm · 0.43mm/px · z∈[-35,+103]mm · 3 of 24 slices shown]
[im 1/24]
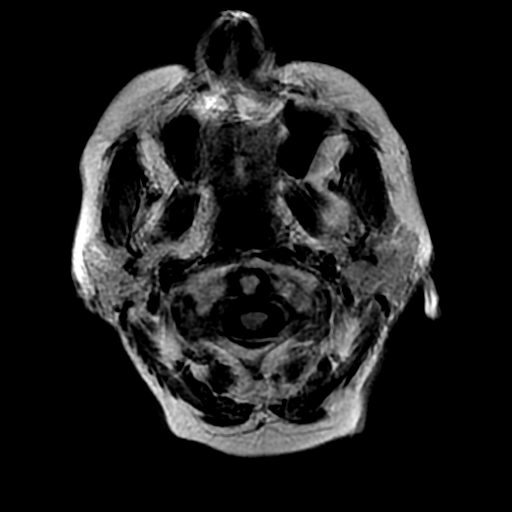
[im 12/24]
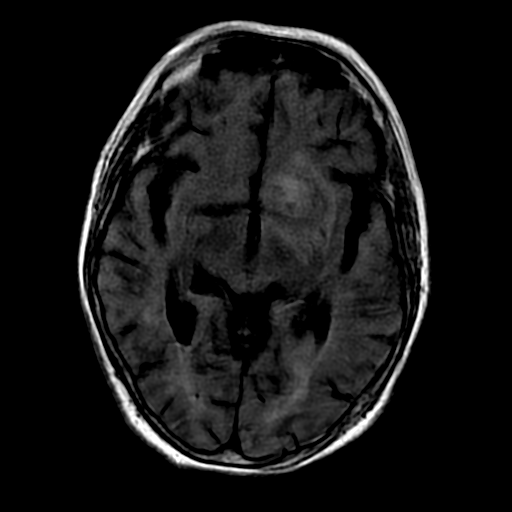
[im 24/24]
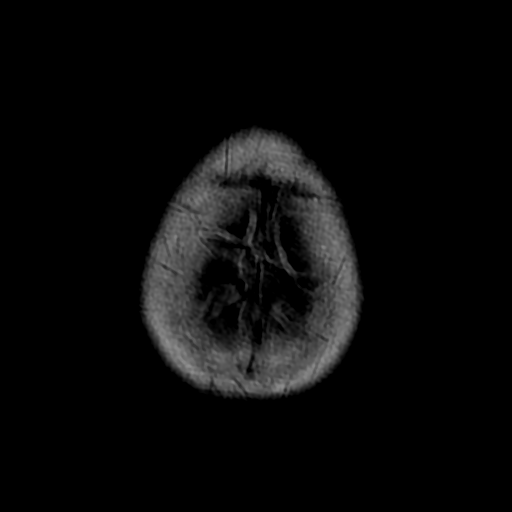

[Series 400: DWI · axial · 3.0mm · 1.09mm/px · z∈[-32,+118]mm · 7 of 51 slices shown (3 of 4)]
[im 1/51]
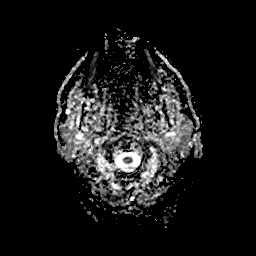
[im 9/51]
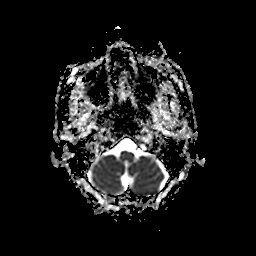
[im 17/51]
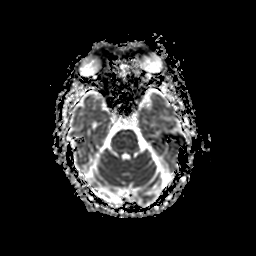
[im 26/51]
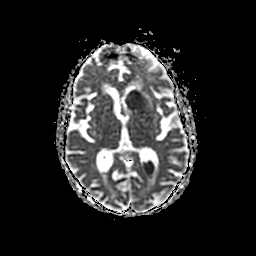
[im 34/51]
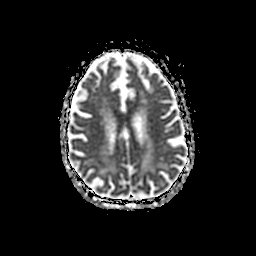
[im 42/51]
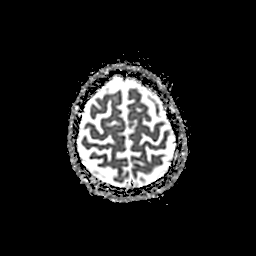
[im 51/51]
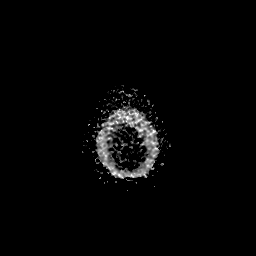

[Series 500: DWI · coronal · 5.0mm · 1.09mm/px · 5 of 39 slices shown (4 of 4)]
[im 1/39]
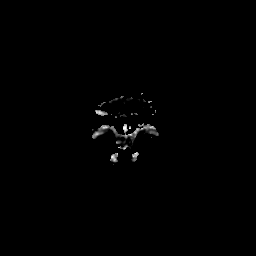
[im 10/39]
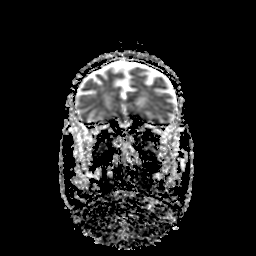
[im 20/39]
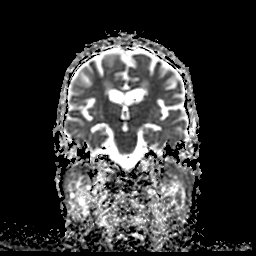
[im 29/39]
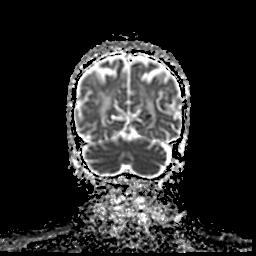
[im 39/39]
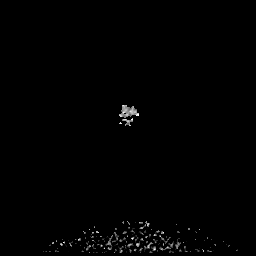

[36 of 48 positions shown; findings below may reference images not displayed]

FINDINGS: MRI HEAD FINDINGS

Brain: Examination moderately to severely degraded by motion
artifact. Additionally, the patient was unable to tolerate the full
length of the exam.

Cerebral volume within normal limits for age. Moderate chronic
microvascular ischemic disease noted involving the periventricular
deep white matter both cerebral hemispheres.

Previously identified intraparenchymal hematoma centered at the
anterior left basal ganglia of is grossly stable in size as compared
to previous. Surrounding vasogenic edema and mild regional mass
effect. Left lateral ventricle is partially effaced without
significant midline shift. No visible underlying lesion on this
motion degraded noncontrast MRI. Intraventricular extension with
blood within the left greater than right lateral ventricles. Stable
ventricular size and morphology without progressive hydrocephalus or
trapping.

No other evidence for acute or subacute infarct. Gray-white matter
differentiation otherwise maintained. No other visible areas of
chronic cortical infarction. No other acute intracranial hemorrhage.
No other significant chronic blood products elsewhere within the
brain.

No visible mass lesion or extra-axial fluid collection. Pituitary
gland suprasellar region grossly normal. Midline structures intact.

Vascular: Major intracranial vascular flow voids are maintained.

Skull and upper cervical spine: Craniocervical junction grossly
normal. Bone marrow signal intensity within normal limits. No scalp
soft tissue abnormality.

Sinuses/Orbits: Patient status post bilateral ocular lens
replacement. Paranasal sinuses are largely clear. No significant
mastoid effusion.

Other: None.

MRA HEAD FINDINGS

Anterior circulation: Examination moderately to severely degraded by
motion artifact. Visualized distal cervical segments of the internal
carotid arteries are patent with antegrade flow. Petrous, cavernous,
and supraclinoid segments patent without significant stenosis or
other visible abnormality. A1 segments patent bilaterally. Normal
anterior communicating artery complex. Anterior cerebral arteries
limited assessment due to motion, but appear to be grossly patent to
their distal aspects on 3D time-of-flight sequence. No visible M1
stenosis or occlusion. Grossly negative MCA bifurcations. Distal MCA
branches grossly perfused and symmetric.

Posterior circulation: Right vertebral artery strongly dominant and
widely patent to the vertebrobasilar junction. Left vertebral artery
hypoplastic and grossly patent as well. Both PICA grossly patent at
their origins. Basilar patent to its distal aspect without stenosis.
Superior cerebellar arteries patent bilaterally. Right PCA supplied
via the basilar. Predominant fetal type origin left PCA. Both PCAs
grossly patent to their distal aspects without stenosis.

Anatomic variants: Fetal type origin of the left PCA. No visible
aneurysm. No visible vascular abnormality seen underlying the left
basal ganglia hemorrhage.
IMPRESSION: MRI HEAD IMPRESSION:

1. Motion degraded exam.
2. Grossly stable appearance of intraparenchymal hemorrhage centered
at the anterior left basal ganglia with surrounding vasogenic edema
and mild regional mass effect. No visible underlying lesion on this
motion degraded noncontrast MRI.
3. Intraventricular extension with blood within the left greater
than right lateral ventricles. Stable ventricular size and
morphology without progressive hydrocephalus or trapping.
4. No other acute intracranial abnormality.

MRA HEAD IMPRESSION:

1. Technically limited exam due to extensive motion artifact.
2. Grossly negative intracranial MRA, with no large vessel
occlusion, hemodynamically significant stenosis, or other acute
vascular abnormality. No visible vascular abnormality seen
underlying the left basal ganglia hemorrhage. No visible aneurysm.

## 2021-06-05 IMAGING — MR MR MRA HEAD W/O CM
1 series · 17 of 48 positions shown · non-contrast
Comparison: Prior CT from earlier the same day.

CLINICAL DATA: Follow-up examination for stroke.

EXAM:
MRI HEAD WITHOUT CONTRAST
MRA HEAD WITHOUT CONTRAST
TECHNIQUE: Multiplanar, multi-echo pulse sequences of the brain and surrounding
structures were acquired without intravenous contrast. Angiographic
images of the Circle of Willis were acquired using MRA technique
without intravenous contrast.

[Series 3: (id) mt fs · axial · 1.4mm · 0.43mm/px · z∈[-40,+50]mm · 17 of 136 slices shown]
[im 1/136]
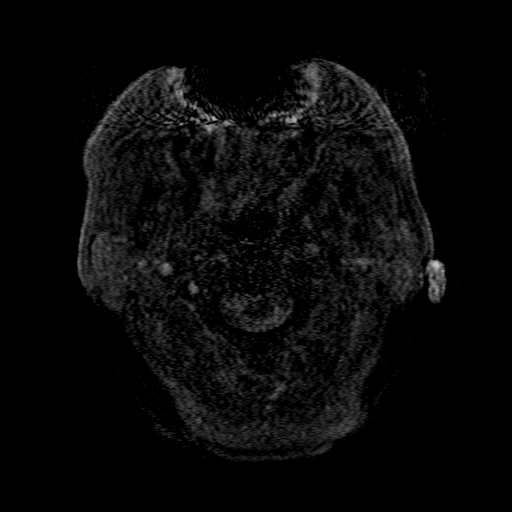
[im 3/136]
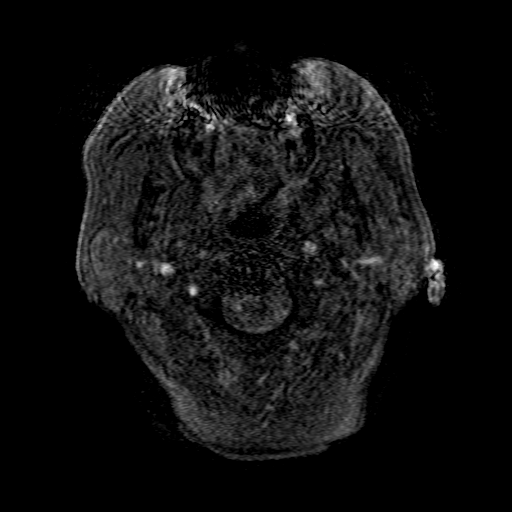
[im 6/136]
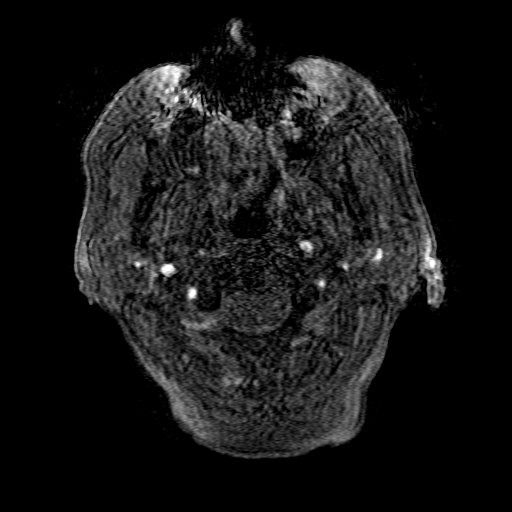
[im 9/136]
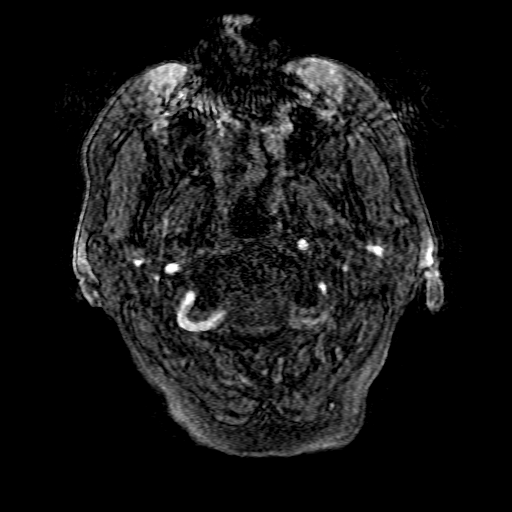
[im 12/136]
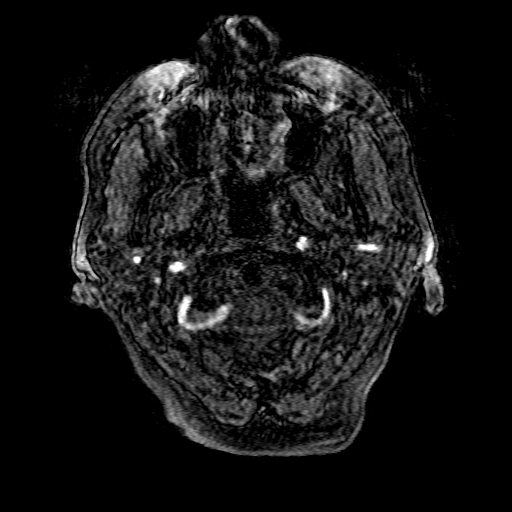
[im 15/136]
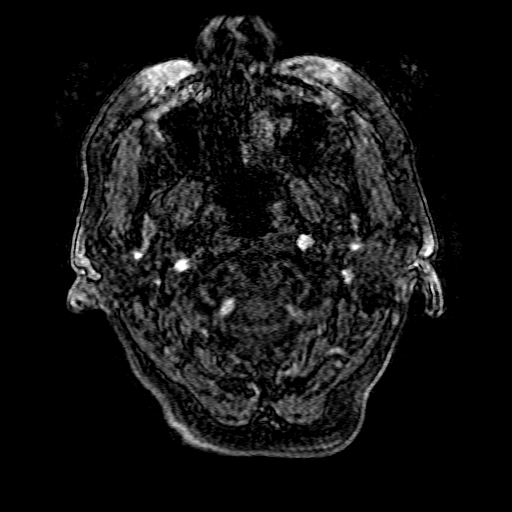
[im 18/136]
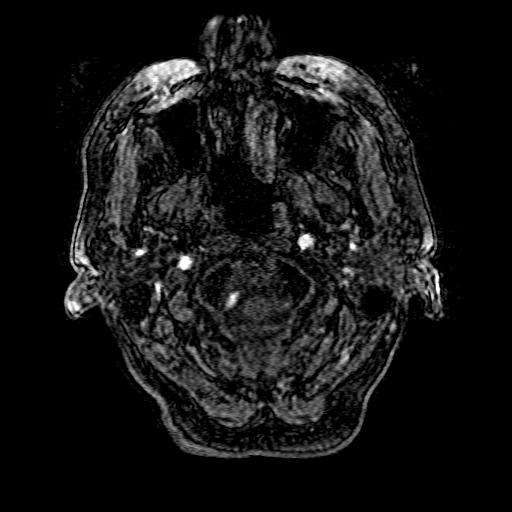
[im 23/136]
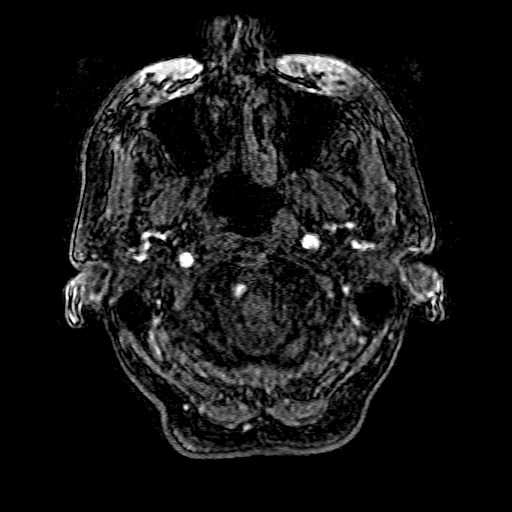
[im 26/136]
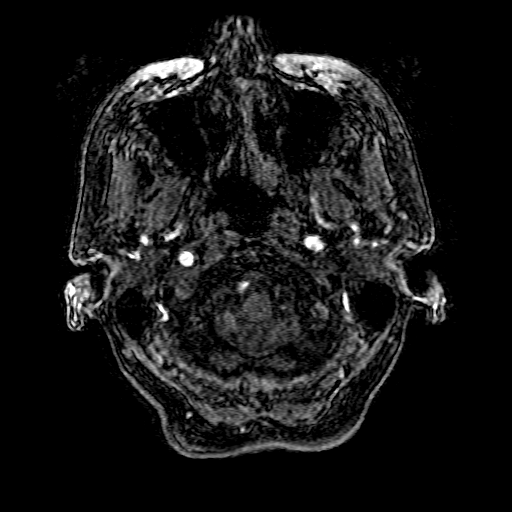
[im 44/136]
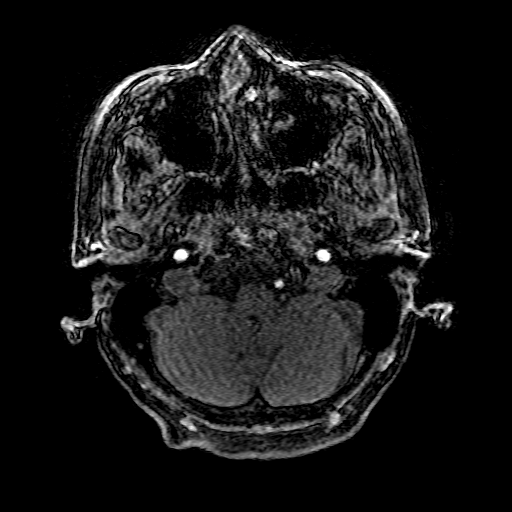
[im 61/136]
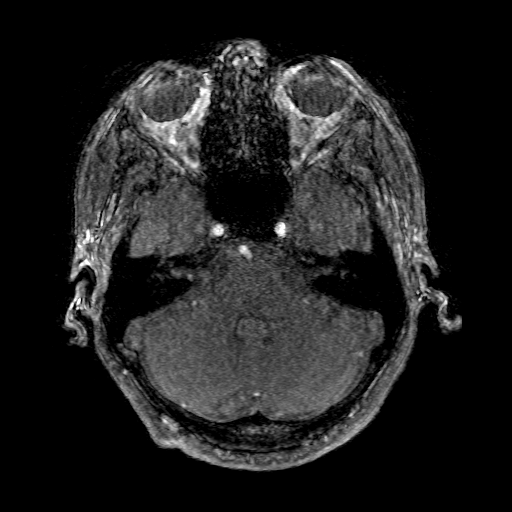
[im 69/136]
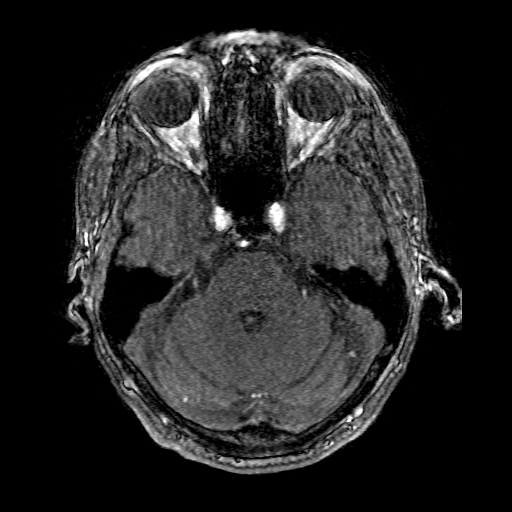
[im 78/136]
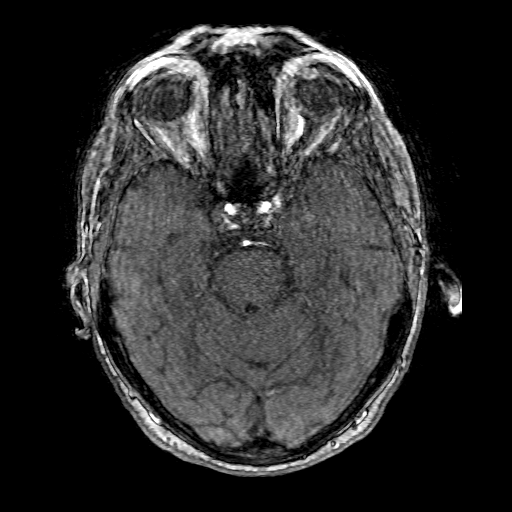
[im 95/136]
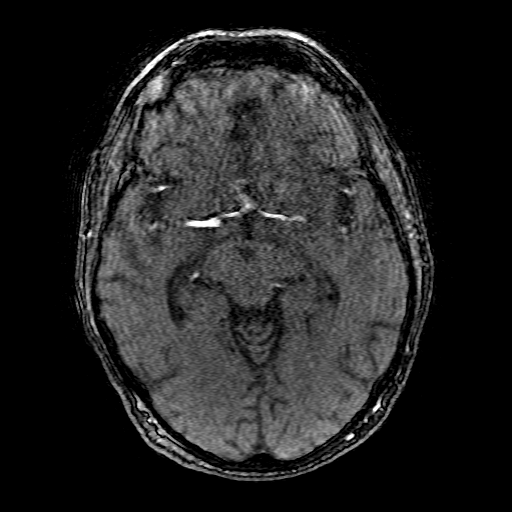
[im 113/136]
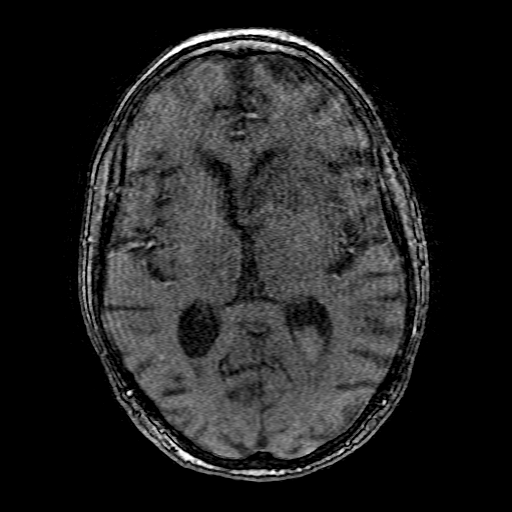
[im 115/136]
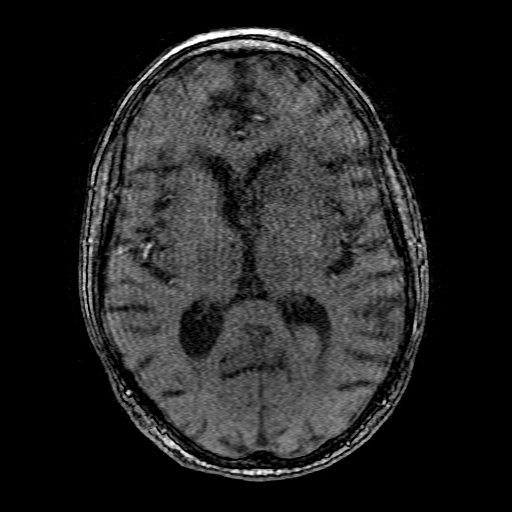
[im 130/136]
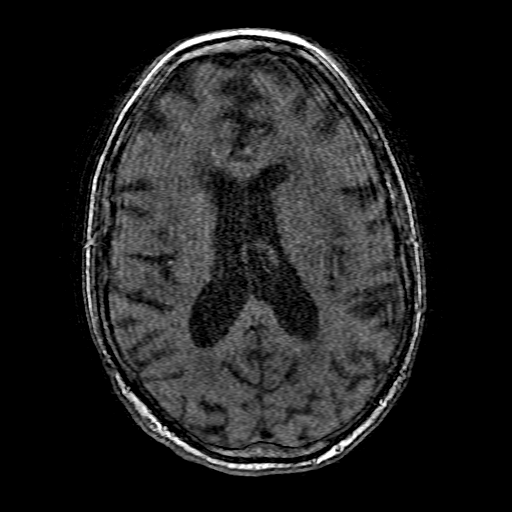

[17 of 48 positions shown; findings below may reference images not displayed]

FINDINGS: MRI HEAD FINDINGS

Brain: Examination moderately to severely degraded by motion
artifact. Additionally, the patient was unable to tolerate the full
length of the exam.

Cerebral volume within normal limits for age. Moderate chronic
microvascular ischemic disease noted involving the periventricular
deep white matter both cerebral hemispheres.

Previously identified intraparenchymal hematoma centered at the
anterior left basal ganglia of is grossly stable in size as compared
to previous. Surrounding vasogenic edema and mild regional mass
effect. Left lateral ventricle is partially effaced without
significant midline shift. No visible underlying lesion on this
motion degraded noncontrast MRI. Intraventricular extension with
blood within the left greater than right lateral ventricles. Stable
ventricular size and morphology without progressive hydrocephalus or
trapping.

No other evidence for acute or subacute infarct. Gray-white matter
differentiation otherwise maintained. No other visible areas of
chronic cortical infarction. No other acute intracranial hemorrhage.
No other significant chronic blood products elsewhere within the
brain.

No visible mass lesion or extra-axial fluid collection. Pituitary
gland suprasellar region grossly normal. Midline structures intact.

Vascular: Major intracranial vascular flow voids are maintained.

Skull and upper cervical spine: Craniocervical junction grossly
normal. Bone marrow signal intensity within normal limits. No scalp
soft tissue abnormality.

Sinuses/Orbits: Patient status post bilateral ocular lens
replacement. Paranasal sinuses are largely clear. No significant
mastoid effusion.

Other: None.

MRA HEAD FINDINGS

Anterior circulation: Examination moderately to severely degraded by
motion artifact. Visualized distal cervical segments of the internal
carotid arteries are patent with antegrade flow. Petrous, cavernous,
and supraclinoid segments patent without significant stenosis or
other visible abnormality. A1 segments patent bilaterally. Normal
anterior communicating artery complex. Anterior cerebral arteries
limited assessment due to motion, but appear to be grossly patent to
their distal aspects on 3D time-of-flight sequence. No visible M1
stenosis or occlusion. Grossly negative MCA bifurcations. Distal MCA
branches grossly perfused and symmetric.

Posterior circulation: Right vertebral artery strongly dominant and
widely patent to the vertebrobasilar junction. Left vertebral artery
hypoplastic and grossly patent as well. Both PICA grossly patent at
their origins. Basilar patent to its distal aspect without stenosis.
Superior cerebellar arteries patent bilaterally. Right PCA supplied
via the basilar. Predominant fetal type origin left PCA. Both PCAs
grossly patent to their distal aspects without stenosis.

Anatomic variants: Fetal type origin of the left PCA. No visible
aneurysm. No visible vascular abnormality seen underlying the left
basal ganglia hemorrhage.
IMPRESSION: MRI HEAD IMPRESSION:

1. Motion degraded exam.
2. Grossly stable appearance of intraparenchymal hemorrhage centered
at the anterior left basal ganglia with surrounding vasogenic edema
and mild regional mass effect. No visible underlying lesion on this
motion degraded noncontrast MRI.
3. Intraventricular extension with blood within the left greater
than right lateral ventricles. Stable ventricular size and
morphology without progressive hydrocephalus or trapping.
4. No other acute intracranial abnormality.

MRA HEAD IMPRESSION:

1. Technically limited exam due to extensive motion artifact.
2. Grossly negative intracranial MRA, with no large vessel
occlusion, hemodynamically significant stenosis, or other acute
vascular abnormality. No visible vascular abnormality seen
underlying the left basal ganglia hemorrhage. No visible aneurysm.

## 2021-06-05 IMAGING — CT CT HEAD W/O CM
4 series · 16 of 47 positions shown, 18 images · non-contrast
Comparison: [DATE]

CLINICAL DATA: Follow-up hemorrhagic stroke.

EXAM:
CT HEAD WITHOUT CONTRAST
TECHNIQUE: Contiguous axial images were obtained from the base of the skull
through the vertex without intravenous contrast.

[Series 3: head wo · axial · 0.43mm/px · z∈[+1256,+1380]mm · 7 of 35 slices shown, 9 images]
[im 5/35  brain]
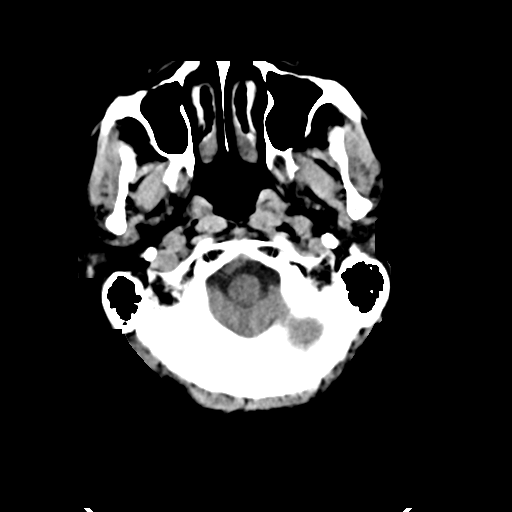
[im 5/35  bone]
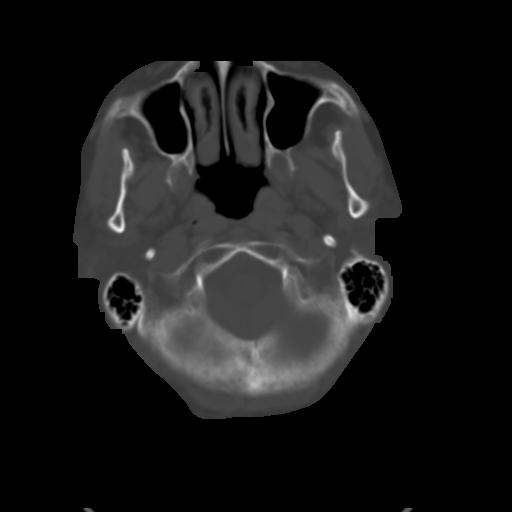
[im 9/35  brain]
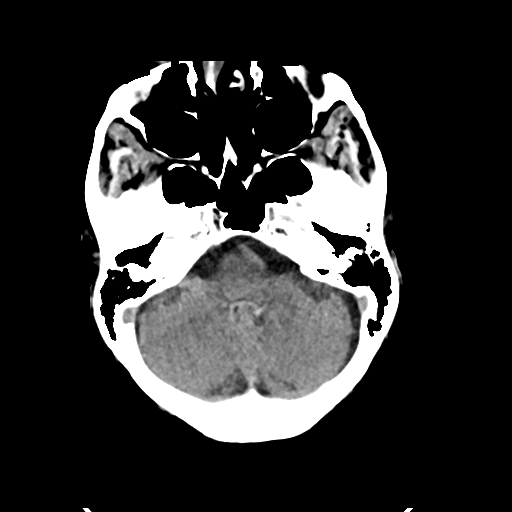
[im 13/35  brain]
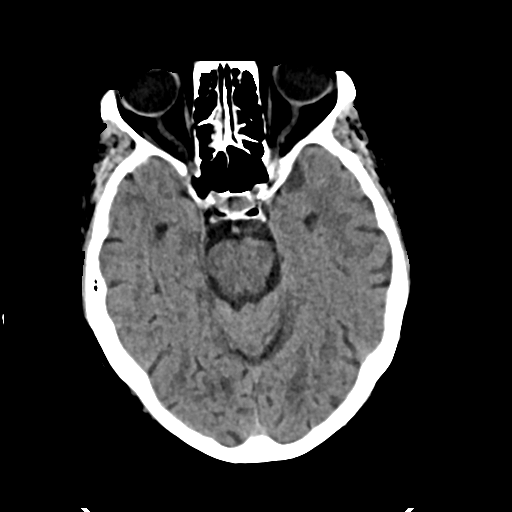
[im 18/35  brain]
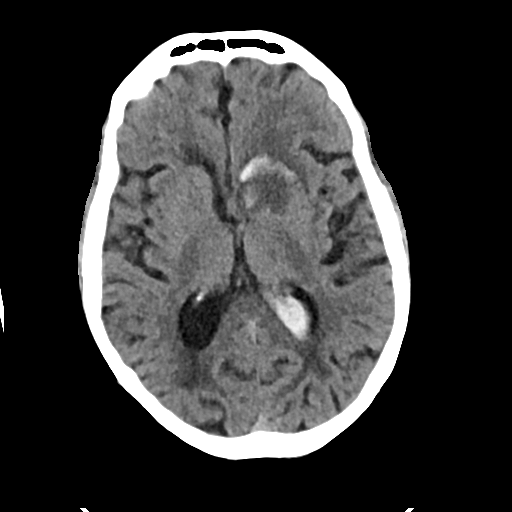
[im 22/35  brain]
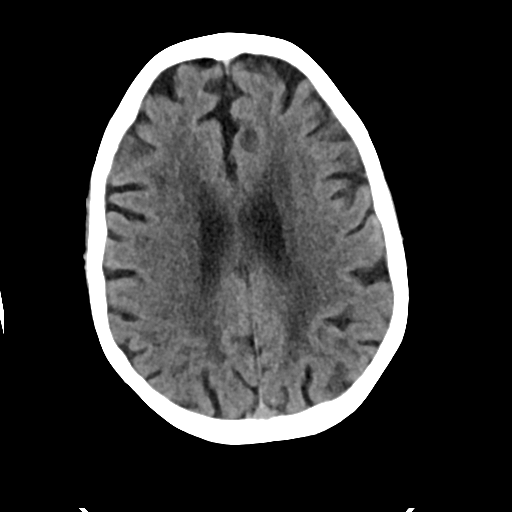
[im 22/35  bone]
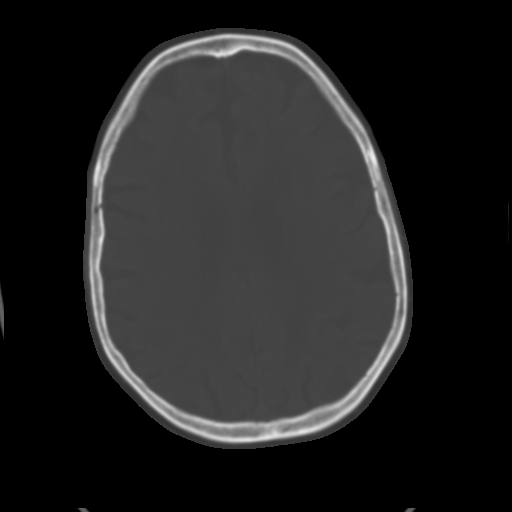
[im 26/35  brain]
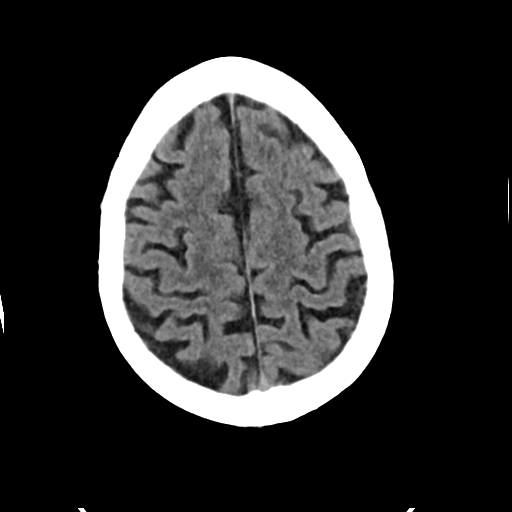
[im 30/35  brain]
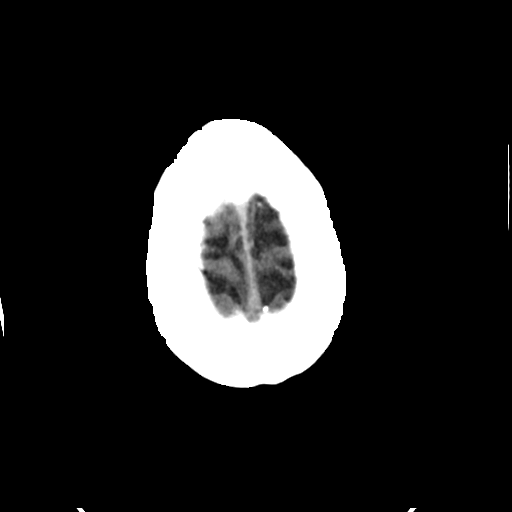

[Series 4: head bone · axial · 0.43mm/px · z∈[+1252,+1288]mm · 3 of 88 slices shown]
[im 9/88  bone]
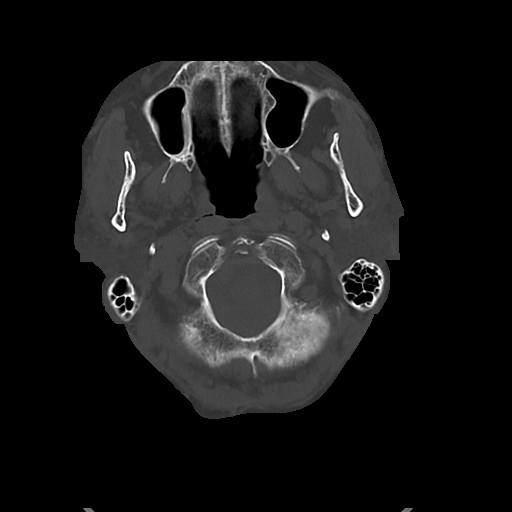
[im 18/88  bone]
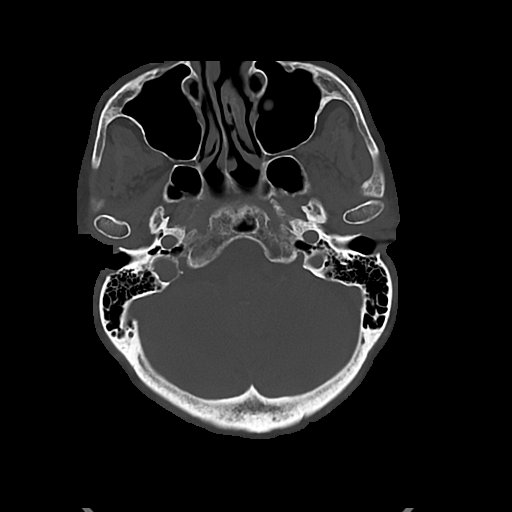
[im 27/88  bone]
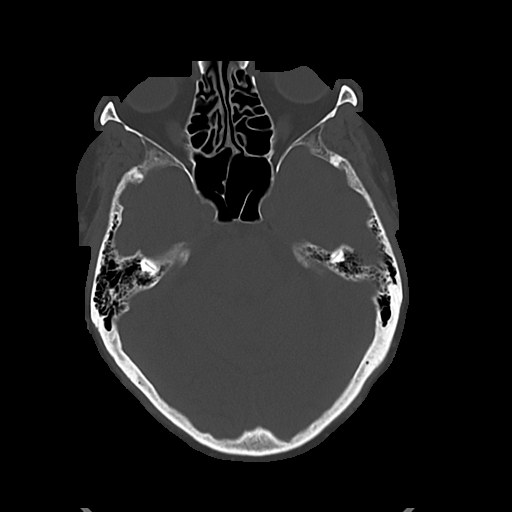

[Series 5: cor soft · coronal · 0.34mm/px · 3 of 70 slices shown]
[im 24/70  brain]
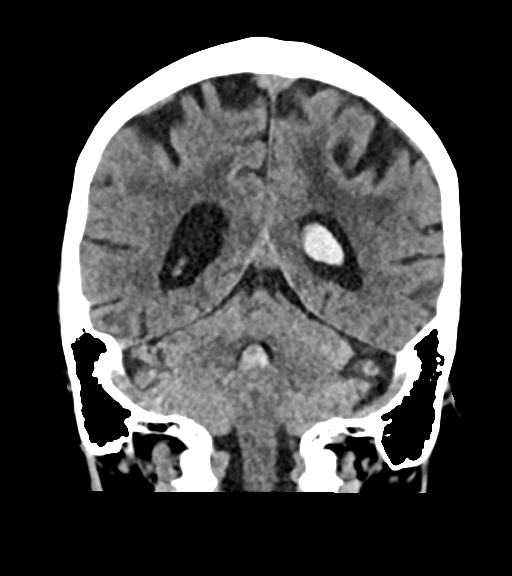
[im 31/70  brain]
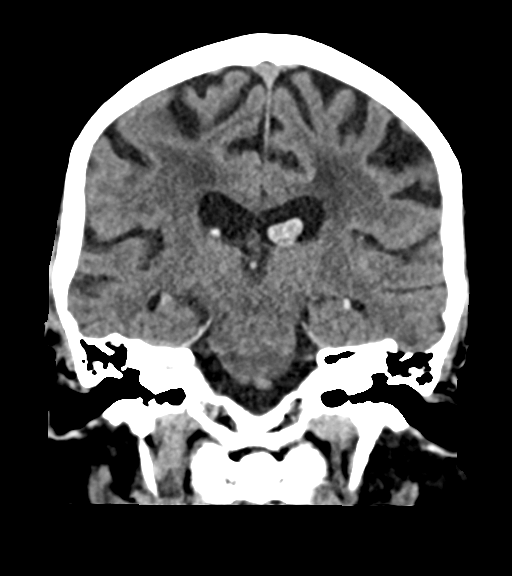
[im 39/70  brain]
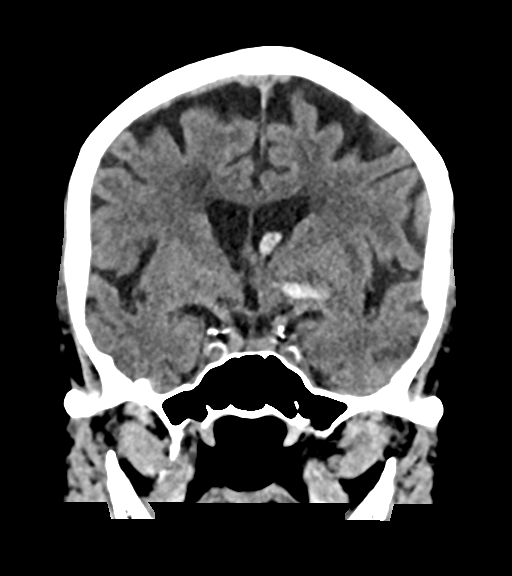

[Series 6: sag soft · sagittal · 0.38mm/px · 3 of 59 slices shown]
[im 20/59  brain]
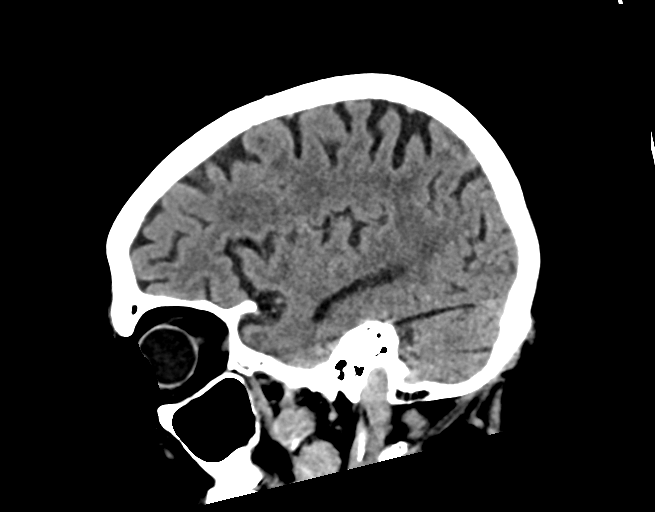
[im 30/59  brain]
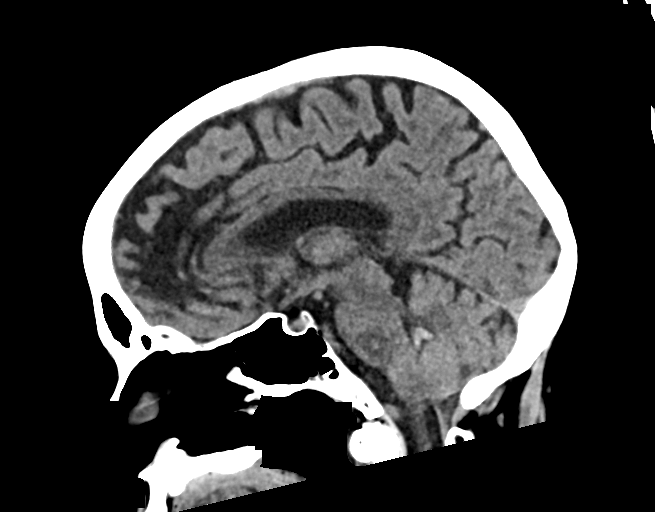
[im 39/59  brain]
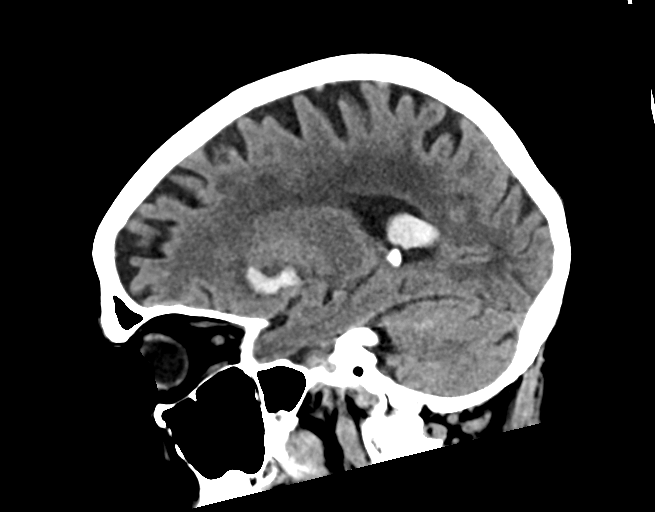

[16 of 47 positions shown; findings below may reference images not displayed]

FINDINGS: Brain: Hematoma below the left caudate head measuring up to 2.5 cm
on axial slices. Intraventricular extension with clot in the left
more than right lateral ventricles and in the third ventricle. Small
volume clot in the fourth ventricle. No hydrocephalus. Increased
edema around the hematoma, expected. No worrisome mass effect.
Underlying chronic small vessel ischemia and cerebral volume loss.
No evidence of cortical infarction.

Vascular: No hyperdense vessel or unexpected calcification.

Skull: Normal. Negative for fracture or focal lesion.

Sinuses/Orbits: No acute finding.
IMPRESSION: Unchanged parenchymal hemorrhage with intraventricular extension. No
hydrocephalus.

## 2021-06-05 MED ORDER — PANTOPRAZOLE SODIUM 40 MG PO TBEC
40.0000 mg | DELAYED_RELEASE_TABLET | Freq: Every day | ORAL | Status: DC
  Administered 2021-06-05 – 2021-06-06 (×2): 40 mg via ORAL
  Filled 2021-06-05 (×2): qty 1

## 2021-06-05 MED ORDER — LORAZEPAM 2 MG/ML IJ SOLN
1.0000 mg | Freq: Once | INTRAMUSCULAR | Status: AC
  Administered 2021-06-05: 1 mg via INTRAVENOUS
  Filled 2021-06-05: qty 1

## 2021-06-05 NOTE — Progress Notes (Incomplete)
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  PMH: basal cell, squamous cell carincomas, HLD, trigeminal neuralgia  PLAN:  Transferring to hospitalist tomorrow (1/12) Continue crestor at discharge   Anticoag: SCDs, CBC WNL  ID: Afebrile CV: BP goal 130-150, BPs within goal Endocrine: glucose normal GI/Nutrition: tolerating PO, PO Protonix 40mg  HS, Senokot-S BID; LBM *** Neuro: Intraparenchymal hemorrhage in left basal ganglia with surrounding edema; currently aphasic with continued questioning Lamictal+Lyrica for trigeminal; tried tegretol in the past 1/9 blood in left > right lateral ventricles, progressive hydrocephalus; RASS 3, ISCDC 1; Xanax 0.25mg  QHS PRN Renal: Scr 0.73 >> 0.5, BMP normal Pulm: ~99% room air Heme/Onc: CBC normal PTA Med Issues: ASA 81mg , Calcitonin 200 QD, Crestor 5 (held with bleed, SPARCL) Best Practices:

## 2021-06-05 NOTE — Progress Notes (Signed)
Inpatient Rehab Admissions Coordinator:  ? ?Per therapy recommendations,  patient was screened for CIR candidacy by Dayshaun Whobrey, MS, CCC-SLP. At this time, Pt. Appears to be a a potential candidate for CIR. I will place   order for rehab consult per protocol for full assessment. Please contact me any with questions. ? ?Ioanna Colquhoun, MS, CCC-SLP ?Rehab Admissions Coordinator  ?[phone] (celll) ?[phone] (office) ? ?

## 2021-06-05 NOTE — Progress Notes (Signed)
°  Transition of Care [facility]) Screening Note   Patient Details  Name: Shelby Hill Date of Birth: [birth year]   Transition of Care [facility]) CM/SW Contact:    Ella Bodo, RN Phone Number: 06/05/2021, 5:29 PM    Transition of Care Department [facility]) has reviewed patient and no TOC needs have been identified at this time. We will continue to monitor patient advancement through interdisciplinary progression rounds. If new patient transition needs arise, please place a TOC consult.    Reinaldo Raddle, RN, BSN  Trauma/Neuro ICU Case Manager [phone]

## 2021-06-05 NOTE — Progress Notes (Signed)
PT Cancellation Note  Patient Details Name: Shelby Hill MRN: [id number] DOB: Dec 05, 1935   Cancelled Treatment:    Reason Eval/Treat Not Completed: Patient not medically ready;Active bedrest order (need updated activity orders).   Leighton Roach, [place]  Pager [phone] Office [place] 06/05/2021, 8:58 AM

## 2021-06-05 NOTE — Evaluation (Signed)
Occupational Therapy Evaluation Patient Details Name: Shelby Hill MRN: [id number] DOB: Nov 19, 1935 Today's Date: 06/05/2021   History of Present Illness Pt is 86 yo female who presents with R facial droop, slurred speech, and confusion. 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  PMH: basal cell carcinoma, squamous cell carcinoma, trigeminal neuralgia, HLD   Clinical Impression   PTA pt lives independently with her husband. At baseline, pt is active in the community, drives and manages her IADL tasks. Pt demonstrates a functional decline, requiring min A with mobility and mod A with ADL tasks due to deficits listed below. Feel pt is an excellent candidate for AIR to maximize functional level of independence and facilitate safe DC home with husband. Acute OT to follow.     Recommendations for follow up therapy are one component of a multi-disciplinary discharge planning process, led by the attending physician.  Recommendations may be updated based on patient status, additional functional criteria and insurance authorization.   Follow Up Recommendations  Acute inpatient rehab (3hours/day)    Assistance Recommended at Discharge Frequent or constant Supervision/Assistance  Patient can return home with the following A little help with walking and/or transfers;A little help with bathing/dressing/bathroom;Assistance with cooking/housework;Direct supervision/assist for medications management;Direct supervision/assist for financial management;Assist for transportation;Help with stairs or ramp for entrance    Functional Status Assessment  Patient has had a recent decline in their functional status and demonstrates the ability to make significant improvements in function in a reasonable and predictable amount of time.  Equipment Recommendations  None recommended by OT    Recommendations for Other Services Rehab consult     Precautions / Restrictions  Precautions Precautions: Fall Precaution Comments: R knee problems recently Restrictions Weight Bearing Restrictions: No      Mobility Bed Mobility Overal bed mobility: Needs Assistance Bed Mobility: Supine to Sit;Sit to Supine     Supine to sit: Supervision Sit to supine: Min assist   General bed mobility comments: pt able to come to EOB from flat bed with increased time but no physical assist. Min A to LE's for return to supine    Transfers Overall transfer level: Needs assistance Equipment used: 1 person hand held assist Transfers: Sit to/from Stand Sit to Stand: Min assist           General transfer comment: unsteady      Balance Overall balance assessment: Needs assistance Sitting-balance support: Feet supported;No upper extremity supported Sitting balance-Leahy Scale: Fair     Standing balance support: Single extremity supported;During functional activity Standing balance-Leahy Scale: Poor Standing balance comment: needs HHA to maintain balance At risk for falls                           ADL either performed or assessed with clinical judgement   ADL Overall ADL's : Needs assistance/impaired Eating/Feeding: Set up;Supervision/ safety Eating/Feeding Details (indicate cue type and reason): baseline tremor; nursing states she fed herself after set up Grooming: Minimal assistance   Upper Body Bathing: Minimal assistance;Sitting   Lower Body Bathing: Moderate assistance;Sit to/from stand   Upper Body Dressing : Minimal assistance   Lower Body Dressing: Moderate assistance;Sit to/from stand   Toilet Transfer: Minimal assistance;Ambulation   Toileting- Clothing Manipulation and Hygiene: Minimal assistance       Functional mobility during ADLs: Minimal assistance       Vision Baseline Vision/History: 1 Wears glasses Additional Comments: Will further assess; does not appear to  be missing targets     Perception Perception Comments:  appears intact   Praxis Praxis Praxis-Other Comments: will further assess; demonstrated difficulty with simple grooming; unsure if language based deficits; difficulty with R/L discrimination    Pertinent Vitals/Pain Pain Assessment: No/denies pain     Hand Dominance Right   Extremity/Trunk Assessment Upper Extremity Assessment Upper Extremity Assessment: Overall WFL for tasks assessed (baseline tremor; does not appeaqr to have focal weakness)   Lower Extremity Assessment Lower Extremity Assessment: Defer to PT evaluation RLE Deficits / Details: hip and ankle test 4/5 bilaterally, R knee weakness from pain 3/5 RLE Sensation: WNL RLE Coordination: decreased gross motor   Cervical / Trunk Assessment Cervical / Trunk Assessment: Normal   Communication Communication Communication: Expressive difficulties   Cognition Arousal/Alertness: Awake/alert Behavior During Therapy: WFL for tasks assessed/performed Overall Cognitive Status: Difficult to assess  Appears unaware of speech deficits; does not appear frustrated with inability to accurately communicate                               General Comments: follows simple commands with delay, sometimes needs them repeated. Difficulty with moving to L/R; Delay noted with processing; Difficult to assess due to expressive aphasia, words not slurred but incorrect words used     General Comments  BP 148/73 after return to bed    Exercises     Shoulder Instructions      Home Living Family/patient expects to be discharged to:: Private residence Living Arrangements: Spouse/significant other Available Help at Discharge: Family;Available 24 hours/day Type of Home: House Home Access: Stairs to enter [employer] of Steps: 3 Entrance Stairs-Rails: Right;Left Home Layout: Two level;Able to live on main level with bedroom/bathroom;Full bath on main level Alternate Level Stairs-Number of Steps: flight Alternate Level  Stairs-Rails: Right Bathroom Shower/Tub: Tub/shower unit   Bathroom Toilet: Handicapped height Bathroom Accessibility: No   Home Equipment: Conservation officer, nature (2 wheels);Cane - single point;BSC/3in1   Additional Comments: husband has had multiple knee surgeries, walks with SPC with affected gait      Prior Functioning/Environment Prior Level of Function : Independent/Modified Independent; drives; husband assists with finances; pt "particular about how she looks" per husband             Mobility Comments: has been ambulating with SPC due to R knee pain (has some screws that may need to come out per husband)          OT Problem List: Decreased strength;Decreased activity tolerance;Impaired balance (sitting and/or standing);Impaired vision/perception;Decreased cognition;Decreased safety awareness;Decreased knowledge of use of DME or AE      OT Treatment/Interventions: Self-care/ADL training;Therapeutic exercise;Neuromuscular education;DME and/or AE instruction;Therapeutic activities;Cognitive remediation/compensation;Visual/perceptual remediation/compensation;Patient/family education;Balance training    OT Goals(Current goals can be found in the care plan section) Acute Rehab OT Goals Patient Stated Goal: per husband for pt to get better adn return homea OT Goal Formulation: With patient/family Time For Goal Achievement: 06/19/21 Potential to Achieve Goals: Good  OT Frequency: Min 2X/week    Co-evaluation              AM-PAC OT "6 Clicks" Daily Activity     Outcome Measure Help from another person eating meals?: A Little Help from another person taking care of personal grooming?: A Little Help from another person toileting, which includes using toliet, bedpan, or urinal?: A Lot Help from another person bathing (including washing, rinsing, drying)?: A Lot Help from another person to  put on and taking off regular upper body clothing?: A Little Help from another person to put  on and taking off regular lower body clothing?: A Lot 6 Click Score: 15   End of Session Equipment Utilized During Treatment: Gait belt Nurse Communication: Mobility status  Activity Tolerance: Patient tolerated treatment well Patient left: in bed;with call bell/phone within reach;with bed alarm set;with family/visitor present  OT Visit Diagnosis: Unsteadiness on feet (R26.81);Muscle weakness (generalized) (M62.81);Apraxia (R48.2);Other symptoms and signs involving cognitive function;Other symptoms and signs involving the nervous system (R29.898)                Time: 1240-1300 OT Time Calculation (min): 20 min Charges:  OT General Charges $OT Visit: 1 Visit OT Evaluation $OT Eval Moderate Complexity: 1 Mod  .hil  Lorijean Husser,HILLARY 06/05/2021, 1:19 PM

## 2021-06-05 NOTE — Progress Notes (Signed)
OT Cancellation Note  Patient Details Name: Shelby Hill MRN: [id number] DOB: 12-Dec-1935   Cancelled Treatment:    Reason Eval/Treat Not Completed: Active bedrest order (Will assess when activity orders updated.)  [facility] Idrissa Beville, OT/L   Acute OT Clinical Specialist Acute Rehabilitation Services Pager [phone] Office [phone]  06/05/2021, 8:36 AM

## 2021-06-05 NOTE — Progress Notes (Signed)
Echocardiogram 2D Echocardiogram has been performed.  Shelby Hill 06/05/2021, 11:00 AM

## 2021-06-05 NOTE — Progress Notes (Signed)
Carotid duplex bilateral study completed.   Please see CV Proc for preliminary results.   Ahyan Kreeger, RDMS, RVT  

## 2021-06-05 NOTE — Plan of Care (Signed)
Called by RN. Intermittently agitated and interfering with treatment. Soft waist belt ordered for restraint for patient safety.  -- Amie Portland, MD Neurologist [facility] Pager: [phone]

## 2021-06-05 NOTE — Evaluation (Signed)
Physical Therapy Evaluation Patient Details Name: Shelby Hill MRN: [id number] DOB: Dec 29, 1935 Today's Date: 06/05/2021  History of Present Illness  Pt is 86 yo female who presents with R facial droop, slurred speech, and confusion. 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  PMH: basal cell carcinoma, squamous cell carcinoma, trigeminal neuralgia, HLD  Clinical Impression  Pt admitted with above diagnosis. Pt received in bed, husband present. Husband relays that pt has been seeing orthopedic MD for R knee dysfunction with potential for removing some screws in her R knee. R knee affected pt's ability to stand from bed. Mod A needed for power up and R foot sliding out from under pt. Once up pt was able to take very small steps with min A. Further ambulation not assessed today as radiology arrived for further testing. Pt with word finding deficits throughout but followed basic commands appropriately. Currently recommending AIR to achieve highest level of function. Pt's husband will be home with her 24/7 and can provide supervision. He has mobility issues and ambulates with a cane.  Pt currently with functional limitations due to the deficits listed below (see PT Problem List). Pt will benefit from skilled PT to increase their independence and safety with mobility to allow discharge to the venue listed below.          Recommendations for follow up therapy are one component of a multi-disciplinary discharge planning process, led by the attending physician.  Recommendations may be updated based on patient status, additional functional criteria and insurance authorization.  Follow Up Recommendations Acute inpatient rehab (3hours/day)    Assistance Recommended at Discharge Frequent or constant Supervision/Assistance  Patient can return home with the following  A lot of help with walking and/or transfers;A lot of help with bathing/dressing/bathroom;Assistance with  cooking/housework;Direct supervision/assist for financial management;Direct supervision/assist for medications management;Assist for transportation;Help with stairs or ramp for entrance    Equipment Recommendations None recommended by PT  Recommendations for Other Services  Speech consult;Rehab consult    Functional Status Assessment Patient has had a recent decline in their functional status and demonstrates the ability to make significant improvements in function in a reasonable and predictable amount of time.     Precautions / Restrictions Precautions Precautions: Fall Precaution Comments: R knee problems recently Restrictions Weight Bearing Restrictions: No      Mobility  Bed Mobility Overal bed mobility: Needs Assistance Bed Mobility: Supine to Sit;Sit to Supine     Supine to sit: Supervision Sit to supine: Min assist   General bed mobility comments: pt able to come to EOB from flat bed with increased time but no physical assist. Min A to LE's for return to supine    Transfers Overall transfer level: Needs assistance Equipment used: 1 person hand held assist Transfers: Sit to/from Stand Sit to Stand: Mod assist           General transfer comment: mod A for power up and R foot kept sliding fwd out from under her due to R knee dysfunction    Ambulation/Gait Ambulation/Gait assistance: Min assist Gait Distance (Feet): 3 Feet Assistive device: 1 person hand held assist Gait Pattern/deviations: Step-to pattern Gait velocity: decreased Gait velocity interpretation: <1.31 ft/sec, indicative of household ambulator   General Gait Details: pt's balance improved the longer she was up, short steps taken with hesitancy to bear wt on R but R knee did not buckle and pt became more confident with time on it. Did not progress gait further due  to radiology arrived to perform a test  Stairs            Wheelchair Mobility    Modified Rankin (Stroke Patients  Only) Modified Rankin (Stroke Patients Only) Pre-Morbid Rankin Score: No symptoms Modified Rankin: Moderately severe disability     Balance Overall balance assessment: Needs assistance Sitting-balance support: Feet supported;No upper extremity supported Sitting balance-Leahy Scale: Fair     Standing balance support: Single extremity supported;During functional activity Standing balance-Leahy Scale: Poor Standing balance comment: needs HHA to maintain balance                             Pertinent Vitals/Pain Pain Assessment: No/denies pain    Home Living Family/patient expects to be discharged to:: Private residence Living Arrangements: Spouse/significant other Available Help at Discharge: Family;Available 24 hours/day Type of Home: House Home Access: Stairs to enter Entrance Stairs-Rails: Right;Left Entrance Stairs-Number of Steps: 3 Alternate Level Stairs-Number of Steps: flight Home Layout: Two level;Able to live on main level with bedroom/bathroom;Full bath on main level Home Equipment: Rolling Walker (2 wheels);Cane - single point;BSC/3in1 Additional Comments: husband has had multiple knee surgeries, walks with SPC with affected gait    Prior Function Prior Level of Function : Independent/Modified Independent             Mobility Comments: has been ambulating with SPC due to R knee pain (has some screws that may need to come out per husband)       Hand Dominance   Dominant Hand: Right    Extremity/Trunk Assessment   Upper Extremity Assessment Upper Extremity Assessment: Defer to OT evaluation    Lower Extremity Assessment Lower Extremity Assessment: RLE deficits/detail RLE Deficits / Details: hip and ankle test 4/5 bilaterally, R knee weakness from pain 3/5 RLE Sensation: WNL RLE Coordination: decreased gross motor    Cervical / Trunk Assessment Cervical / Trunk Assessment: Normal  Communication   Communication: Expressive difficulties   Cognition Arousal/Alertness: Awake/alert Behavior During Therapy: WFL for tasks assessed/performed Overall Cognitive Status: Difficult to assess                                 General Comments: follows simple commands, sometimes needs them repeated. Difficult to assess due to expressive aphasia, words not slurred but incorrect words used        General Comments General comments (skin integrity, edema, etc.): BP 134/92 in sitting, pt denied dizziness throughout.    Exercises     Assessment/Plan    PT Assessment Patient needs continued PT services  PT Problem List Decreased balance;Decreased mobility;Decreased coordination;Decreased strength       PT Treatment Interventions DME instruction;Gait training;Stair training;Functional mobility training;Therapeutic activities;Therapeutic exercise;Balance training;Neuromuscular re-education;Cognitive remediation;Patient/family education    PT Goals (Current goals can be found in the Care Plan section)  Acute Rehab PT Goals Patient Stated Goal: return home, be able to get words out PT Goal Formulation: With patient/family Time For Goal Achievement: 06/19/21 Potential to Achieve Goals: Good    Frequency Min 4X/week     Co-evaluation               AM-PAC PT "6 Clicks" Mobility  Outcome Measure Help needed turning from your back to your side while in a flat bed without using bedrails?: None Help needed moving from lying on your back to sitting on the side of a flat bed  without using bedrails?: None Help needed moving to and from a bed to a chair (including a wheelchair)?: A Little Help needed standing up from a chair using your arms (e.g., wheelchair or bedside chair)?: A Lot Help needed to walk in hospital room?: A Lot Help needed climbing 3-5 steps with a railing? : A Lot 6 Click Score: 17    End of Session Equipment Utilized During Treatment: Gait belt Activity Tolerance: Patient tolerated treatment  well Patient left: in bed;with call bell/phone within reach;with family/visitor present Nurse Communication: Mobility status PT Visit Diagnosis: Unsteadiness on feet (R26.81);Difficulty in walking, not elsewhere classified (R26.2)    Time: 9518-8416 PT Time Calculation (min) (ACUTE ONLY): 19 min   Charges:   PT Evaluation $PT Eval Moderate Complexity: [place], [place]  Pager [phone] Office [place] 06/05/2021, 12:05 PM

## 2021-06-05 NOTE — Progress Notes (Addendum)
STROKE TEAM PROGRESS NOTE   ATTENDING NOTE: I reviewed above note and agree with the assessment and plan. Pt was seen and examined.   86 year old female with history of left trigeminal neuralgia on Lamictal and Lyrica, HLD admitted for right-sided facial droop, slurred speech and confusion.  CT showed left BG ICH with IVH.  Repeat CT stable hematoma and ventricular size.  Pending MRI and MRA head.  EF 60 to 65%, carotid Doppler negative.  LDL and A1c pending.  Creatinine 0.50.  On exam, husband at bedside.  Patient lying bed, awake alert, follows all simple commands.  However patient does have nonsensical speech, word salad, however able to repeat and name, more consistent with transcortical aphasia.  Visual fields full, no gaze palsy, right facial droop, moving all extremities symmetrically.  Sensation subjectively symmetrical, finger-to-nose grossly intact.  Etiology for patient ICH and IVH likely due to hypertension given location.  However, patient BP easily controlled resolved Cleviprex.  And patient has no history of hypertension per husband.  Not on BP meds at home.  Therefore, we will follow-up with MRI with and without contrast to rule out underlying tumor or CAA.  BP goal less than 160.  Continue home Lyrica and Lamictal for trigeminal neuralgia. PT/OT recommend CIR.  For detailed assessment and plan, please refer to above as I have made changes wherever appropriate.   Shelby Hawking, MD PhD Stroke Neurology 06/05/2021 6:25 PM  This patient is critically ill due to [place] with IVH, hypertension and at significant risk of neurological worsening, death form hematoma expansion, hydrocephalus, cerebral edema, hypertensive encephalopathy. This patient's care requires constant monitoring of vital signs, hemodynamics, respiratory and cardiac monitoring, review of multiple databases, neurological assessment, discussion with family, other specialists and medical decision making of high complexity. I spent  35 minutes of neurocritical care time in the care of this patient. I had long discussion with husband at bedside, updated pt current condition, treatment plan and potential prognosis, and answered all the questions.  He expressed understanding and appreciation.      INTERVAL HISTORY Her husband is at the bedside.  Still having expressive aphasia, oriented to self and place. Difficulty answering questions, able to name most objects. Repetition intact, paucity in speech. MRI/MRA to r/o CAA. Stable to move out of ICU, transfer orders placed. 1mg  ativan ordered for MRI, she has claustrophobia in the MRI machine. Patient mentioned something about falling yesterday during her stroke, but after speaking with her husband, he stated that she has not fallen recently at all.   Vitals:   06/05/21 1200 06/05/21 1300 06/05/21 1400 06/05/21 1500  BP: (!) 154/71 134/72 (!) 141/68 (!) 133/92  Pulse: 81 81 85 85  Resp: (!) 22 15 16  (!) 22  Temp: 98.5 F (36.9 C)     TempSrc: Oral     SpO2: 97% 100% 99% 99%  Weight:      Height:       CBC:  Recent Labs  Lab 06/04/21 1700 06/04/21 1743  WBC 6.5  --   NEUTROABS 4.4  --   HGB 12.9 13.3  HCT 39.3 39.0  MCV 92.9  --   PLT 210  --    Basic Metabolic Panel:  Recent Labs  Lab 06/04/21 1700 06/04/21 1743  NA 135 140  K 3.8 3.5  CL 103 103  CO2 27  --   GLUCOSE 95 96  BUN 17 17  CREATININE 0.73 0.50  CALCIUM 8.6*  --    Lipid  Panel: No results for input(s): CHOL, TRIG, HDL, CHOLHDL, VLDL, LDLCALC in the last 168 hours. HgbA1c: No results for input(s): HGBA1C in the last 168 hours. Urine Drug Screen: No results for input(s): LABOPIA, COCAINSCRNUR, LABBENZ, AMPHETMU, THCU, LABBARB in the last 168 hours.  Alcohol Level No results for input(s): ETH in the last 168 hours.  IMAGING past 24 hours CT HEAD WO CONTRAST (5MM)  Result Date: 06/05/2021 CLINICAL DATA:  Follow-up hemorrhagic stroke. EXAM: CT HEAD WITHOUT CONTRAST TECHNIQUE: Contiguous  axial images were obtained from the base of the skull through the vertex without intravenous contrast. COMPARISON:  06/04/2021 FINDINGS: Brain: Hematoma below the left caudate head measuring up to 2.5 cm on axial slices. Intraventricular extension with clot in the left more than right lateral ventricles and in the third ventricle. Small volume clot in the fourth ventricle. No hydrocephalus. Increased edema around the hematoma, expected. No worrisome mass effect. Underlying chronic small vessel ischemia and cerebral volume loss. No evidence of cortical infarction. Vascular: No hyperdense vessel or unexpected calcification. Skull: Normal. Negative for fracture or focal lesion. Sinuses/Orbits: No acute finding. IMPRESSION: Unchanged parenchymal hemorrhage with intraventricular extension. No hydrocephalus. Electronically Signed   By: Jorje Guild M.D.   On: 06/05/2021 05:32   ECHOCARDIOGRAM COMPLETE  Result Date: 06/05/2021    ECHOCARDIOGRAM REPORT   Patient Name:   Shelby Hill Date of Exam: 06/05/2021 Medical Rec #:  [id number]    Height:       66.0 in Accession #:    [id number]   Weight:       172.6 lb Date of Birth:  Jul 17, 1935   BSA:          1.879 m Patient Age:    89 years     BP:           154/69 mmHg Patient Gender: F            HR:           87 bpm. Exam Location:  Inpatient Procedure: 2D Echo Indications:    Stroke  History:        Patient has no prior history of Echocardiogram examinations.  Sonographer:    Arlyss Gandy Referring Phys: [place]  1. Left ventricular ejection fraction, by estimation, is 60 to 65%. The left ventricle has normal function. The left ventricle has no regional wall motion abnormalities. Left ventricular diastolic parameters are consistent with Grade I diastolic dysfunction (impaired relaxation).  2. Right ventricular systolic function is normal. The right ventricular size is normal. Tricuspid regurgitation signal is inadequate for assessing PA pressure.   3. The mitral valve is normal in structure. No evidence of mitral valve regurgitation. No evidence of mitral stenosis.  4. The aortic valve is tricuspid. Aortic valve regurgitation is not visualized. Aortic valve sclerosis is present, with no evidence of aortic valve stenosis.  5. The inferior vena cava is normal in size with greater than 50% respiratory variability, suggesting right atrial pressure of 3 mmHg. Conclusion(s)/Recommendation(s): No intracardiac source of embolism detected on this transthoracic study. Consider a transesophageal echocardiogram to exclude cardiac source of embolism if clinically indicated. FINDINGS  Left Ventricle: Left ventricular ejection fraction, by estimation, is 60 to 65%. The left ventricle has normal function. The left ventricle has no regional wall motion abnormalities. The left ventricular internal cavity size was normal in size. There is  no left ventricular hypertrophy. Left ventricular diastolic parameters are consistent with Grade I diastolic dysfunction (impaired relaxation).  Indeterminate filling pressures. Right Ventricle: The right ventricular size is normal. No increase in right ventricular wall thickness. Right ventricular systolic function is normal. Tricuspid regurgitation signal is inadequate for assessing PA pressure. Left Atrium: Left atrial size was normal in size. Right Atrium: Right atrial size was normal in size. Pericardium: There is no evidence of pericardial effusion. Mitral Valve: The mitral valve is normal in structure. No evidence of mitral valve regurgitation. No evidence of mitral valve stenosis. Tricuspid Valve: The tricuspid valve is normal in structure. Tricuspid valve regurgitation is not demonstrated. No evidence of tricuspid stenosis. Aortic Valve: The aortic valve is tricuspid. Aortic valve regurgitation is not visualized. Aortic valve sclerosis is present, with no evidence of aortic valve stenosis. Aortic valve mean gradient measures 5.0 mmHg.  Aortic valve peak gradient measures 9.7  mmHg. Aortic valve area, by VTI measures 1.71 cm. Pulmonic Valve: The pulmonic valve was normal in structure. Pulmonic valve regurgitation is not visualized. No evidence of pulmonic stenosis. Aorta: The aortic root is normal in size and structure. Venous: The inferior vena cava is normal in size with greater than 50% respiratory variability, suggesting right atrial pressure of 3 mmHg. IAS/Shunts: No atrial level shunt detected by color flow Doppler.  LEFT VENTRICLE PLAX 2D LVIDd:         4.19 cm   Diastology LVIDs:         3.15 cm   LV e' medial:    5.00 cm/s LV PW:         1.01 cm   LV E/e' medial:  14.5 LV IVS:        0.98 cm   LV e' lateral:   6.64 cm/s LVOT diam:     2.00 cm   LV E/e' lateral: 10.9 LV SV:         53 LV SV Index:   28 LVOT Area:     3.14 cm  RIGHT VENTRICLE RV S prime:     13.20 cm/s TAPSE (M-mode): 1.9 cm LEFT ATRIUM             Index LA diam:        2.60 cm 1.38 cm/m LA Vol (A2C):   38.7 ml 20.60 ml/m LA Vol (A4C):   29.5 ml 15.70 ml/m LA Biplane Vol: 33.9 ml 18.05 ml/m  AORTIC VALVE AV Area (Vmax):    1.65 cm AV Area (Vmean):   1.64 cm AV Area (VTI):     1.71 cm AV Vmax:           156.00 cm/s AV Vmean:          106.000 cm/s AV VTI:            0.310 m AV Peak Grad:      9.7 mmHg AV Mean Grad:      5.0 mmHg LVOT Vmax:         82.00 cm/s LVOT Vmean:        55.300 cm/s LVOT VTI:          0.169 m LVOT/AV VTI ratio: 0.55  AORTA Ao Root diam: 3.00 cm Ao Asc diam:  3.00 cm MITRAL VALVE MV Area (PHT): 4.17 cm    SHUNTS MV Decel Time: 182 msec    Systemic VTI:  0.17 m MV E velocity: 72.40 cm/s  Systemic Diam: 2.00 cm MV A velocity: 86.50 cm/s MV E/A ratio:  0.84 Mihai Croitoru MD Electronically signed by Sanda Klein MD Signature Date/Time: 06/05/2021/11:10:14 AM  Final    CT HEAD CODE STROKE WO CONTRAST  Result Date: 06/04/2021 CLINICAL DATA:  Code stroke. EXAM: CT HEAD WITHOUT CONTRAST TECHNIQUE: Contiguous axial images were obtained from the  base of the skull through the vertex without intravenous contrast. COMPARISON:  No prior head CT, correlation is made with MRI brain 01/13/2021. FINDINGS: Brain: High density material, likely hemorrhage, centered in the left basal ganglia, extending into the left lateral ventricle, third ventricle, and fourth ventricle. No hydrocephalus. The parenchymal portion hemorrhage measures up to 2.8 x 2.0 x 1.4 cm (series 3, image 11 and series 5, image 30). There is mild surrounding hypodensity, likely edema. No significant mass effect or midline shift. No extra-axial collection. Periventricular white matter changes, likely the sequela of chronic small vessel ischemic disease. No acute infarct. Vascular: No hyperdense vessel or unexpected calcification. Skull: Normal. Negative for fracture or focal lesion. Sinuses/Orbits: No acute finding. Other: The mastoid air cells are well aerated. IMPRESSION: 1. Intraparenchymal hemorrhage, centered in the left basal ganglia, with mild surrounding edema but without significant mass effect or midline shift. 2. Intraventricular extension into the left lateral ventricle, third ventricle, and fourth ventricle. No hydrocephalus. Code stroke imaging results were communicated on 06/04/2021 at 5:12 pm to provider Dr. Cheral Marker via secure text paging. Electronically Signed   By: Merilyn Baba M.D.   On: 06/04/2021 17:15   VAS US CAROTID  Result Date: 06/05/2021 Carotid Arterial Duplex Study Patient Name:  Shelby Hill  Date of Exam:   06/05/2021 Medical Rec #: [id number]     Accession #:    [id number] Date of Birth: 01/21/1936    Patient Gender: F Patient Age:   7 years Exam Location:  [facility] Procedure:      VAS US CAROTID Referring Phys: Cornelius Moras [place] Ku[place] --------------------------------------------------------------------------------  Indications:       CVA. Risk Factors:      Hyperlipidemia. Other Factors:     Intracranial hemorrhage. Comparison Study:  No prior studies. Performing  Technologist: Darlin Coco RDMS, RVT  Examination Guidelines: A complete evaluation includes B-mode imaging, spectral Doppler, color Doppler, and power Doppler as needed of all accessible portions of each vessel. Bilateral testing is considered an integral part of a complete examination. Limited examinations for reoccurring indications may be performed as noted.  Right Carotid Findings: +----------+--------+--------+--------+------------------+------------------+             PSV cm/s EDV cm/s Stenosis Plaque Description Comments            +----------+--------+--------+--------+------------------+------------------+  CCA Prox   53       11                                                       +----------+--------+--------+--------+------------------+------------------+  CCA Distal 77       12                                   intimal thickening  +----------+--------+--------+--------+------------------+------------------+  ICA Prox   42       9                                                        +----------+--------+--------+--------+------------------+------------------+  ICA Distal 30       18                                   tortuous            +----------+--------+--------+--------+------------------+------------------+  ECA        87       9                                                        +----------+--------+--------+--------+------------------+------------------+ +----------+--------+-------+----------------+-------------------+             PSV cm/s EDV cms Describe         Arm Pressure (mmHG)  +----------+--------+-------+----------------+-------------------+  Subclavian 99               Multiphasic, WNL                      +----------+--------+-------+----------------+-------------------+ +---------+--------+--+--------+--+---------+  Vertebral PSV cm/s 40 EDV cm/s 13 Antegrade  +---------+--------+--+--------+--+---------+  Left Carotid Findings:  +----------+--------+--------+--------+-------------------------+--------+             PSV cm/s EDV cm/s Stenosis Plaque Description        Comments  +----------+--------+--------+--------+-------------------------+--------+  CCA Prox   97       11                                                    +----------+--------+--------+--------+-------------------------+--------+  CCA Distal 66       15                                                    +----------+--------+--------+--------+-------------------------+--------+  ICA Prox   44       10       1-39%    calcific and heterogenous           +----------+--------+--------+--------+-------------------------+--------+  ICA Distal 64       22                                                    +----------+--------+--------+--------+-------------------------+--------+  ECA        92       8                                                     +----------+--------+--------+--------+-------------------------+--------+ +----------+--------+--------+----------------+-------------------+             PSV cm/s EDV cm/s Describe         Arm Pressure (mmHG)  +----------+--------+--------+----------------+-------------------+  Subclavian 137               Multiphasic, WNL                      +----------+--------+--------+----------------+-------------------+ +---------+--------+--+--------+-+---------+  Vertebral PSV cm/s 51 EDV cm/s 7 Antegrade  +---------+--------+--+--------+-+---------+   Summary: Right Carotid: The extracranial vessels were near-normal with only minimal wall                thickening or plaque. Left Carotid: Velocities in the left ICA are consistent with a 1-39% stenosis. Vertebrals:  Bilateral vertebral arteries demonstrate antegrade flow. Subclavians: Normal flow hemodynamics were seen in bilateral subclavian              arteries. *See table(s) above for measurements and observations.     Preliminary     PHYSICAL EXAM  Physical Exam  Constitutional:  Appears well-developed and well-nourished.  Psych: Affect appropriate to situation Eyes: No scleral injection HENT: No OP obstrucion MSK: no joint deformities.  Cardiovascular: Normal rate and regular rhythm.  Respiratory: Effort normal, non-labored breathing GI: Soft.  No distension. There is no tenderness.  Skin: WDI  Neuro: Mental Status: Patient is awake, alert, oriented to person and place. Transcortical expressive aphasia, difficulty answering questions related to time and situation  Naming simple objects and repetition intact.  Cranial Nerves: II: Visual Fields are full. Pupils are equal, round, and reactive to light.   III,IV, VI: EOMI without ptosis or diploplia.  V: Facial sensation is symmetric to temperature VII: Slight right facial droop VIII: Hearing is intact to voice X: Palate elevates symmetrically XI: Shoulder shrug is symmetric. XII: Tongue protrudes midline without atrophy or fasciculations.  Motor: Tone is normal. Bulk is normal. 5/5 strength was present in all four extremities.  Sensory: Sensation is symmetric to light touch and temperature in the arms and legs. No extinction to DSS present.  Deep Tendon Reflexes: 2+ and symmetric in the biceps and patellae.  Coordination: FNF intact, baseline tremor bilaterally    ASSESSMENT/PLAN Shelby Hill is a 86 y.o. female with history of basal cell carcinoma, squamous cell carcinoma, trigeminal neuralgia and HLD presenting with acute onset of right facial droop, garbled speech and confusion. LKN was 1:30 PM while getting a facial at a salon. Staff then noticed patient to be acutely confused and answering questions inappropriately. Technician thought that she was talking in her sleep during the facial; after the facial was done, she contacted the patient's husband. EMS was called and on arrival they noted slurred speech and confusion. CT showed IPH centered in the Lt basal ganglia, mild surrounding edema,  intraventricular extension into the left later, third, and fourth ventricle. PT recommends CIR. Patient has transfer orders placed. MRI/MRA pending for further imaging on her brain to narrow down cause of hemorrhage. Labs are unremarkable, LDL, Ha1C pending.  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 Intraventricular extension into the left lateral ventricle, third ventricle, and fourth ventricle. No hydrocephalus Repeat CT- unchanged IPH MRI  pending MRA  pending LDL Pending HgbA1c Pending. VTE prophylaxis - SCDs aspirin 81 mg daily prior to admission, now on No antithrombotic. Given IPH Therapy recommendations:  CIR Disposition:  pending  Hypertension Home meds:  None Stable BP systolic [phone] Cleviprex infusion if needed  Hyperlipidemia Home meds:  Crestor LDL Pending Continue statin at discharge  Diabetes type II  Home meds:  None HgbA1c pending., goal < 7.0  Other Stroke Risk Factors Advanced Age >/= 70   Other Active Problems Trigeminal Neuralgia- sees [facility] outpatient  Lamictal, duloxetine,  and lyrica Previously tried to stop lyrica but had an increase in nerve pain   Hospital day # 1  Patient seen and examined by NP/APP with MD. MD to update note as needed.   Janine Ores, DNP, FNP-BC [facility] Pager: [phone]   To contact Stroke Continuity provider, please refer to [email or web address] After hours, contact General Neurology

## 2021-06-05 NOTE — Research (Signed)
Patient eligible for the ICH BP study. She is > 18 years with spontaneous ICH on CT Head upon admission. Patient made aware of study and reviewed patient information sheet. Left with patient and husband at the bedside.   Patient cooperative and completed at sets of BP consistent with guidelines.   Kathrin Greathouse  Stroke Response Coordinator

## 2021-06-06 DIAGNOSIS — I161 Hypertensive emergency: Secondary | ICD-10-CM

## 2021-06-06 LAB — LIPID PANEL
Cholesterol: 127 mg/dL (ref 0–200)
HDL: 62 mg/dL (ref >40)
LDL Cholesterol: 56 mg/dL (ref 0–99)
Total CHOL/HDL Ratio: 2 RATIO
Triglycerides: 45 mg/dL (ref <150)
VLDL: 9 mg/dL (ref 0–40)

## 2021-06-06 LAB — HEMOGLOBIN A1C
Hgb A1c MFr Bld: 5.5 % (ref 4.8–5.6)
Mean Plasma Glucose: 111.15 mg/dL

## 2021-06-06 NOTE — TOC CAGE-AID Note (Signed)
Transition of Care [facility]) - CAGE-AID Screening   Patient Details  Name: Shelby Hill MRN: [id number] Date of Birth: [birth year]  Transition of Care [facility]) CM/SW Contact:    Queenie Aufiero C Tarpley[place]Carter, [place] Phone Number: 06/06/2021, 8:27 AM   Clinical Narrative: Pt is unable to participate in Cage Aid.  Adely Facer Tarple[place]-Carter, MSW, LCSW-A Pronouns:  She/Her/Hers [place] Transitions of Care Clinical Social Worker Direct Number:  [phone] [email or web address]   CAGE-AID Screening: Substance Abuse Screening unable to be completed due to: : Patient unable to participate             Substance Abuse Education Offered: No

## 2021-06-06 NOTE — Progress Notes (Signed)
Inpatient Rehabilitation Admissions Coordinator   I met at bedside with patient and her spouse for rehab assessment. I discussed goals and expectations of a possible Cir admit. Spouse will discuss with their children their preference, but is in agreement for me to begin insurance Auth with [facility] for possible Cir admit.I will begin Auth today.  Danne Baxter, RN, MSN Rehab Admissions Coordinator [phone] 06/06/2021 10:41 AM

## 2021-06-06 NOTE — PMR Pre-admission (Signed)
PMR Admission Coordinator Pre-Admission Assessment  Patient: Shelby Hill is an 86 y.o., female MRN: [id number] DOB: [birth year] Height: _0  (167.6 cm)Weight: 78.3 kg  Insurance Information HMO: yes    PPO:      PCP:      IPA:      80/20:      OTHER:  PRIMARYJosephine Igo [place]      Policy#: [id number]      Subscriber: pt CM Name: Myriam Jacobson      Phone#: [phone] Ext [phone] approved for 7 days     Fax#: [phone] Pre-Cert#: [id number]      Employer:  Benefits:  Phone #: [phone]     Name: 1/11 Eff. Date: 05/28/2019     Deduct: none      Out of Pocket Max: $4000      Life Max: none CIR: $160 co pay per day days 1 until 10      SNF: 100% coverage Outpatient: $20 per visit     Co-Pay: visits per medical neccesity Home Health: 100%      Co-Pay: visits per medical neccesity DME: 80%     Co-Pay: 20% Providers: in network  SECONDARY: none  Financial Counselor:       Phone#:   The [employer] for patients in Inpatient Rehabilitation Facilities with attached Privacy Act [place] Records was provided and verbally reviewed with: Patient and Family  Emergency Contact Information Contact Information     Name Relation Home Work Mobile   Pak,Tommy Spouse [phone]  [phone]      Current Medical History  Patient Admitting Diagnosis: ICH  History of Present Illness: 86 year old female with medical history of basal cell carcinoma, squamous cell carcinoma, trigeminal neuralgia and HLD with presented on 06/04/2021 with acute onset of right facial droop, garbled speech and confusion. 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 Repeat CT stable hematoma and ventricular size. MRI head stable appearance of IPH centered at the anterior left basal ganglia with surrounding vasogenic edema and mild regional mass effect. No visible underlying lesion. IV extension with blood within the left greater than right lateral ventricles. No other acute  intracranial abnormality. MRA head grossly negative intracranial MRA, . EF 60 to 65 % and carotid dopplers negative. Neurology consulted. Etiology of ICH and IVH likely due to HTN. BP controlled with Cleviprex. No prior history of HTN. MRI to rule out underlying tumor or CAA pending. Continue home Lyrica and Lamictal for trigeminal neuralgia. SLP for expressive aphasia. VTE prophylaxis SCDS. Asa 81 mg prior to admission, now on No antithrombotic given IPH.   Complete NIHSS TOTAL: 4  Patient's medical record from [facility] has been reviewed by the rehabilitation admission coordinator and physician.  Past Medical History  Past Medical History:  Diagnosis Date   BCC (basal cell carcinoma) 02/09/1988   left upper thigh,left lower thigh tx cx3 47f   BCC (basal cell carcinoma) 10/04/1962   left clavicle,left breast, Back   BCC (basal cell carcinoma) 03/06/1989   mid back   BCC (basal cell carcinoma) 04/02/1990   left upper shoulder (CX35FU),left upper breast(CX35FU),above left clavicle, right outer back,   BCC (basal cell carcinoma) 04/01/1991   right cetner outer brown (CX3+exc. ), upper left chest,upper left chest medial, right back   BCC (basal cell carcinoma) 02/01/1994   right back   BCC (basal cell carcinoma) 03/26/1995   central upper back (CX35FU),left upper back (CX35FU)   BCC (  basal cell carcinoma) 12/30/1997   right cheek (exc. )   BCC (basal cell carcinoma) 08/06/1999   upper right shin (CX35FU), upper right shin (CX35FU)   BCC (basal cell carcinoma) 08/24/1998   left breast (CX35FU)   BCC (basal cell carcinoma) 08/05/2001   right inner cheek (MOHS), Left upper back (CX35FU),right upper back (CX35FU),, right top hand (CX35FU)   BCC (basal cell carcinoma) 08/02/2002   upper back(CX35FU)   BCC (basal cell carcinoma) 09/12/2003   right mid back (CX35FU), right thigh (deep freeze+aldara) Left thigh outer (deep freeze +aldara)   BCC (basal cell carcinoma) 06/03/2005    rigth scapula (CX35FU),    BCC (basal cell carcinoma) 06/05/2006   right ant. 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 (CX35FU)   SCC (squamous cell carcinoma) 06/05/2006   mid chest (CX35FU)   SCC (squamous cell carcinoma) 08/08/2010   left temple (CX35FU), Left upper arm (CX35FU), right forearm/wrist (CX35FU)   SCC (squamous cell carcinoma) 02/06/2012   Left post neck-tx p bx   SCC (squamous cell carcinoma) 11/09/2012   left hand-tx p bx-, left chest sup -tx p bx, left chest inf. (CX35FU)   SCC (squamous cell carcinoma) 08/04/2014   left clavicle-tx p bx   SCC (squamous cell carcinoma) 11/30/2014   lower right leg distal (CX35FU), lower right leg, prox. (CX35FU)   SCC (squamous cell carcinoma) 05/17/2015   right jawline (CX35FU), Left sideburn (CX35FU)   SCC (squamous cell carcinoma) 09/10/2017   left forearm-tx p bx, left temple -tx p bx   Has the patient had major surgery during 100 days prior to admission? No  Family History   family history includes CAD in her mother; Stroke in her brother and sister.  Current Medications  Current Facility-Administered Medications:    acetaminophen (TYLENOL) tablet 650 mg, 650 mg, Oral, Q4H PRN **OR** acetaminophen (TYLENOL) 160 MG/5ML solution 650 mg, 650 mg, Per Tube, Q4H PRN **OR** acetaminophen (TYLENOL) suppository 650 mg, 650 mg, Rectal, Q4H PRN, Kerney Elbe, MD   ALPRAZolam Duanne Moron) tablet 0.25  mg, 0.25 mg, Oral, QHS PRN, Kerney Elbe, MD, 0.25 mg at 06/06/21 2229   [START ON 06/08/2021] calcitonin (salmon) (MIACALCIN/FORTICAL) nasal spray 1 spray, 1 spray, Alternating Nares, Daily, Rozann Lesches, RPH   Chlorhexidine Gluconate Cloth 2 % PADS 6 each, 6 each, Topical, Daily, Kerney Elbe, MD, 6 each at 06/07/21 1149   DULoxetine (CYMBALTA) DR capsule 60 mg, 60 mg, Oral, Daily, Kerney Elbe, MD, 60 mg at 06/07/21 1042   lamoTRIgine (LAMICTAL) tablet 25 mg, 25 mg, Oral, BID, Kerney Elbe, MD, 25 mg at 06/07/21 1042   pantoprazole (PROTONIX) EC tablet 40 mg, 40 mg, Oral, QHS, Rosalin Hawking, MD, 40 mg at 06/06/21 2230   pregabalin (LYRICA) capsule 25 mg, 25 mg, Oral, BID, Kerney Elbe, MD, 25 mg at 06/07/21 1042   [START ON 06/08/2021] rosuvastatin (CRESTOR) tablet 5 mg, 5 mg, Oral,  Daily, Rozann Lesches, RPH   senna-docusate (Senokot-S) tablet 1 tablet, 1 tablet, Oral, BID, Kerney Elbe, MD, 1 tablet at 06/07/21 1042   sodium chloride flush (NS) 0.9 % injection 3 mL, 3 mL, Intravenous, Once, Elnora Morrison, MD  Patients Current Diet:  Diet Order             Diet heart healthy/carb modified Room service appropriate? Yes with Assist; Fluid consistency: Thin  Diet effective now                  Precautions / Restrictions Precautions Precautions: Fall Precaution Comments: R knee problems recently Restrictions Weight Bearing Restrictions: No   Has the patient had 2 or more falls or a fall with injury in the past year? No  Prior Activity Level Community (5-7x/wk): used cane due to knee issues  Prior Functional Level Self Care: Did the patient need help bathing, dressing, using the toilet or eating? Independent  Indoor Mobility: Did the patient need assistance with walking from room to room (with or without device)? Independent  Stairs: Did the patient need assistance with internal or external stairs (with or without device)? Independent  Functional Cognition: Did the  patient need help planning regular tasks such as shopping or remembering to take medications? Independent  Patient Information Are you of Hispanic, Latino/a,or Spanish origin?: A. No, not of Hispanic, Latino/a, or Spanish origin What is your race?: A. White Do you need or want an interpreter to communicate with a doctor or health care staff?: 0. No  Patient's Response To:  Health Literacy and Transportation Is the patient able to respond to health literacy and transportation needs?: Yes Health Literacy - How often do you need to have someone help you when you read instructions, pamphlets, or other written material from your doctor or pharmacy?: Never In the past 12 months, has lack of transportation kept you from medical appointments or from getting medications?: No In the past 12 months, has lack of transportation kept you from meetings, work, or from getting things needed for daily living?: No  Home Assistive Devices / Equipment Home Equipment: Conservation officer, nature (2 wheels), [employer] - single point, BSC/3in1  Prior Device Use: Indicate devices/aids used by the patient prior to current illness, exacerbation or injury?  SPC  Current Functional Level Cognition  Overall Cognitive Status: Difficult to assess Difficult to assess due to: Impaired communication Orientation Level: Oriented to person, Oriented to place, Oriented to time General Comments: follows simple 1 step commands with delay but seems to understand at times. Does better when given choices rather than open ended questions. Disoriented to place and situaiton but able to answer month and year when provided choices    Extremity Assessment (includes Sensation/Coordination)  Upper Extremity Assessment: Overall WFL for tasks assessed (baseline tremor; does not appeaqr to have focal weakness)  Lower Extremity Assessment: Defer to PT evaluation RLE Deficits / Details: hip and ankle test 4/5 bilaterally, R knee weakness from pain 3/5 RLE  Sensation: WNL RLE Coordination: decreased gross motor    ADLs  Overall ADL's : Needs assistance/impaired Eating/Feeding: Set up, Supervision/ safety Eating/Feeding Details (indicate cue type and reason): baseline tremor; nursing states she fed herself after set up Grooming: Minimal assistance Upper Body Bathing: Minimal assistance, Sitting Lower Body Bathing: Moderate assistance, Sit to/from stand Upper Body Dressing : Minimal assistance Lower Body Dressing: Moderate assistance, Sit to/from stand Toilet Transfer: Minimal assistance, Ambulation Toileting- Clothing Manipulation and Hygiene: Minimal assistance Functional mobility during ADLs: Minimal assistance  Mobility  Overal bed mobility: Needs Assistance Bed Mobility: Supine to Sit Supine to sit: Min assist Sit to supine: Min assist General bed mobility comments: minA to reposition hips towards EOB    Transfers  Overall transfer level: Needs assistance Equipment used: 1 person hand held assist Transfers: Sit to/from Stand Sit to Stand: Mod assist General transfer comment: therapist placed foot in front of patient's R foot due to patient's foot kept sliding fwd. ModA for boost up into standing    Ambulation / Gait / Stairs / Wheelchair Mobility  Ambulation/Gait Ambulation/Gait assistance: Mod assist Gait Distance (Feet): 10 Feet (+10') Assistive device: 1 person hand held assist Gait Pattern/deviations: Step-to pattern General Gait Details: unsteady with mobility, reaching for things in L hand with R hand in therapist hand. ModA for balance Gait velocity: decreased Gait velocity interpretation: <1.31 ft/sec, indicative of household ambulator    Posture / Balance Balance Overall balance assessment: Needs assistance Sitting-balance support: Feet supported, No upper extremity supported Sitting balance-Leahy Scale: Fair Standing balance support: Single extremity supported, During functional activity Standing balance-Leahy  Scale: Poor Standing balance comment: needs HHA to maintain balance    Special needs/care consideration New HTN Diagnosis   Previous Home Environment  Living Arrangements: Spouse/significant other  Lives With: Spouse Available Help at Discharge: Family, Available 24 hours/day Type of Home: House Home Layout: Two level, Able to live on main level with bedroom/bathroom, Full bath on main level Alternate Level Stairs-Rails: Right Alternate Level Stairs-Number of Steps: flight Home Access: Stairs to enter Entrance Stairs-Rails: Right, Left Entrance Stairs-Number of Steps: 3 Bathroom Shower/Tub: Chiropodist: Handicapped height Bathroom Accessibility: No Home Care Services: No Additional Comments: husband has had multiple knee surgeries, walks with [facility] with affected gait  Discharge Living Setting Plans for Discharge Living Setting: Patient's home, Lives with (comment) (spouse) Type of Home at Discharge: House Discharge Home Layout: Two level, Able to live on main level with bedroom/bathroom Alternate Level Stairs-Rails: Right Alternate Level Stairs-Number of Steps: flight Discharge Home Access: Stairs to enter Entrance Stairs-Rails: Right, Left Entrance Stairs-Number of Steps: 3 Discharge Bathroom Shower/Tub: Tub/shower unit Discharge Bathroom Toilet: Handicapped height Discharge Bathroom Accessibility: Yes How Accessible: Accessible via walker Does the patient have any problems obtaining your medications?: No  Social/Family/Support Systems Patient Roles: Spouse Contact Information: spouse, Tommy Anticipated Caregiver: spouse , son and dsughter Anticipated Ambulance person Information: see contacts Ability/Limitations of Caregiver: spouse uses SPC with affected gait from old CVA Caregiver Availability: 24/7 Discharge Plan Discussed with Primary Caregiver: Yes Is Caregiver In Agreement with Plan?: Yes Does Caregiver/Family have Issues with  Lodging/Transportation while Pt is in Rehab?: No  Goals Patient/Family Goal for Rehab: supervision to intermittent min assist with PT and OT, supervision with SLP Expected length of stay: ELOS 14 to 20 days Pt/Family Agrees to Admission and willing to participate: Yes Program Orientation Provided & Reviewed with Pt/Caregiver Including Roles  & Responsibilities: Yes  Decrease burden of Care through IP rehab admission: n/a  Possible need for SNF placement upon discharge: not anticipated  Patient Condition: I have reviewed medical records from [facility], spoken with CM, and patient and spouse. I met with patient at the bedside for inpatient rehabilitation assessment.  Patient will benefit from ongoing PT, OT, and SLP, can actively participate in 3 hours of therapy a day 5 days of the week, and can make measurable gains during the admission.  Patient will also benefit from the coordinated team approach during an Inpatient Acute  Rehabilitation admission.  The patient will receive intensive therapy as well as Rehabilitation physician, nursing, social worker, and care management interventions.  Due to bladder management, bowel management, safety, skin/wound care, disease management, medication administration, pain management, and patient education the patient requires 24 hour a day rehabilitation nursing.  The patient is currently min to mod assist overall with mobility and basic ADLs.  Discharge setting and therapy post discharge at home with home health is anticipated.  Patient has agreed to participate in the Acute Inpatient Rehabilitation Program and will admit today.  Preadmission Screen Completed By:  Cleatrice Burke, 06/07/2021 2:17 PM ______________________________________________________________________   Discussed status with Dr. Ranell Patrick on 06/07/2021 at 1418 and received approval for admission today.  Admission Coordinator:  Cleatrice Burke, RN, time 5502 Date 06/07/2021    Assessment/Plan: Diagnosis: ICH Does the need for close, 24 hr/day Medical supervision in concert with the patient's rehab needs make it unreasonable for this patient to be served in a less intensive setting? Yes Co-Morbidities requiring supervision/potential complications: left femur fracture, HLD, facial pain syndrome, tremor Due to bladder management, bowel management, safety, skin/wound care, disease management, medication administration, pain management, and patient education, does the patient require 24 hr/day rehab nursing? Yes Does the patient require coordinated care of a physician, rehab nurse, PT, OT, and SLP to address physical and functional deficits in the context of the above medical diagnosis(es)? Yes Addressing deficits in the following areas: balance, endurance, locomotion, strength, transferring, bowel/bladder control, bathing, dressing, feeding, grooming, toileting, speech, and psychosocial support Can the patient actively participate in an intensive therapy program of at least 3 hrs of therapy 5 days a week? Yes The potential for patient to make measurable gains while on inpatient rehab is excellent Anticipated functional outcomes upon discharge from inpatient rehab: supervision PT, supervision OT, supervision SLP Estimated rehab length of stay to reach the above functional goals is: 10-14 days Anticipated discharge destination: Home 10. Overall Rehab/Functional Prognosis: excellent   MD Signature: Leeroy Cha, MD

## 2021-06-06 NOTE — Evaluation (Signed)
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   Displaced intertrochanteric fracture of left femur, initial encounter for closed fracture ([place]) 04/25/2017   HLD (hyperlipidemia) 04/25/2017   Past Medical History:  Past Medical History:  Diagnosis Date   BCC (basal cell carcinoma) 02/09/1988   left upper thigh,left lower thigh tx cx3 55fu   BCC (basal cell carcinoma) 10/04/1962   left clavicle,left breast, Back   BCC (basal cell carcinoma) 03/06/1989   mid back   BCC (basal cell carcinoma) 04/02/1990   left upper shoulder (CX35FU),left upper breast(CX35FU),above left clavicle, right outer back,   BCC (basal cell carcinoma) 04/01/1991   right cetner outer brown (CX3+exc. ), upper left chest,upper left chest medial, right back   BCC (basal cell carcinoma) 02/01/1994   right back   BCC (basal cell carcinoma) 03/26/1995   central upper back (CX35FU),left upper back (CX35FU)   BCC (basal cell carcinoma) 12/30/1997   right cheek (exc. )   BCC (basal cell carcinoma) 08/06/1999   upper right shin (CX35FU), upper right shin (CX35FU)   BCC (basal cell carcinoma) 08/24/1998   left breast (CX35FU)   BCC (basal cell carcinoma) 08/05/2001   right inner cheek (MOHS), Left upper back (CX35FU),right upper back (CX35FU),, right top hand (CX35FU)   BCC (basal cell carcinoma) 08/02/2002   upper back(CX35FU)   BCC (basal cell carcinoma) 09/12/2003   right mid back (CX35FU), right thigh (deep freeze+aldara) Left thigh outer (deep freeze +aldara)   BCC (basal cell carcinoma) 06/03/2005   rigth scapula (CX35FU),    BCC (basal cell carcinoma) 06/05/2006   right ant. 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 (CX35FU)   SCC (squamous cell carcinoma) 06/05/2006   mid chest (CX35FU)   SCC (squamous cell carcinoma) 08/08/2010   left temple (CX35FU), Left upper arm (CX35FU), right forearm/wrist (CX35FU)   SCC (squamous cell carcinoma) 02/06/2012   Left post neck-tx p bx   SCC (squamous cell carcinoma) 11/09/2012   left hand-tx p bx-, left chest sup -tx p bx, left chest inf. (CX35FU)   SCC (squamous cell carcinoma) 08/04/2014   left clavicle-tx p bx   SCC (squamous cell carcinoma) 11/30/2014   lower right leg distal (CX35FU), lower right leg, prox. (CX35FU)   SCC (squamous cell carcinoma) 05/17/2015   right jawline (CX35FU), Left sideburn (CX35FU)   SCC (squamous cell carcinoma) 09/10/2017   left forearm-tx p bx, left temple -tx p bx   Past Surgical History:  Past Surgical History:  Procedure Laterality Date   COLONOSCOPY  01/01/2012   Procedure: COLONOSCOPY;  Surgeon: Rogene Houston, MD;  [facility];  Service: Endoscopy;  Laterality: N/A;  830   INTRAMEDULLARY (IM) NAIL INTERTROCHANTERIC Left 04/26/2017   Procedure: INTRAMEDULLARY (IM) NAIL INTERTROCHANTRIC;  Surgeon: Leandrew Koyanagi, MD;  Location: [facility];  Service: Orthopedics;  Laterality: Left;   KNEE SURGERY     ORIF TIBIA PLATEAU     HPI:  86yo female admitted 06/04/21 with right facial droop, dysarthria, confusion. PMH: basal cell carcinoma, squamous cell carcinoma, trigeminal  neuralgia, HLD. MRI = Previously identified intraparenchymal hematoma centered at the anterior left basal ganglia of is grossly stable in size as compared to previous   Assessment / Plan / Recommendation Clinical Impression  Pt presents with very mild right orofacial weakness which does not interfere with intelligibility. Pt reports sensation is intact. Pt also presents with mild receptive and expressive language impairment. Receptively, she is able to answer simple/concrete yes/no questions and follow most 1- and 2-step verbal directions. She has difficulty with complex/abstract yes/no questions and multi-step verbal directions. Right/Left discrimination appears intact. She is successful with body part and object identification. Expressively, pt is able to provide automatic sequences with mild difficulty. She frequently perseverates to the previous task, and exhibits language of confusion. Repetition is intact to the sentence length level. Responsive and confrontation naming tasks are 80% accurate. Pt named 5 animals in 60 seconds (n=15+). SLP will continue to follow pt focused on increasing effective communication and education. Anticipate continued ST services will be beneficial after acute hospitalization, given level of independence prior to admit.    SLP Assessment  SLP Recommendation/Assessment: Patient needs continued Speech Language Pathology Services  SLP Visit Diagnosis: Aphasia (R47.01)    Recommendations for follow up therapy are one component of a multi-disciplinary discharge planning process, led by the attending physician.  Recommendations may be updated based on patient status, additional functional criteria and insurance authorization.    Follow Up Recommendations  Other (comment) (TBD)    Assistance Recommended at Discharge  Frequent or constant Supervision/Assistance  Functional Status Assessment Patient has had a recent decline in their functional status and demonstrates the  ability to make significant improvements in function in a reasonable and predictable amount of time.  Frequency and Duration min 1 x/week  2 weeks      SLP Evaluation Cognition  Overall Cognitive Status: Difficult to assess (due to language deficits) Orientation Level: Oriented to person;Oriented to place;Disoriented to time;Disoriented to situation       Comprehension  Auditory Comprehension Overall Auditory Comprehension: Impaired Yes/No Questions: Impaired Basic Biographical Questions: 76-100% accurate Basic Immediate Environment Questions: 75-100% accurate Complex Questions: 25-49% accurate Commands: Impaired One Step Basic Commands: 75-100% accurate Two Step Basic Commands: 50-74% accurate Multistep Basic Commands: 25-49% accurate Conversation: Simple    Expression Expression Primary Mode of Expression: Verbal Verbal Expression Overall Verbal Expression: Impaired Initiation: No impairment Automatic Speech: Name;Counting;Day of week;Month of year (perseverative) Level of Generative/Spontaneous Verbalization: Sentence Repetition: No impairment Naming: Impairment Responsive: 76-100% accurate Confrontation: Impaired Convergent: 75-100% accurate Divergent: 75-100% accurate Verbal Errors: Perseveration;Not aware of errors;Language of confusion Pragmatics: No impairment Non-Verbal Means of Communication: Not applicable Written Expression Dominant Hand: Right   Oral / Motor  Oral Motor/Sensory Function Overall Oral Motor/Sensory Function: Mild impairment Facial ROM: Reduced right Facial Symmetry: Abnormal symmetry right Facial Strength: Reduced right Facial Sensation: Within Functional Limits Lingual ROM: Reduced right Lingual Symmetry: Abnormal symmetry right Lingual Strength: Reduced Lingual Sensation: Within Functional Limits Velum: Within Functional Limits Mandible: Within Functional Limits Motor Speech Overall Motor Speech: Appears within functional limits  for tasks assessed  Respiration: Within functional limits Articulation: Within functional limitis Intelligibility: Intelligible           Govanni Plemons B. Quentin Ore, [facility], [place] Speech Language Pathologist Office: [phone]  Shonna Chock 06/06/2021, 10:53 AM

## 2021-06-06 NOTE — Progress Notes (Addendum)
STROKE TEAM PROGRESS NOTE    INTERVAL HISTORY Patient is seen in her room with her husband at the bedside.  She is oriented to self and place and able to follow simple commands.  She has been hemodynamically stable and has no changes in her neurological exam overnight.  Vitals:   06/06/21 1100 06/06/21 1200 06/06/21 1300 06/06/21 1400  BP: 117/72 129/68 (!) 127/107 132/70  Pulse: 69 70 (!) 104 81  Resp: 17 18 18 19   Temp:  98.3 F (36.8 C)    TempSrc:  Oral    SpO2: 94% 93% 92% 95%  Weight:      Height:       CBC:  Recent Labs  Lab 06/04/21 1700 06/04/21 1743  WBC 6.5  --   NEUTROABS 4.4  --   HGB 12.9 13.3  HCT 39.3 39.0  MCV 92.9  --   PLT 210  --     Basic Metabolic Panel:  Recent Labs  Lab 06/04/21 1700 06/04/21 1743  NA 135 140  K 3.8 3.5  CL 103 103  CO2 27  --   GLUCOSE 95 96  BUN 17 17  CREATININE 0.73 0.50  CALCIUM 8.6*  --     Lipid Panel:  Recent Labs  Lab 06/06/21 0357  CHOL 127  TRIG 45  HDL 62  CHOLHDL 2.0  VLDL 9  LDLCALC 56   HgbA1c:  Recent Labs  Lab 06/06/21 0357  HGBA1C 5.5   Urine Drug Screen: No results for input(s): LABOPIA, COCAINSCRNUR, LABBENZ, AMPHETMU, THCU, LABBARB in the last 168 hours.  Alcohol Level No results for input(s): ETH in the last 168 hours.  IMAGING past 24 hours MR ANGIO HEAD WO CONTRAST  Result Date: 06/05/2021 CLINICAL DATA:  Follow-up examination for stroke. EXAM: MRI HEAD WITHOUT CONTRAST MRA HEAD WITHOUT CONTRAST TECHNIQUE: Multiplanar, multi-echo pulse sequences of the brain and surrounding structures were acquired without intravenous contrast. Angiographic images of the Circle of Willis were acquired using MRA technique without intravenous contrast. COMPARISON:  Prior CT from earlier the same day. FINDINGS: MRI HEAD FINDINGS Brain: Examination moderately to severely degraded by motion artifact. Additionally, the patient was unable to tolerate the full length of the exam. Cerebral volume within normal  limits for age. Moderate chronic microvascular ischemic disease noted involving the periventricular deep white matter both cerebral hemispheres. Previously identified intraparenchymal hematoma centered at the anterior left basal ganglia of is grossly stable in size as compared to previous. Surrounding vasogenic edema and mild regional mass effect. Left lateral ventricle is partially effaced without significant midline shift. No visible underlying lesion on this motion degraded noncontrast MRI. Intraventricular extension with blood within the left greater than right lateral ventricles. Stable ventricular size and morphology without progressive hydrocephalus or trapping. No other evidence for acute or subacute infarct. Gray-white matter differentiation otherwise maintained. No other visible areas of chronic cortical infarction. No other acute intracranial hemorrhage. No other significant chronic blood products elsewhere within the brain. No visible mass lesion or extra-axial fluid collection. Pituitary gland suprasellar region grossly normal. Midline structures intact. Vascular: Major intracranial vascular flow voids are maintained. Skull and upper cervical spine: Craniocervical junction grossly normal. Bone marrow signal intensity within normal limits. No scalp soft tissue abnormality. Sinuses/Orbits: Patient status post bilateral ocular lens replacement. Paranasal sinuses are largely clear. No significant mastoid effusion. Other: None. MRA HEAD FINDINGS Anterior circulation: Examination moderately to severely degraded by motion artifact. Visualized distal cervical segments of the internal carotid arteries are  patent with antegrade flow. Petrous, cavernous, and supraclinoid segments patent without significant stenosis or other visible abnormality. A1 segments patent bilaterally. Normal anterior communicating artery complex. Anterior cerebral arteries limited assessment due to motion, but appear to be grossly patent  to their distal aspects on 3D time-of-flight sequence. No visible M1 stenosis or occlusion. Grossly negative MCA bifurcations. Distal MCA branches grossly perfused and symmetric. Posterior circulation: Right vertebral artery strongly dominant and widely patent to the vertebrobasilar junction. Left vertebral artery hypoplastic and grossly patent as well. Both PICA grossly patent at their origins. Basilar patent to its distal aspect without stenosis. Superior cerebellar arteries patent bilaterally. Right PCA supplied via the basilar. Predominant fetal type origin left PCA. Both PCAs grossly patent to their distal aspects without stenosis. Anatomic variants: Fetal type origin of the left PCA. No visible aneurysm. No visible vascular abnormality seen underlying the left basal ganglia hemorrhage. IMPRESSION: MRI HEAD IMPRESSION: 1. Motion degraded exam. 2. Grossly stable appearance of intraparenchymal hemorrhage centered at the anterior left basal ganglia with surrounding vasogenic edema and mild regional mass effect. No visible underlying lesion on this motion degraded noncontrast MRI. 3. Intraventricular extension with blood within the left greater than right lateral ventricles. Stable ventricular size and morphology without progressive hydrocephalus or trapping. 4. No other acute intracranial abnormality. MRA HEAD IMPRESSION: 1. Technically limited exam due to extensive motion artifact. 2. Grossly negative intracranial MRA, with no large vessel occlusion, hemodynamically significant stenosis, or other acute vascular abnormality. No visible vascular abnormality seen underlying the left basal ganglia hemorrhage. No visible aneurysm. Electronically Signed   By: Jeannine Boga M.D.   On: 06/05/2021 21:42   MR BRAIN WO CONTRAST  Result Date: 06/05/2021 CLINICAL DATA:  Follow-up examination for stroke. EXAM: MRI HEAD WITHOUT CONTRAST MRA HEAD WITHOUT CONTRAST TECHNIQUE: Multiplanar, multi-echo pulse sequences of  the brain and surrounding structures were acquired without intravenous contrast. Angiographic images of the Circle of Willis were acquired using MRA technique without intravenous contrast. COMPARISON:  Prior CT from earlier the same day. FINDINGS: MRI HEAD FINDINGS Brain: Examination moderately to severely degraded by motion artifact. Additionally, the patient was unable to tolerate the full length of the exam. Cerebral volume within normal limits for age. Moderate chronic microvascular ischemic disease noted involving the periventricular deep white matter both cerebral hemispheres. Previously identified intraparenchymal hematoma centered at the anterior left basal ganglia of is grossly stable in size as compared to previous. Surrounding vasogenic edema and mild regional mass effect. Left lateral ventricle is partially effaced without significant midline shift. No visible underlying lesion on this motion degraded noncontrast MRI. Intraventricular extension with blood within the left greater than right lateral ventricles. Stable ventricular size and morphology without progressive hydrocephalus or trapping. No other evidence for acute or subacute infarct. Gray-white matter differentiation otherwise maintained. No other visible areas of chronic cortical infarction. No other acute intracranial hemorrhage. No other significant chronic blood products elsewhere within the brain. No visible mass lesion or extra-axial fluid collection. Pituitary gland suprasellar region grossly normal. Midline structures intact. Vascular: Major intracranial vascular flow voids are maintained. Skull and upper cervical spine: Craniocervical junction grossly normal. Bone marrow signal intensity within normal limits. No scalp soft tissue abnormality. Sinuses/Orbits: Patient status post bilateral ocular lens replacement. Paranasal sinuses are largely clear. No significant mastoid effusion. Other: None. MRA HEAD FINDINGS Anterior circulation:  Examination moderately to severely degraded by motion artifact. Visualized distal cervical segments of the internal carotid arteries are patent with antegrade flow. Petrous, cavernous, and  supraclinoid segments patent without significant stenosis or other visible abnormality. A1 segments patent bilaterally. Normal anterior communicating artery complex. Anterior cerebral arteries limited assessment due to motion, but appear to be grossly patent to their distal aspects on 3D time-of-flight sequence. No visible M1 stenosis or occlusion. Grossly negative MCA bifurcations. Distal MCA branches grossly perfused and symmetric. Posterior circulation: Right vertebral artery strongly dominant and widely patent to the vertebrobasilar junction. Left vertebral artery hypoplastic and grossly patent as well. Both PICA grossly patent at their origins. Basilar patent to its distal aspect without stenosis. Superior cerebellar arteries patent bilaterally. Right PCA supplied via the basilar. Predominant fetal type origin left PCA. Both PCAs grossly patent to their distal aspects without stenosis. Anatomic variants: Fetal type origin of the left PCA. No visible aneurysm. No visible vascular abnormality seen underlying the left basal ganglia hemorrhage. IMPRESSION: MRI HEAD IMPRESSION: 1. Motion degraded exam. 2. Grossly stable appearance of intraparenchymal hemorrhage centered at the anterior left basal ganglia with surrounding vasogenic edema and mild regional mass effect. No visible underlying lesion on this motion degraded noncontrast MRI. 3. Intraventricular extension with blood within the left greater than right lateral ventricles. Stable ventricular size and morphology without progressive hydrocephalus or trapping. 4. No other acute intracranial abnormality. MRA HEAD IMPRESSION: 1. Technically limited exam due to extensive motion artifact. 2. Grossly negative intracranial MRA, with no large vessel occlusion, hemodynamically  significant stenosis, or other acute vascular abnormality. No visible vascular abnormality seen underlying the left basal ganglia hemorrhage. No visible aneurysm. Electronically Signed   By: Jeannine Boga M.D.   On: 06/05/2021 21:42    PHYSICAL EXAM  Physical Exam  Constitutional: Appears well-developed and well-nourished.    Neuro: Mental Status: Patient is awake, alert, oriented to person and place. Transcortical expressive aphasia, difficulty answering questions related to time and situation   Cranial Nerves: II: Visual Fields are full. Pupils are equal, round, and reactive to light.   III,IV, VI: EOMI without ptosis  V: Facial sensation is symmetric to temperature VII: Slight right facial droop VIII: Hearing is intact to voice XII: Tongue protrudes midline without atrophy or fasciculations.  Motor: 5/5 strength was present in all four extremities.  Sensory: Sensation is symmetric to light touch  in the arms and legs.   ASSESSMENT/PLAN Shelby Hill is a 86 y.o. female with history of basal cell carcinoma, squamous cell carcinoma, trigeminal neuralgia and HLD presenting with acute onset of right facial droop, garbled speech and confusion. LKN was 1:30 PM while getting a facial at a salon. Staff then noticed patient to be acutely confused and answering questions inappropriately. Technician thought that she was talking in her sleep during the facial; after the facial was done, she contacted the patient's husband. EMS was called and on arrival they noted slurred speech and confusion. CT showed IPH centered in the Lt basal ganglia, mild surrounding edema, intraventricular extension into the left later, third, and fourth ventricle. PT recommends CIR. Patient has transfer orders placed. MRI/MRA pending for further imaging on her brain to narrow down cause of hemorrhage.  ICH:   Left BG IPH with IVH likely secondary hypertensive source Code Stroke : Intraparenchymal hemorrhage,  centered in the left basal ganglia, with mild surrounding edema but without significant mass effect or midline shift. Intraventricular extension into the left lateral ventricle, third ventricle, and fourth ventricle. No hydrocephalus Repeat CT- unchanged IPH MRI  Stable IPH in anterior left basal ganglia with IVH, no other acute abnormality MRA  no LVO, hemodynamically significant stenosis and no vascular abnormality underlying left basal ganglia IPH LDL 56 HgbA1c 5.5 VTE prophylaxis - SCDs aspirin 81 mg daily prior to admission, now on No antithrombotic. Given IPH Therapy recommendations:  CIR Disposition:  pending  Hypertension Home meds:  None Stable BP systolic [id number] Cleviprex infusion if needed  Hyperlipidemia Home meds:  Crestor LDL 56 Continue statin at discharge  Diabetes type II  Home meds:  None HgbA1c 5.5  Other Stroke Risk Factors Advanced Age >/= 52   Other Active Problems Trigeminal Neuralgia- sees [facility] outpatient  Lamictal, duloxetine, and lyrica Previously tried to stop lyrica but had an increase in nerve pain   Hospital day # 2  Patient seen and examined by NP/APP with MD. MD to update note as needed.   [place] , MSN, AGACNP-BC [facility] See Amion for schedule and pager information 06/06/2021 2:23 PM   ATTENDING NOTE: I reviewed above note and agree with the assessment and plan. Pt was seen and examined.   No family at bedside.  Speech therapist is working with her.  Still has expressive aphasia but otherwise neuro stable.  BP stable, under control.  PT/OT recommend CIR.  For detailed assessment and plan, please refer to above as I have made changes wherever appropriate.   Rosalin Hawking, MD PhD Stroke Neurology 06/06/2021 8:18 PM  This patient is critically ill due to [place] with IVH, hypertension, and at significant risk of neurological worsening, death form hematoma expansion, hydrocephalus, cerebral edema, brain  herniation. This patient's care requires constant monitoring of vital signs, hemodynamics, respiratory and cardiac monitoring, review of multiple databases, neurological assessment, discussion with family, other specialists and medical decision making of high complexity. I spent 30 minutes of neurocritical care time in the care of this patient.    To contact Stroke Continuity provider, please refer to [email or web address] After hours, contact General Neurology

## 2021-06-06 NOTE — Progress Notes (Signed)
Physical Therapy Treatment Patient Details Name: Shelby Hill MRN: [id number] DOB: February 10, 1936 Today's Date: 06/06/2021   History of Present Illness Pt is 86 yo female who presents with R facial droop, slurred speech, and confusion. 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  PMH: basal cell carcinoma, squamous cell carcinoma, trigeminal neuralgia, HLD    PT Comments    Patient progressing towards physical therapy goals. Patient continues to be limited by expressive aphasia and weakness. Patient requires modA for ambulation in room with HHA, however patient reaching for objects on opposite side. Patient would benefit from use of RW next session to improve stability. Continue to recommend CIR level therapies to assist with maximizing functional mobility and safety.     Recommendations for follow up therapy are one component of a multi-disciplinary discharge planning process, led by the attending physician.  Recommendations may be updated based on patient status, additional functional criteria and insurance authorization.  Follow Up Recommendations  Acute inpatient rehab (3hours/day)     Assistance Recommended at Discharge Frequent or constant Supervision/Assistance  Patient can return home with the following A lot of help with walking and/or transfers;A lot of help with bathing/dressing/bathroom;Assistance with cooking/housework;Direct supervision/assist for financial management;Direct supervision/assist for medications management;Assist for transportation;Help with stairs or ramp for entrance   Equipment Recommendations  None recommended by PT    Recommendations for Other Services       Precautions / Restrictions Precautions Precautions: Fall Precaution Comments: R knee problems recently Restrictions Weight Bearing Restrictions: No     Mobility  Bed Mobility Overal bed mobility: Needs Assistance Bed Mobility: Supine to Sit     Supine to sit: Min  assist     General bed mobility comments: minA to reposition hips towards EOB    Transfers Overall transfer level: Needs assistance Equipment used: 1 person hand held assist Transfers: Sit to/from Stand Sit to Stand: Mod assist           General transfer comment: therapist placed foot in front of patient's R foot due to patient's foot kept sliding fwd. ModA for boost up into standing    Ambulation/Gait Ambulation/Gait assistance: Mod assist Gait Distance (Feet): 10 Feet (+10') Assistive device: 1 person hand held assist Gait Pattern/deviations: Step-to pattern Gait velocity: decreased     General Gait Details: unsteady with mobility, reaching for things in L hand with R hand in therapist hand. ModA for balance   Stairs             Wheelchair Mobility    Modified Rankin (Stroke Patients Only) Modified Rankin (Stroke Patients Only) Pre-Morbid Rankin Score: No symptoms Modified Rankin: Moderately severe disability     Balance Overall balance assessment: Needs assistance Sitting-balance support: Feet supported;No upper extremity supported Sitting balance-Leahy Scale: Fair     Standing balance support: Single extremity supported;During functional activity Standing balance-Leahy Scale: Poor Standing balance comment: needs HHA to maintain balance                            Cognition Arousal/Alertness: Awake/alert Behavior During Therapy: WFL for tasks assessed/performed Overall Cognitive Status: Difficult to assess                                 General Comments: follows simple 1 step commands with delay but seems to understand at times. Does better when given choices rather than open  ended questions. Disoriented to place and situaiton but able to answer month and year when provided choices        Exercises      General Comments General comments (skin integrity, edema, etc.): VSS on RA      Pertinent Vitals/Pain Pain  Assessment: Faces Faces Pain Scale: No hurt Pain Intervention(s): Monitored during session    Home Living                          Prior Function            PT Goals (current goals can now be found in the care plan section) Acute Rehab PT Goals Patient Stated Goal: return home, be able to get words out PT Goal Formulation: With patient/family Time For Goal Achievement: 06/19/21 Potential to Achieve Goals: Good Progress towards PT goals: Progressing toward goals    Frequency    Min 4X/week      PT Plan Current plan remains appropriate    Co-evaluation              AM-PAC PT "6 Clicks" Mobility   Outcome Measure  Help needed turning from your back to your side while in a flat bed without using bedrails?: A Little Help needed moving from lying on your back to sitting on the side of a flat bed without using bedrails?: A Little Help needed moving to and from a bed to a chair (including a wheelchair)?: A Little Help needed standing up from a chair using your arms (e.g., wheelchair or bedside chair)?: A Lot Help needed to walk in hospital room?: A Lot Help needed climbing 3-5 steps with a railing? : Total 6 Click Score: 14    End of Session Equipment Utilized During Treatment: Gait belt Activity Tolerance: Patient tolerated treatment well Patient left: in chair;with call bell/phone within reach;with chair alarm set;with family/visitor present Nurse Communication: Mobility status PT Visit Diagnosis: Unsteadiness on feet (R26.81);Difficulty in walking, not elsewhere classified (R26.2)     Time: 1610-9604 PT Time Calculation (min) (ACUTE ONLY): 36 min  Charges:  $Gait Training: 23-37 mins                     Renika Shiflet A. Gilford Rile PT, DPT Acute Rehabilitation Services Pager [phone] Office [phone]    Linna Hoff 06/06/2021, 3:52 PM

## 2021-06-07 ENCOUNTER — Other Ambulatory Visit: Payer: Self-pay

## 2021-06-07 ENCOUNTER — Inpatient Hospital Stay (HOSPITAL_COMMUNITY)
Admission: RE | Admit: 2021-06-07 | Discharge: 2021-06-22 | DRG: 056 | Disposition: A | Discharge status: Home Health | Payer: Medicare PPO | Source: Intra-hospital | Attending: Physical Medicine & Rehabilitation | Admitting: Physical Medicine & Rehabilitation

## 2021-06-07 ENCOUNTER — Encounter (HOSPITAL_COMMUNITY): Payer: Self-pay | Admitting: Physical Medicine & Rehabilitation

## 2021-06-07 DIAGNOSIS — Z8249 Family history of ischemic heart disease and other diseases of the circulatory system: Secondary | ICD-10-CM | POA: Diagnosis not present

## 2021-06-07 DIAGNOSIS — I610 Nontraumatic intracerebral hemorrhage in hemisphere, subcortical: Secondary | ICD-10-CM | POA: Diagnosis not present

## 2021-06-07 DIAGNOSIS — F05 Delirium due to known physiological condition: Secondary | ICD-10-CM | POA: Diagnosis not present

## 2021-06-07 DIAGNOSIS — I10 Essential (primary) hypertension: Secondary | ICD-10-CM | POA: Diagnosis present

## 2021-06-07 DIAGNOSIS — I69122 Dysarthria following nontraumatic intracerebral hemorrhage: Secondary | ICD-10-CM

## 2021-06-07 DIAGNOSIS — R4182 Altered mental status, unspecified: Secondary | ICD-10-CM | POA: Diagnosis not present

## 2021-06-07 DIAGNOSIS — G936 Cerebral edema: Secondary | ICD-10-CM | POA: Diagnosis not present

## 2021-06-07 DIAGNOSIS — Z741 Need for assistance with personal care: Secondary | ICD-10-CM | POA: Diagnosis present

## 2021-06-07 DIAGNOSIS — E119 Type 2 diabetes mellitus without complications: Secondary | ICD-10-CM | POA: Diagnosis not present

## 2021-06-07 DIAGNOSIS — R4189 Other symptoms and signs involving cognitive functions and awareness: Secondary | ICD-10-CM | POA: Diagnosis present

## 2021-06-07 DIAGNOSIS — E785 Hyperlipidemia, unspecified: Secondary | ICD-10-CM | POA: Diagnosis not present

## 2021-06-07 DIAGNOSIS — I619 Nontraumatic intracerebral hemorrhage, unspecified: Secondary | ICD-10-CM | POA: Diagnosis present

## 2021-06-07 DIAGNOSIS — Z781 Physical restraint status: Secondary | ICD-10-CM | POA: Diagnosis not present

## 2021-06-07 DIAGNOSIS — Z85828 Personal history of other malignant neoplasm of skin: Secondary | ICD-10-CM | POA: Diagnosis not present

## 2021-06-07 DIAGNOSIS — I69198 Other sequelae of nontraumatic intracerebral hemorrhage: Secondary | ICD-10-CM | POA: Diagnosis not present

## 2021-06-07 DIAGNOSIS — Z79899 Other long term (current) drug therapy: Secondary | ICD-10-CM | POA: Diagnosis not present

## 2021-06-07 DIAGNOSIS — M25561 Pain in right knee: Secondary | ICD-10-CM | POA: Diagnosis present

## 2021-06-07 DIAGNOSIS — G500 Trigeminal neuralgia: Secondary | ICD-10-CM | POA: Diagnosis not present

## 2021-06-07 DIAGNOSIS — Z885 Allergy status to narcotic agent status: Secondary | ICD-10-CM

## 2021-06-07 DIAGNOSIS — F419 Anxiety disorder, unspecified: Secondary | ICD-10-CM | POA: Diagnosis present

## 2021-06-07 DIAGNOSIS — E440 Moderate protein-calorie malnutrition: Secondary | ICD-10-CM | POA: Diagnosis not present

## 2021-06-07 DIAGNOSIS — I69120 Aphasia following nontraumatic intracerebral hemorrhage: Secondary | ICD-10-CM | POA: Diagnosis not present

## 2021-06-07 DIAGNOSIS — Z823 Family history of stroke: Secondary | ICD-10-CM

## 2021-06-07 DIAGNOSIS — G250 Essential tremor: Secondary | ICD-10-CM | POA: Diagnosis present

## 2021-06-07 DIAGNOSIS — R451 Restlessness and agitation: Secondary | ICD-10-CM | POA: Diagnosis not present

## 2021-06-07 MED ORDER — ROSUVASTATIN CALCIUM 5 MG PO TABS
5.0000 mg | ORAL_TABLET | Freq: Every day | ORAL | Status: DC
  Administered 2021-06-08 – 2021-06-22 (×15): 5 mg via ORAL
  Filled 2021-06-07 (×15): qty 1

## 2021-06-07 MED ORDER — CALCITONIN (SALMON) 200 UNIT/ACT NA SOLN
1.0000 | Freq: Every day | NASAL | Status: DC
  Administered 2021-06-08 – 2021-06-22 (×14): 1 via NASAL
  Filled 2021-06-07: qty 3.7

## 2021-06-07 MED ORDER — LAMOTRIGINE 25 MG PO TABS
25.0000 mg | ORAL_TABLET | Freq: Two times a day (BID) | ORAL | Status: DC
  Administered 2021-06-07 – 2021-06-22 (×29): 25 mg via ORAL
  Filled 2021-06-07 (×30): qty 1

## 2021-06-07 MED ORDER — PROCHLORPERAZINE MALEATE 5 MG PO TABS: 5.0000 mg | ORAL_TABLET | Freq: Four times a day (QID) | ORAL | Status: DC | PRN

## 2021-06-07 MED ORDER — PROCHLORPERAZINE 25 MG RE SUPP
12.5000 mg | Freq: Four times a day (QID) | RECTAL | Status: DC | PRN
  Filled 2021-06-07: qty 1

## 2021-06-07 MED ORDER — ACETAMINOPHEN 325 MG PO TABS: 325.0000 mg | ORAL_TABLET | ORAL | Status: DC | PRN

## 2021-06-07 MED ORDER — DULOXETINE HCL 30 MG PO CPEP
60.0000 mg | ORAL_CAPSULE | Freq: Every day | ORAL | Status: DC
  Administered 2021-06-08 – 2021-06-22 (×15): 60 mg via ORAL
  Filled 2021-06-07 (×15): qty 2

## 2021-06-07 MED ORDER — BISACODYL 5 MG PO TBEC
5.0000 mg | DELAYED_RELEASE_TABLET | Freq: Every day | ORAL | Status: DC | PRN
  Administered 2021-06-09 – 2021-06-16 (×2): 5 mg via ORAL
  Filled 2021-06-07 (×2): qty 1

## 2021-06-07 MED ORDER — ALUM & MAG HYDROXIDE-SIMETH 200-200-20 MG/5ML PO SUSP: 30.0000 mL | ORAL | Status: DC | PRN

## 2021-06-07 MED ORDER — CALCITONIN (SALMON) 200 UNIT/ACT NA SOLN
1.0000 | Freq: Every day | NASAL | Status: DC
  Filled 2021-06-07: qty 3.7

## 2021-06-07 MED ORDER — GUAIFENESIN-DM 100-10 MG/5ML PO SYRP: 5.0000 mL | ORAL_SOLUTION | Freq: Four times a day (QID) | ORAL | Status: DC | PRN

## 2021-06-07 MED ORDER — POLYETHYLENE GLYCOL 3350 17 G PO PACK
17.0000 g | PACK | Freq: Every day | ORAL | Status: DC | PRN
  Administered 2021-06-08 – 2021-06-17 (×3): 17 g via ORAL
  Filled 2021-06-07 (×3): qty 1

## 2021-06-07 MED ORDER — ALPRAZOLAM 0.25 MG PO TABS
0.2500 mg | ORAL_TABLET | Freq: Every evening | ORAL | Status: DC | PRN
  Administered 2021-06-07 – 2021-06-16 (×6): 0.25 mg via ORAL
  Filled 2021-06-07 (×8): qty 1

## 2021-06-07 MED ORDER — ROSUVASTATIN CALCIUM 5 MG PO TABS: 5.0000 mg | ORAL_TABLET | Freq: Every day | ORAL | Status: DC

## 2021-06-07 MED ORDER — PROCHLORPERAZINE EDISYLATE 10 MG/2ML IJ SOLN
5.0000 mg | Freq: Four times a day (QID) | INTRAMUSCULAR | Status: DC | PRN
  Filled 2021-06-07: qty 2

## 2021-06-07 MED ORDER — FLEET ENEMA 7-19 GM/118ML RE ENEM: 1.0000 | ENEMA | Freq: Once | RECTAL | Status: DC | PRN

## 2021-06-07 MED ORDER — PREGABALIN 25 MG PO CAPS
25.0000 mg | ORAL_CAPSULE | Freq: Two times a day (BID) | ORAL | Status: DC
  Administered 2021-06-07 – 2021-06-22 (×29): 25 mg via ORAL
  Filled 2021-06-07 (×30): qty 1

## 2021-06-07 MED ORDER — PROPRANOLOL HCL 10 MG PO TABS
10.0000 mg | ORAL_TABLET | Freq: Every day | ORAL | Status: DC
  Administered 2021-06-07: 10 mg via ORAL
  Filled 2021-06-07: qty 1

## 2021-06-07 MED ORDER — SENNOSIDES-DOCUSATE SODIUM 8.6-50 MG PO TABS
1.0000 | ORAL_TABLET | Freq: Two times a day (BID) | ORAL | 0 refills | Status: DC
Start: 2021-06-07 — End: 2021-06-22

## 2021-06-07 MED ORDER — ACETAMINOPHEN 325 MG PO TABS: 650.0000 mg | ORAL_TABLET | ORAL | 0 refills | Status: DC | PRN

## 2021-06-07 MED ORDER — PANTOPRAZOLE SODIUM 40 MG PO TBEC
40.0000 mg | DELAYED_RELEASE_TABLET | Freq: Every day | ORAL | Status: DC
  Administered 2021-06-07 – 2021-06-21 (×14): 40 mg via ORAL
  Filled 2021-06-07 (×15): qty 1

## 2021-06-07 NOTE — Plan of Care (Signed)
Pt is alert x 1, pt has tremors and spastic. Pt has abdominal restraint. Pt assessed. Pt denies pain. Pt has voided x 2. Pt is unsteady when moving around. Requires +1 assist.   Problem: Education: Goal: Knowledge of disease or condition will improve Outcome: Progressing Goal: Knowledge of secondary prevention will improve (SELECT ALL) Outcome: Progressing Goal: Knowledge of patient specific risk factors will improve (INDIVIDUALIZE FOR PATIENT) Outcome: Progressing Goal: Individualized Educational Video(s) Outcome: Progressing   Problem: Coping: Goal: Will verbalize positive feelings about self Outcome: Progressing Goal: Will identify appropriate support needs Outcome: Progressing   Problem: Health Behavior/Discharge Planning: Goal: Ability to manage health-related needs will improve Outcome: Progressing   Problem: Self-Care: Goal: Ability to participate in self-care as condition permits will improve Outcome: Progressing Goal: Verbalization of feelings and concerns over difficulty with self-care will improve Outcome: Progressing Goal: Ability to communicate needs accurately will improve Outcome: Progressing   Problem: Nutrition: Goal: Risk of aspiration will decrease Outcome: Progressing Goal: Dietary intake will improve Outcome: Progressing   Problem: Intracerebral Hemorrhage Tissue Perfusion: Goal: Complications of Intracerebral Hemorrhage will be minimized Outcome: Progressing   Problem: Safety: Goal: Non-violent Restraint(s) Outcome: Progressing

## 2021-06-07 NOTE — Progress Notes (Signed)
Occupational Therapy Treatment Patient Details Name: Shelby Hill MRN: [id number] DOB: [birth year] Today's Date: 06/07/2021   History of present illness Pt is 86 yo female who presents with R facial droop, slurred speech, and confusion. 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  PMH: basal cell carcinoma, squamous cell carcinoma, trigeminal neuralgia, HLD   OT comments  Pt making good progress with OT goals this session. Overall requiring min A for basic ADL's and safety, as she continues to need frequent verbal cuing for sequencing and overall assist with managing environment and tasks safely. This session some L sided visual deficits were noted during mobility, as pt ran into multiple objects on her L side, despite therapist verbally notifying her that they were there. Pt will continue to benefit from AIR to maximize her progress to independence and reduce caregiver burden. OT will continue to follow acutely.    Recommendations for follow up therapy are one component of a multi-disciplinary discharge planning process, led by the attending physician.  Recommendations may be updated based on patient status, additional functional criteria and insurance authorization.    Follow Up Recommendations  Acute inpatient rehab (3hours/day)    Assistance Recommended at Discharge Frequent or constant Supervision/Assistance  Patient can return home with the following  A little help with walking and/or transfers;A little help with bathing/dressing/bathroom;Assistance with cooking/housework;Direct supervision/assist for medications management;Direct supervision/assist for financial management;Assist for transportation;Help with stairs or ramp for entrance   Equipment Recommendations  None recommended by OT    Recommendations for Other Services      Precautions / Restrictions Precautions Precautions: Fall Precaution Comments: R knee problems recently Restrictions Weight Bearing  Restrictions: No       Mobility Bed Mobility               General bed mobility comments: Up in recliner on entry    Transfers Overall transfer level: Needs assistance Equipment used: Rolling walker (2 wheels) Transfers: Sit to/from Stand Sit to Stand: Min assist           General transfer comment: Min A to assist power up and steading     Balance Overall balance assessment: Needs assistance Sitting-balance support: Feet supported;No upper extremity supported Sitting balance-Leahy Scale: Fair     Standing balance support: Single extremity supported;During functional activity Standing balance-Leahy Scale: Poor Standing balance comment: needs external support to maintain balance                           ADL either performed or assessed with clinical judgement   ADL Overall ADL's : Needs assistance/impaired     Grooming: Wash/dry hands;Minimal assistance;Standing Grooming Details (indicate cue type and reason): Pt needing min-mod verbal cuing to complete task                 Toilet Transfer: Minimal assistance;Ambulation Toilet Transfer Details (indicate cue type and reason): Needs min A due to running into things on L side and verbal cues for safety Toileting- Clothing Manipulation and Hygiene: Minimal assistance Toileting - Clothing Manipulation Details (indicate cue type and reason): Min A for balance and verbal cuing     Functional mobility during ADLs: Minimal assistance;Rolling walker (2 wheels) General ADL Comments: Limited by cognition, weakness, blance, and possible vision deficits.    Extremity/Trunk Assessment              Vision       Perception  Praxis      Cognition Arousal/Alertness: Awake/alert Behavior During Therapy: WFL for tasks assessed/performed Overall Cognitive Status: Difficult to assess                                 General Comments: follows simple 1 step commands consistently.  Does better when given choices rather than open ended questions. Unable to state month or day this session          Exercises     Shoulder Instructions       General Comments VSS on RA    Pertinent Vitals/ Pain       Pain Assessment: No/denies pain  Home Living                                          Prior Functioning/Environment              Frequency  Min 2X/week        Progress Toward Goals  OT Goals(current goals can now be found in the care plan section)  Progress towards OT goals: Progressing toward goals  Acute Rehab OT Goals Patient Stated Goal: to go home OT Goal Formulation: With patient/family Time For Goal Achievement: 06/19/21 Potential to Achieve Goals: Good ADL Goals Pt Will Perform Grooming: with modified independence Pt Will Perform Lower Body Bathing: with modified independence Pt Will Perform Lower Body Dressing: with modified independence Pt Will Transfer to Toilet: with modified independence;ambulating Pt Will Perform Toileting - Clothing Manipulation and hygiene: with modified independence;sit to/from stand Additional ADL Goal #1: Pt will demonstrate anticipatory awareness during ADL tasks  Plan Discharge plan remains appropriate;Frequency remains appropriate    Co-evaluation                 AM-PAC OT "6 Clicks" Daily Activity     Outcome Measure   Help from another person eating meals?: A Little Help from another person taking care of personal grooming?: A Little Help from another person toileting, which includes using toliet, bedpan, or urinal?: A Lot Help from another person bathing (including washing, rinsing, drying)?: A Lot Help from another person to put on and taking off regular upper body clothing?: A Little Help from another person to put on and taking off regular lower body clothing?: A Lot 6 Click Score: 15    End of Session Equipment Utilized During Treatment: Gait belt;Rolling walker (2  wheels)  OT Visit Diagnosis: Unsteadiness on feet (R26.81);Muscle weakness (generalized) (M62.81);Apraxia (R48.2);Other symptoms and signs involving cognitive function;Other symptoms and signs involving the nervous system (R29.898)   Activity Tolerance Patient tolerated treatment well   Patient Left in chair;with call bell/phone within reach;with chair alarm set;with family/visitor present   Nurse Communication Mobility status        Time: 0175-1025 OT Time Calculation (min): 14 min  Charges: OT General Charges $OT Visit: 1 Visit OT Treatments $Therapeutic Activity: 8-22 mins  Caelen Reierson H., OTR/L Acute Rehabilitation  Jaysion Ramseyer Elane Darnita Woodrum 06/07/2021, 2:21 PM

## 2021-06-07 NOTE — Progress Notes (Signed)
Physical Therapy Treatment Patient Details Name: Shelby Hill MRN: [id number] DOB: 04/06/1936 Today's Date: 06/07/2021   History of Present Illness Pt is 86 yo female who presents with R facial droop, slurred speech, and confusion. 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  PMH: basal cell carcinoma, squamous cell carcinoma, trigeminal neuralgia, HLD    PT Comments    Pt making steady progress with mobility. Did well with rollator but struggles with sit to stand transition. Continue to recommend acute inpatient rehab.   Recommendations for follow up therapy are one component of a multi-disciplinary discharge planning process, led by the attending physician.  Recommendations may be updated based on patient status, additional functional criteria and insurance authorization.  Follow Up Recommendations  Acute inpatient rehab (3hours/day)     Assistance Recommended at Discharge Frequent or constant Supervision/Assistance  Patient can return home with the following A lot of help with walking and/or transfers;Help with stairs or ramp for entrance;A lot of help with bathing/dressing/bathroom;Assistance with Landscape architect (4 wheels)    Recommendations for Other Services       Precautions / Restrictions Precautions Precautions: Fall Precaution Comments: R knee problems recently Restrictions Weight Bearing Restrictions: No     Mobility  Bed Mobility Overal bed mobility: Needs Assistance Bed Mobility: Supine to Sit;Sit to Supine     Supine to sit: Min assist Sit to supine: Min assist   General bed mobility comments: Assist to elevate trunk into sitting and bring hips to EOB. Assist to bring legs back into bed returning to supine    Transfers Overall transfer level: Needs assistance Equipment used: Rollator (4 wheels);1 person hand held assist Transfers: Sit to/from Stand Sit to Stand: Mod assist            General transfer comment: Assist to bring hips up and for balance. Pt with tendency to lean posteriorly causing feet to slide forward    Ambulation/Gait Ambulation/Gait assistance: Min assist Gait Distance (Feet): 150 Feet Assistive device: Rollator (4 wheels) Gait Pattern/deviations: Step-through pattern;Decreased stride length;Drifts right/left Gait velocity: decr Gait velocity interpretation: 1.31 - 2.62 ft/sec, indicative of limited community ambulator   General Gait Details: Assist for balance and support. Slight limp on lt - likely due to ORIF from 2018.   Stairs             Wheelchair Mobility    Modified Rankin (Stroke Patients Only) Modified Rankin (Stroke Patients Only) Pre-Morbid Rankin Score: No symptoms Modified Rankin: Moderately severe disability     Balance Overall balance assessment: Needs assistance Sitting-balance support: Feet supported;Single extremity supported;Bilateral upper extremity supported Sitting balance-Leahy Scale: Poor Sitting balance - Comments: Pt with posterior and rt lean requiring either her UE support or min assist   Standing balance support: Single extremity supported;Bilateral upper extremity supported;Reliant on assistive device for balance Standing balance-Leahy Scale: Poor Standing balance comment: rollator and min assist for static standing                            Cognition Arousal/Alertness: Awake/alert Behavior During Therapy: WFL for tasks assessed/performed Overall Cognitive Status: Difficult to assess                                 General Comments: Pt followed 1 step commands. Difficulty sequencing task and requires verbal/tactile cues.  Exercises      General Comments General comments (skin integrity, edema, etc.): VSS on RA      Pertinent Vitals/Pain Pain Assessment: No/denies pain    Home Living                          Prior Function            PT  Goals (current goals can now be found in the care plan section) Acute Rehab PT Goals Patient Stated Goal: return home Progress towards PT goals: Progressing toward goals    Frequency    Min 4X/week      PT Plan Current plan remains appropriate    Co-evaluation              AM-PAC PT "6 Clicks" Mobility   Outcome Measure  Help needed turning from your back to your side while in a flat bed without using bedrails?: A Little Help needed moving from lying on your back to sitting on the side of a flat bed without using bedrails?: A Little Help needed moving to and from a bed to a chair (including a wheelchair)?: A Lot Help needed standing up from a chair using your arms (e.g., wheelchair or bedside chair)?: A Lot Help needed to walk in hospital room?: A Little Help needed climbing 3-5 steps with a railing? : Total 6 Click Score: 14    End of Session Equipment Utilized During Treatment: Gait belt Activity Tolerance: Patient tolerated treatment well Patient left: in bed;with call bell/phone within reach;with bed alarm set;with family/visitor present Nurse Communication: Mobility status PT Visit Diagnosis: Unsteadiness on feet (R26.81);Difficulty in walking, not elsewhere classified (R26.2)     Time: 7897-8478 PT Time Calculation (min) (ACUTE ONLY): 22 min  Charges:  $Gait Training: 8-22 mins                     [place] Pager [phone] Office [place] 06/07/2021, 4:40 PM

## 2021-06-07 NOTE — Progress Notes (Signed)
Shelby Ribas, MD  Physician Physical Medicine and Rehabilitation PMR Pre-admission    Signed Date of Service:  06/06/2021 11:42 AM  Related encounter: ED to Hosp-Admission (Discharged) from 06/04/2021 in [facility]   Signed      Show:Clear all _0 Written_1 Templated_2 Copied  Added by: _3 Shelby Gong, RN_4 Shelby Patrick Clide Deutscher, MD  _5 Hover for details                                                                                                                                                                                                                                                                                                                                                                                                                      PMR Admission Coordinator Pre-Admission Assessment   Patient: Shelby Hill is an 86 y.o., female MRN: [id number] DOB: [birth year] Height: _6  (167.6 cm)Weight: 78.3 kg   Insurance Information HMO: yes    PPO:      PCP:      IPA:      80/20:      OTHER:  PRIMARYJosephine Igo Hill      Policy#: [id number]      Subscriber: pt CM Name: Shelby Hill      Phone#: [phone] Ext [phone] approved for 7 days     Fax#: [phone] Pre-Cert#: [id number]      Employer:  Benefits:  Phone #: [phone]     Name: 1/11 Eff. Date: 05/28/2019  Deduct: none      Out of Pocket Max: $4000      Life Max: none CIR: $160 co pay per day days 1 until 10      SNF: 100% coverage Outpatient: $20 per visit     Co-Pay: visits per medical neccesity Home Health: 100%      Co-Pay: visits per medical neccesity DME: 80%     Co-Pay: 20% Providers: in network  SECONDARY: none   Financial Counselor:       Phone#:    [employer] for patients in Inpatient  Rehabilitation Facilities with attached Privacy Act [place] Records was provided and verbally reviewed with: Patient and Family   Emergency Contact Information Contact Information       Name Relation Home Work Mobile    Shelby Hill Spouse [phone]   [phone]         Current Medical History  Patient Admitting Diagnosis: ICH   History of Present Illness: 86 year old female with medical history of basal cell carcinoma, squamous cell carcinoma, trigeminal neuralgia and HLD with presented on 06/04/2021 with acute onset of right facial droop, garbled speech and confusion. 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 Repeat CT stable hematoma and ventricular size. MRI head stable appearance of IPH centered at the anterior left basal ganglia with surrounding vasogenic edema and mild regional mass effect. No visible underlying lesion. IV extension with blood within the left greater than right lateral ventricles. No other acute intracranial abnormality. MRA head grossly negative intracranial MRA, . EF 60 to 65 % and carotid dopplers negative. Neurology consulted. Etiology of ICH and IVH likely due to HTN. BP controlled with Cleviprex. No prior history of HTN. MRI to rule out underlying tumor or CAA pending. Continue home Lyrica and Lamictal for trigeminal neuralgia. SLP for expressive aphasia. VTE prophylaxis SCDS. Asa 81 mg prior to admission, now on No antithrombotic given IPH.    Complete NIHSS TOTAL: 4   Patient's medical record from [facility] has been reviewed by the rehabilitation admission coordinator and physician.   Past Medical History      Past Medical History:  Diagnosis Date   BCC (basal cell carcinoma) 02/09/1988    left upper thigh,left lower thigh tx cx3 69f   BCC (basal cell carcinoma) 10/04/1962    left clavicle,left breast, Back   BCC (basal cell carcinoma) 03/06/1989    mid back   BCC (basal cell carcinoma) 04/02/1990    left upper shoulder  (CX35FU),left upper breast(CX35FU),above left clavicle, right outer back,   BCC (basal cell carcinoma) 04/01/1991    right cetner outer brown (CX3+exc. ), upper left chest,upper left chest medial, right back   BCC (basal cell carcinoma) 02/01/1994    right back   BCC (basal cell carcinoma) 03/26/1995    central upper back (CX35FU),left upper back (CX35FU)   BCC (basal cell carcinoma) 12/30/1997    right cheek (exc. )   BCC (basal cell carcinoma) 08/06/1999    upper right shin (CX35FU), upper right shin (CX35FU)   BCC (basal cell carcinoma) 08/24/1998    left breast (CX35FU)   BCC (basal cell carcinoma) 08/05/2001    right inner cheek (MOHS), Left upper back (CX35FU),right upper back (CX35FU),, right top hand (CX35FU)   BCC (basal cell carcinoma) 08/02/2002    upper back(CX35FU)   BCC (basal cell carcinoma) 09/12/2003    right mid back (CX35FU), right thigh (deep freeze+aldara) Left thigh outer (deep freeze +aldara)  BCC (basal cell carcinoma) 06/03/2005    rigth scapula (CX35FU),    BCC (basal cell carcinoma) 06/05/2006    right ant. lateral thigh (CX35FU)   BCC (basal cell carcinoma) 08/08/2010    right forearm (CX35FU)   BCC (basal cell carcinoma) 07/01/2011    right shin (MOHS)   BCC (basal cell carcinoma) 12/14/2013    left upper thigh(CX35FU), left upper arm (CX35FU), mid right back (CX35FU), lower right back (CX35FU)   BCC (basal cell carcinoma) 02/28/2016    left forearm   HLD (hyperlipidemia)     SCC (squamous cell carcinoma) 08/11/2000    left nose (MOHS)   SCC (squamous cell carcinoma) 09/12/2003    upper back (CX35FU)   SCC (squamous cell carcinoma) 06/03/2005    left v of neck (CX35FU), Left shin sup. (CX35FU)   SCC (squamous cell carcinoma) 06/05/2006    mid chest (CX35FU)   SCC (squamous cell carcinoma) 08/08/2010    left temple (CX35FU), Left upper arm (CX35FU), right forearm/wrist (CX35FU)   SCC (squamous cell carcinoma) 02/06/2012    Left post neck-tx p bx    SCC (squamous cell carcinoma) 11/09/2012    left hand-tx p bx-, left chest sup -tx p bx, left chest inf. (CX35FU)   SCC (squamous cell carcinoma) 08/04/2014    left clavicle-tx p bx   SCC (squamous cell carcinoma) 11/30/2014    lower right leg distal (CX35FU), lower right leg, prox. (CX35FU)   SCC (squamous cell carcinoma) 05/17/2015    right jawline (CX35FU), Left sideburn (CX35FU)   SCC (squamous cell carcinoma) 09/10/2017    left forearm-tx p bx, left temple -tx p bx    Has the patient had major surgery during 100 days prior to admission? No   Family History   family history includes CAD in her mother; Stroke in her brother and sister.   Current Medications   Current Facility-Administered Medications:    acetaminophen (TYLENOL) tablet 650 mg, 650 mg, Oral, Q4H PRN **OR** acetaminophen (TYLENOL) 160 MG/5ML solution 650 mg, 650 mg, Per Tube, Q4H PRN **OR** acetaminophen (TYLENOL) suppository 650 mg, 650 mg, Rectal, Q4H PRN, Kerney Elbe, MD   ALPRAZolam Duanne Moron) tablet 0.25 mg, 0.25 mg, Oral, QHS PRN, Kerney Elbe, MD, 0.25 mg at 06/06/21 2229   [START ON 06/08/2021] calcitonin (salmon) (MIACALCIN/FORTICAL) nasal spray 1 spray, 1 spray, Alternating Nares, Daily, Rozann Lesches, RPH   Chlorhexidine Gluconate Cloth 2 % PADS 6 each, 6 each, Topical, Daily, Kerney Elbe, MD, 6 each at 06/07/21 1149   DULoxetine (CYMBALTA) DR capsule 60 mg, 60 mg, Oral, Daily, Kerney Elbe, MD, 60 mg at 06/07/21 1042   lamoTRIgine (LAMICTAL) tablet 25 mg, 25 mg, Oral, BID, Kerney Elbe, MD, 25 mg at 06/07/21 1042   pantoprazole (PROTONIX) EC tablet 40 mg, 40 mg, Oral, QHS, Rosalin Hawking, MD, 40 mg at 06/06/21 2230   pregabalin (LYRICA) capsule 25 mg, 25 mg, Oral, BID, Kerney Elbe, MD, 25 mg at 06/07/21 1042   [START ON 06/08/2021] rosuvastatin (CRESTOR) tablet 5 mg, 5 mg, Oral, Daily, Rozann Lesches, RPH   senna-docusate (Senokot-S) tablet 1 tablet, 1 tablet, Oral, BID, Kerney Elbe, MD, 1 tablet at  06/07/21 1042   sodium chloride flush (NS) 0.9 % injection 3 mL, 3 mL, Intravenous, Once, Elnora Morrison, MD   Patients Current Diet:  Diet Order                  Diet heart healthy/carb modified Room service appropriate? Yes with Assist;  Fluid consistency: Thin  Diet effective now                       Precautions / Restrictions Precautions Precautions: Fall Precaution Comments: R knee problems recently Restrictions Weight Bearing Restrictions: No    Has the patient had 2 or more falls or a fall with injury in the past year? No   Prior Activity Level Community (5-7x/wk): used cane due to knee issues   Prior Functional Level Self Care: Did the patient need help bathing, dressing, using the toilet or eating? Independent   Indoor Mobility: Did the patient need assistance with walking from room to room (with or without device)? Independent   Stairs: Did the patient need assistance with internal or external stairs (with or without device)? Independent   Functional Cognition: Did the patient need help planning regular tasks such as shopping or remembering to take medications? Independent   Patient Information Are you of Hispanic, Latino/a,or Spanish origin?: A. No, not of Hispanic, Latino/a, or Spanish origin What is your race?: A. White Do you need or want an interpreter to communicate with a doctor or health care staff?: 0. No   Patient's Response To:  Health Literacy and Transportation Is the patient able to respond to health literacy and transportation needs?: Yes Health Literacy - How often do you need to have someone help you when you read instructions, pamphlets, or other written material from your doctor or pharmacy?: Never In the past 12 months, has lack of transportation kept you from medical appointments or from getting medications?: No In the past 12 months, has lack of transportation kept you from meetings, work, or from getting things needed for daily living?:  No   Home Assistive Devices / Equipment Home Equipment: Conservation officer, nature (2 wheels), [employer] - single point, BSC/3in1   Prior Device Use: Indicate devices/aids used by the patient prior to current illness, exacerbation or injury?  SPC   Current Functional Level Cognition   Overall Cognitive Status: Difficult to assess Difficult to assess due to: Impaired communication Orientation Level: Oriented to person, Oriented to place, Oriented to time General Comments: follows simple 1 step commands with delay but seems to understand at times. Does better when given choices rather than open ended questions. Disoriented to place and situaiton but able to answer month and year when provided choices    Extremity Assessment (includes Sensation/Coordination)   Upper Extremity Assessment: Overall WFL for tasks assessed (baseline tremor; does not appeaqr to have focal weakness)  Lower Extremity Assessment: Defer to PT evaluation RLE Deficits / Details: hip and ankle test 4/5 bilaterally, R knee weakness from pain 3/5 RLE Sensation: WNL RLE Coordination: decreased gross motor     ADLs   Overall ADL's : Needs assistance/impaired Eating/Feeding: Set up, Supervision/ safety Eating/Feeding Details (indicate cue type and reason): baseline tremor; nursing states she fed herself after set up Grooming: Minimal assistance Upper Body Bathing: Minimal assistance, Sitting Lower Body Bathing: Moderate assistance, Sit to/from stand Upper Body Dressing : Minimal assistance Lower Body Dressing: Moderate assistance, Sit to/from stand Toilet Transfer: Minimal assistance, Ambulation Toileting- Clothing Manipulation and Hygiene: Minimal assistance Functional mobility during ADLs: Minimal assistance     Mobility   Overal bed mobility: Needs Assistance Bed Mobility: Supine to Sit Supine to sit: Min assist Sit to supine: Min assist General bed mobility comments: minA to reposition hips towards EOB     Transfers    Overall transfer level: Needs assistance Equipment used:  1 person hand held assist Transfers: Sit to/from Stand Sit to Stand: Mod assist General transfer comment: therapist placed foot in front of patient's R foot due to patient's foot kept sliding fwd. ModA for boost up into standing     Ambulation / Gait / Stairs / Wheelchair Mobility   Ambulation/Gait Ambulation/Gait assistance: Mod assist Gait Distance (Feet): 10 Feet (+10') Assistive device: 1 person hand held assist Gait Pattern/deviations: Step-to pattern General Gait Details: unsteady with mobility, reaching for things in L hand with R hand in therapist hand. ModA for balance Gait velocity: decreased Gait velocity interpretation: <1.31 ft/sec, indicative of household ambulator     Posture / Balance Balance Overall balance assessment: Needs assistance Sitting-balance support: Feet supported, No upper extremity supported Sitting balance-Leahy Scale: Fair Standing balance support: Single extremity supported, During functional activity Standing balance-Leahy Scale: Poor Standing balance comment: needs HHA to maintain balance     Special needs/care consideration New HTN Diagnosis    Previous Home Environment  Living Arrangements: Spouse/significant other  Lives With: Spouse Available Help at Discharge: Family, Available 24 hours/day Type of Home: House Home Layout: Two level, Able to live on main level with bedroom/bathroom, Full bath on main level Alternate Level Stairs-Rails: Right Alternate Level Stairs-Number of Steps: flight Home Access: Stairs to enter Entrance Stairs-Rails: Right, Left Entrance Stairs-Number of Steps: 3 Bathroom Shower/Tub: Chiropodist: Handicapped height Bathroom Accessibility: No Home Care Services: No Additional Comments: husband has had multiple knee surgeries, walks with [facility] with affected gait   Discharge Living Setting Plans for Discharge Living Setting: Patient's home,  Lives with (comment) (spouse) Type of Home at Discharge: House Discharge Home Layout: Two level, Able to live on main level with bedroom/bathroom Alternate Level Stairs-Rails: Right Alternate Level Stairs-Number of Steps: flight Discharge Home Access: Stairs to enter Entrance Stairs-Rails: Right, Left Entrance Stairs-Number of Steps: 3 Discharge Bathroom Shower/Tub: Tub/shower unit Discharge Bathroom Toilet: Handicapped height Discharge Bathroom Accessibility: Yes How Accessible: Accessible via walker Does the patient have any problems obtaining your medications?: No   Social/Family/Support Systems Patient Roles: Spouse Contact Information: spouse, Tommy Anticipated Caregiver: spouse , son and dsughter Anticipated Ambulance person Information: see contacts Ability/Limitations of Caregiver: spouse uses SPC with affected gait from old CVA Caregiver Availability: 24/7 Discharge Plan Discussed with Primary Caregiver: Yes Is Caregiver In Agreement with Plan?: Yes Does Caregiver/Family have Issues with Lodging/Transportation while Pt is in Rehab?: No   Goals Patient/Family Goal for Rehab: supervision to intermittent min assist with PT and OT, supervision with SLP Expected length of stay: ELOS 14 to 20 days Pt/Family Agrees to Admission and willing to participate: Yes Program Orientation Provided & Reviewed with Pt/Caregiver Including Roles  & Responsibilities: Yes   Decrease burden of Care through IP rehab admission: n/a   Possible need for SNF placement upon discharge: not anticipated   Patient Condition: I have reviewed medical [facility], spoken with CM, and patient and spouse. I met with patient at the bedside for inpatient rehabilitation assessment.  Patient will benefit from ongoing PT, OT, and SLP, can actively participate in 3 hours of therapy a day 5 days of the week, and can make measurable gains during the admission.  Patient will also benefit from the  coordinated team approach during an Inpatient Acute Rehabilitation admission.  The patient will receive intensive therapy as well as Rehabilitation physician, nursing, social worker, and care management interventions.  Due to bladder management, bowel management, safety, skin/wound care, disease management,  medication administration, pain management, and patient education the patient requires 24 hour a day rehabilitation nursing.  The patient is currently min to mod assist overall with mobility and basic ADLs.  Discharge setting and therapy post discharge at home with home health is anticipated.  Patient has agreed to participate in the Acute Inpatient Rehabilitation Program and will admit today.   Preadmission Screen Completed By:  Cleatrice Burke, 06/07/2021 2:17 PM ______________________________________________________________________   Discussed status with Dr. Ranell Patrick on 06/07/2021 at 1418 and received approval for admission today.   Admission Coordinator:  Cleatrice Burke, RN, time 5974 Date 06/07/2021    Assessment/Plan: Diagnosis: ICH Does the need for close, 24 hr/day Medical supervision in concert with the patient's rehab needs make it unreasonable for this patient to be served in a less intensive setting? Yes Co-Morbidities requiring supervision/potential complications: left femur fracture, HLD, facial pain syndrome, tremor Due to bladder management, bowel management, safety, skin/wound care, disease management, medication administration, pain management, and patient education, does the patient require 24 hr/day rehab nursing? Yes Does the patient require coordinated care of a physician, rehab nurse, PT, OT, and SLP to address physical and functional deficits in the context of the above medical diagnosis(es)? Yes Addressing deficits in the following areas: balance, endurance, locomotion, strength, transferring, bowel/bladder control, bathing, dressing, feeding, grooming,  toileting, speech, and psychosocial support Can the patient actively participate in an intensive therapy program of at least 3 hrs of therapy 5 days a week? Yes The potential for patient to make measurable gains while on inpatient rehab is excellent Anticipated functional outcomes upon discharge from inpatient rehab: supervision PT, supervision OT, supervision SLP Estimated rehab length of stay to reach the above functional goals is: 10-14 days Anticipated discharge destination: Home 10. Overall Rehab/Functional Prognosis: excellent     MD Signature: Leeroy Cha, MD         Revision History                          Note Details  Author Shelby Ribas, MD File Time 06/07/2021  2:52 PM  Author Type Physician Status Signed  Last Editor Shelby Ribas, MD Service Physical Medicine and [place] # [id number] Admit Date 06/07/2021

## 2021-06-07 NOTE — Care Management Important Message (Signed)
Important Message  Patient Details  Name: Shelby Hill MRN: [id number] Date of Birth: 1935-10-03   Medicare Important Message Given:  Yes     Adrionna Delcid Montine Circle 06/07/2021, 2:44 PM

## 2021-06-07 NOTE — H&P (Signed)
Physical Medicine and Rehabilitation Admission H&P    CC: Functional deficits due to intraparenchymal hemorrhage   HPI: Shelby Hill is an 86 year old female who was receiving a facial on 06/04/2018 3 in the afternoon when the technician noted confusion and answering questions inappropriately.  EMS was called and the patient was brought to [facility] health ED as code stroke.  Her husband and daughter reported no mental status changes earlier that day.  She was evaluated and found to have confusion and word finding difficulty.  CT of the head was significant for left basal ganglia intraparenchymal hemorrhage with mild surrounding edema but no significant mass-effect or midline shift.  There was intraventricular extension into the left lateral ventricle, third ventricle and fourth ventricle without hydrocephalus.  Neurology was consulted and she was given labetalol to improve her blood pressure which was in the 440N systolic to low 027O diastolic.  The patient was taking aspirin and statin prior to admission presentation.  Dr. Cheral Marker admitted the patient and likely etiology of her hemorrhage was most likely severe hypertension.  Goal blood pressure was 536-644 systolic.  MRA performed without evidence of LVO, hemodynamically significant stenosis and no vascular abnormality underlying left basal ganglia IPH.  MRI was performed to rule out other possible etiologies. Grossly stable appearance of intraparenchymal hemorrhage centered at the anterior left basal ganglia with surrounding vasogenic edema and mild regional mass effect. No visible underlying lesion on this motion degraded noncontrast MRI. The patient requires inpatient medicine and rehabilitation evaluations and services for ongoing dysfunction secondary to intraparenchymal hemorrhage.  She has a history of trigeminal neuralgia on Lyrica and Lamictal, multiple BCC skin lesion excisions, left IM nail, tibial plateau ORIF.  2D echo: LVEF estimated at  60-65%. No atrial level shunt detected by color flow Doppler. LDL = 56 Hgb A1c = 5.5 Bilateral carotid artery duplex: No right ICA stenosis, left ICA stenosis 1-39%.  Tremor has been longstanding.  Review of Systems  Constitutional: Negative.   HENT: Negative.    Eyes: Negative.   Respiratory: Negative.    Cardiovascular: Negative.   Gastrointestinal: Negative.   Skin: Negative.   Neurological:  Positive for tremors, speech change and weakness.  Endo/Heme/Allergies: Negative.   Psychiatric/Behavioral: Negative.    Past Medical History:  Diagnosis Date   BCC (basal cell carcinoma) 02/09/1988   left upper thigh,left lower thigh tx cx3 56fu   BCC (basal cell carcinoma) 10/04/1962   left clavicle,left breast, Back   BCC (basal cell carcinoma) 03/06/1989   mid back   BCC (basal cell carcinoma) 04/02/1990   left upper shoulder (CX35FU),left upper breast(CX35FU),above left clavicle, right outer back,   BCC (basal cell carcinoma) 04/01/1991   right cetner outer brown (CX3+exc. ), upper left chest,upper left chest medial, right back   BCC (basal cell carcinoma) 02/01/1994   right back   BCC (basal cell carcinoma) 03/26/1995   central upper back (CX35FU),left upper back (CX35FU)   BCC (basal cell carcinoma) 12/30/1997   right cheek (exc. )   BCC (basal cell carcinoma) 08/06/1999   upper right shin (CX35FU), upper right shin (CX35FU)   BCC (basal cell carcinoma) 08/24/1998   left breast (CX35FU)   BCC (basal cell carcinoma) 08/05/2001   right inner cheek (MOHS), Left upper back (CX35FU),right upper back (CX35FU),, right top hand (CX35FU)   BCC (basal cell carcinoma) 08/02/2002   upper back(CX35FU)   BCC (basal cell carcinoma) 09/12/2003   right mid back (CX35FU), right thigh (deep freeze+aldara) Left thigh outer (  deep freeze +aldara)   BCC (basal cell carcinoma) 06/03/2005   rigth scapula (CX35FU),    BCC (basal cell carcinoma) 06/05/2006   right ant. 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 (CX35FU)   SCC (squamous cell carcinoma) 06/05/2006   mid chest (CX35FU)   SCC (squamous cell carcinoma) 08/08/2010   left temple (CX35FU), Left upper arm (CX35FU), right forearm/wrist (CX35FU)   SCC (squamous cell carcinoma) 02/06/2012   Left post neck-tx p bx   SCC (squamous cell carcinoma) 11/09/2012   left hand-tx p bx-, left chest sup -tx p bx, left chest inf. (CX35FU)   SCC (squamous cell carcinoma) 08/04/2014   left clavicle-tx p bx   SCC (squamous cell carcinoma) 11/30/2014   lower right leg distal (CX35FU), lower right leg, prox. (CX35FU)   SCC (squamous cell carcinoma) 05/17/2015   right jawline (CX35FU), Left sideburn (CX35FU)   SCC (squamous cell carcinoma) 09/10/2017   left forearm-tx p bx, left temple -tx p bx   Past Surgical History:  Procedure Laterality Date   COLONOSCOPY  01/01/2012   Procedure: COLONOSCOPY;  Surgeon: Rogene Houston, MD;  [facility];  Service: Endoscopy;  Laterality: N/A;  830   INTRAMEDULLARY (IM) NAIL INTERTROCHANTERIC Left 04/26/2017   Procedure: INTRAMEDULLARY (IM) NAIL INTERTROCHANTRIC;  Surgeon: Leandrew Koyanagi, MD;  Location: [facility];  Service: Orthopedics;  Laterality: Left;   KNEE SURGERY     ORIF TIBIA PLATEAU     Family History  Problem Relation Age of Onset   CAD Mother    Stroke Sister    Stroke Brother    Social History:  reports that she has never smoked. She has  never used smokeless tobacco. She reports current alcohol use. She reports that she does not use drugs. Allergies:  Allergies  Allergen Reactions   Codeine Nausea And Vomiting   Medications Prior to Admission  Medication Sig Dispense Refill   acetaminophen (TYLENOL) 500 MG tablet Take 1,000 mg by mouth every 6 (six) hours as needed for mild pain.     ALPRAZolam (XANAX) 0.5 MG tablet Take 0.5 tablets (0.25 mg total) by mouth at bedtime as needed for anxiety or sleep. 3 tablet 0   aspirin EC 81 MG tablet Take 81 mg by mouth at bedtime. Swallow whole.     calcitonin, salmon, (MIACALCIN/FORTICAL) 200 UNIT/ACT nasal spray Place 1 spray into alternate nostrils daily.     DULoxetine (CYMBALTA) 60 MG capsule TAKE 1 CAPSULE BY MOUTH DAILY (Patient taking differently: 60 mg daily.) 30 capsule 5   ibuprofen (ADVIL) 200 MG tablet Take 200 mg by mouth every 6 (six) hours as needed for headache.     lamoTRIgine (LAMICTAL) 25 MG tablet Take 25 mg twice daily (Patient taking differently: Take 25 mg by mouth 2 (two) times daily. Take 25 mg twice daily) 60 tablet 5   pregabalin (LYRICA) 25 MG capsule Take 1  capsule (25 mg total) by mouth 2 (two) times daily. 180 capsule 1   rosuvastatin (CRESTOR) 5 MG tablet Take 5 mg by mouth at bedtime.      Drug Regimen Review  Drug regimen was reviewed and remains appropriate with no significant issues identified  Home: Home Living Family/patient expects to be discharged to:: Private residence Living Arrangements: Spouse/significant other Available Help at Discharge: Family, Available 24 hours/day Type of Home: House Home Access: Stairs to enter [employer] of Steps: 3 Entrance Stairs-Rails: Right, Left Home Layout: Two level, Able to live on main level with bedroom/bathroom, Full bath on main level Alternate Level Stairs-Number of Steps: flight Alternate Level Stairs-Rails: Right Bathroom Shower/Tub: Chiropodist: Handicapped  height Bathroom Accessibility: No Home Equipment: Conservation officer, nature (2 wheels), [employer] - single point, BSC/3in1 Additional Comments: husband has had multiple knee surgeries, walks with SPC with affected gait  Lives With: Spouse   Functional History: Prior Function Prior Level of Function : Independent/Modified Independent Mobility Comments: has been ambulating with SPC due to R knee pain (has some screws that may need to come out per husband)  Functional Status:  Mobility: Bed Mobility Overal bed mobility: Needs Assistance Bed Mobility: Supine to Sit Supine to sit: Min assist Sit to supine: Min assist General bed mobility comments: minA to reposition hips towards EOB Transfers Overall transfer level: Needs assistance Equipment used: 1 person hand held assist Transfers: Sit to/from Stand Sit to Stand: Mod assist General transfer comment: therapist placed foot in front of patient's R foot due to patient's foot kept sliding fwd. ModA for boost up into standing Ambulation/Gait Ambulation/Gait assistance: Mod assist Gait Distance (Feet): 10 Feet (+10') Assistive device: 1 person hand held assist Gait Pattern/deviations: Step-to pattern General Gait Details: unsteady with mobility, reaching for things in L hand with R hand in therapist hand. ModA for balance Gait velocity: decreased Gait velocity interpretation: <1.31 ft/sec, indicative of household ambulator    ADL: ADL Overall ADL's : Needs assistance/impaired Eating/Feeding: Set up, Supervision/ safety Eating/Feeding Details (indicate cue type and reason): baseline tremor; nursing states she fed herself after set up Grooming: Minimal assistance Upper Body Bathing: Minimal assistance, Sitting Lower Body Bathing: Moderate assistance, Sit to/from stand Upper Body Dressing : Minimal assistance Lower Body Dressing: Moderate assistance, Sit to/from stand Toilet Transfer: Minimal assistance, Ambulation Toileting- Clothing Manipulation  and Hygiene: Minimal assistance Functional mobility during ADLs: Minimal assistance  Cognition: Cognition Overall Cognitive Status: Difficult to assess Orientation Level: Oriented to person, Oriented to situation Cognition Arousal/Alertness: Awake/alert Behavior During Therapy: WFL for tasks assessed/performed Overall Cognitive Status: Difficult to assess General Comments: follows simple 1 step commands with delay but seems to understand at times. Does better when given choices rather than open ended questions. Disoriented to place and situaiton but able to answer month and year when provided choices Difficult to assess due to: Impaired communication  Physical Exam: Blood pressure 120/65, pulse 84, temperature 98.2 F (36.8 C), temperature source Oral, resp. rate 16, height 5\' 6"  (1.676 m), weight 78.3 kg, SpO2 99 %. Physical Exam Gen: no distress, normal appearing, husband and daughter at bedside HEENT: oral mucosa pink and moist, NCAT Cardio: Reg rate Chest: normal effort, normal rate of breathing Abd: soft, non-distended Ext: no edema Psych: pleasant, normal affect Skin: intact Neuro/Musculoskeletal: bilateral and head tremor, intentional, bilateral FTN impaired. Dysarthria. Expressive aphasia. Aox1. Sensation intact. 5/5 strength throughout  Results for orders placed or performed during the hospital encounter of 06/04/21 (from the past  48 hour(s))  Lipid panel     Status: None   Collection Time: 06/06/21  3:57 AM  Result Value Ref Range   Cholesterol 127 0 - 200 mg/dL   Triglycerides 45 <150 mg/dL   HDL 62 >40 mg/dL   Total CHOL/HDL Ratio 2.0 RATIO   VLDL 9 0 - 40 mg/dL   LDL Cholesterol 56 0 - 99 mg/dL    Comment:        Total Cholesterol/HDL:CHD Risk Coronary Heart Disease Risk Table                     Men   Women  1/2 Average Risk   3.4   3.3  Average Risk       5.0   4.4  2 X Average Risk   9.6   7.1  3 X Average Risk  23.4   11.0        Use the calculated  Patient Ratio above and the CHD Risk Table to determine the patient's CHD Risk.        ATP III CLASSIFICATION (LDL):  <100     mg/dL   Optimal  100-129  mg/dL   Near or Above                    Optimal  130-159  mg/dL   Borderline  160-189  mg/dL   High  >190     mg/dL   Very High Performed at [facility] [address] [place], [place] [address]   Hemoglobin A1c     Status: None   Collection Time: 06/06/21  3:57 AM  Result Value Ref Range   Hgb A1c MFr Bld 5.5 4.8 - 5.6 %    Comment: (NOTE) Pre diabetes:          5.7%-6.4%  Diabetes:              >6.4%  Glycemic control for   <7.0% adults with diabetes    Mean Plasma Glucose 111.15 mg/dL    Comment: Performed at [facility] [address] [place], [place] [address]   MR ANGIO HEAD WO CONTRAST  Result Date: 06/05/2021 CLINICAL DATA:  Follow-up examination for stroke. EXAM: MRI HEAD WITHOUT CONTRAST MRA HEAD WITHOUT CONTRAST TECHNIQUE: Multiplanar, multi-echo pulse sequences of the brain and surrounding structures were acquired without intravenous contrast. Angiographic images of the Circle of Willis were acquired using MRA technique without intravenous contrast. COMPARISON:  Prior CT from earlier the same day. FINDINGS: MRI HEAD FINDINGS Brain: Examination moderately to severely degraded by motion artifact. Additionally, the patient was unable to tolerate the full length of the exam. Cerebral volume within normal limits for age. Moderate chronic microvascular ischemic disease noted involving the periventricular deep white matter both cerebral hemispheres. Previously identified intraparenchymal hematoma centered at the anterior left basal ganglia of is grossly stable in size as compared to previous. Surrounding vasogenic edema and mild regional mass effect. Left lateral ventricle is partially effaced without significant midline shift. No visible underlying lesion on this motion degraded noncontrast MRI. Intraventricular  extension with blood within the left greater than right lateral ventricles. Stable ventricular size and morphology without progressive hydrocephalus or trapping. No other evidence for acute or subacute infarct. Gray-white matter differentiation otherwise maintained. No other visible areas of chronic cortical infarction. No other acute intracranial hemorrhage. No other significant chronic blood products elsewhere within the brain. No visible mass lesion or extra-axial fluid  collection. Pituitary gland suprasellar region grossly normal. Midline structures intact. Vascular: Major intracranial vascular flow voids are maintained. Skull and upper cervical spine: Craniocervical junction grossly normal. Bone marrow signal intensity within normal limits. No scalp soft tissue abnormality. Sinuses/Orbits: Patient status post bilateral ocular lens replacement. Paranasal sinuses are largely clear. No significant mastoid effusion. Other: None. MRA HEAD FINDINGS Anterior circulation: Examination moderately to severely degraded by motion artifact. Visualized distal cervical segments of the internal carotid arteries are patent with antegrade flow. Petrous, cavernous, and supraclinoid segments patent without significant stenosis or other visible abnormality. A1 segments patent bilaterally. Normal anterior communicating artery complex. Anterior cerebral arteries limited assessment due to motion, but appear to be grossly patent to their distal aspects on 3D time-of-flight sequence. No visible M1 stenosis or occlusion. Grossly negative MCA bifurcations. Distal MCA branches grossly perfused and symmetric. Posterior circulation: Right vertebral artery strongly dominant and widely patent to the vertebrobasilar junction. Left vertebral artery hypoplastic and grossly patent as well. Both PICA grossly patent at their origins. Basilar patent to its distal aspect without stenosis. Superior cerebellar arteries patent bilaterally. Right PCA  supplied via the basilar. Predominant fetal type origin left PCA. Both PCAs grossly patent to their distal aspects without stenosis. Anatomic variants: Fetal type origin of the left PCA. No visible aneurysm. No visible vascular abnormality seen underlying the left basal ganglia hemorrhage. IMPRESSION: MRI HEAD IMPRESSION: 1. Motion degraded exam. 2. Grossly stable appearance of intraparenchymal hemorrhage centered at the anterior left basal ganglia with surrounding vasogenic edema and mild regional mass effect. No visible underlying lesion on this motion degraded noncontrast MRI. 3. Intraventricular extension with blood within the left greater than right lateral ventricles. Stable ventricular size and morphology without progressive hydrocephalus or trapping. 4. No other acute intracranial abnormality. MRA HEAD IMPRESSION: 1. Technically limited exam due to extensive motion artifact. 2. Grossly negative intracranial MRA, with no large vessel occlusion, hemodynamically significant stenosis, or other acute vascular abnormality. No visible vascular abnormality seen underlying the left basal ganglia hemorrhage. No visible aneurysm. Electronically Signed   By: Jeannine Boga M.D.   On: 06/05/2021 21:42   MR BRAIN WO CONTRAST  Result Date: 06/05/2021 CLINICAL DATA:  Follow-up examination for stroke. EXAM: MRI HEAD WITHOUT CONTRAST MRA HEAD WITHOUT CONTRAST TECHNIQUE: Multiplanar, multi-echo pulse sequences of the brain and surrounding structures were acquired without intravenous contrast. Angiographic images of the Circle of Willis were acquired using MRA technique without intravenous contrast. COMPARISON:  Prior CT from earlier the same day. FINDINGS: MRI HEAD FINDINGS Brain: Examination moderately to severely degraded by motion artifact. Additionally, the patient was unable to tolerate the full length of the exam. Cerebral volume within normal limits for age. Moderate chronic microvascular ischemic disease  noted involving the periventricular deep white matter both cerebral hemispheres. Previously identified intraparenchymal hematoma centered at the anterior left basal ganglia of is grossly stable in size as compared to previous. Surrounding vasogenic edema and mild regional mass effect. Left lateral ventricle is partially effaced without significant midline shift. No visible underlying lesion on this motion degraded noncontrast MRI. Intraventricular extension with blood within the left greater than right lateral ventricles. Stable ventricular size and morphology without progressive hydrocephalus or trapping. No other evidence for acute or subacute infarct. Gray-white matter differentiation otherwise maintained. No other visible areas of chronic cortical infarction. No other acute intracranial hemorrhage. No other significant chronic blood products elsewhere within the brain. No visible mass lesion or extra-axial fluid collection. Pituitary gland suprasellar region grossly normal. Midline  structures intact. Vascular: Major intracranial vascular flow voids are maintained. Skull and upper cervical spine: Craniocervical junction grossly normal. Bone marrow signal intensity within normal limits. No scalp soft tissue abnormality. Sinuses/Orbits: Patient status post bilateral ocular lens replacement. Paranasal sinuses are largely clear. No significant mastoid effusion. Other: None. MRA HEAD FINDINGS Anterior circulation: Examination moderately to severely degraded by motion artifact. Visualized distal cervical segments of the internal carotid arteries are patent with antegrade flow. Petrous, cavernous, and supraclinoid segments patent without significant stenosis or other visible abnormality. A1 segments patent bilaterally. Normal anterior communicating artery complex. Anterior cerebral arteries limited assessment due to motion, but appear to be grossly patent to their distal aspects on 3D time-of-flight sequence. No visible  M1 stenosis or occlusion. Grossly negative MCA bifurcations. Distal MCA branches grossly perfused and symmetric. Posterior circulation: Right vertebral artery strongly dominant and widely patent to the vertebrobasilar junction. Left vertebral artery hypoplastic and grossly patent as well. Both PICA grossly patent at their origins. Basilar patent to its distal aspect without stenosis. Superior cerebellar arteries patent bilaterally. Right PCA supplied via the basilar. Predominant fetal type origin left PCA. Both PCAs grossly patent to their distal aspects without stenosis. Anatomic variants: Fetal type origin of the left PCA. No visible aneurysm. No visible vascular abnormality seen underlying the left basal ganglia hemorrhage. IMPRESSION: MRI HEAD IMPRESSION: 1. Motion degraded exam. 2. Grossly stable appearance of intraparenchymal hemorrhage centered at the anterior left basal ganglia with surrounding vasogenic edema and mild regional mass effect. No visible underlying lesion on this motion degraded noncontrast MRI. 3. Intraventricular extension with blood within the left greater than right lateral ventricles. Stable ventricular size and morphology without progressive hydrocephalus or trapping. 4. No other acute intracranial abnormality. MRA HEAD IMPRESSION: 1. Technically limited exam due to extensive motion artifact. 2. Grossly negative intracranial MRA, with no large vessel occlusion, hemodynamically significant stenosis, or other acute vascular abnormality. No visible vascular abnormality seen underlying the left basal ganglia hemorrhage. No visible aneurysm. Electronically Signed   By: Jeannine Boga M.D.   On: 06/05/2021 21:42   ECHOCARDIOGRAM COMPLETE  Result Date: 06/05/2021    ECHOCARDIOGRAM REPORT   Patient Name:   Shelby Hill Date of Exam: 06/05/2021 Medical Rec #:  [id number]    Height:       66.0 in Accession #:    [id number]   Weight:       172.6 lb Date of Birth:  [birth year]   BSA:           1.879 m Patient Age:    26 years     BP:           154/69 mmHg Patient Gender: F            HR:           87 bpm. Exam Location:  Inpatient Procedure: 2D Echo Indications:    Stroke  History:        Patient has no prior history of Echocardiogram examinations.  Sonographer:    Arlyss Gandy Referring Phys: Terry  1. Left ventricular ejection fraction, by estimation, is 60 to 65%. The left ventricle has normal function. The left ventricle has no regional wall motion abnormalities. Left ventricular diastolic parameters are consistent with Grade I diastolic dysfunction (impaired relaxation).  2. Right ventricular systolic function is normal. The right ventricular size is normal. Tricuspid regurgitation signal is inadequate for assessing PA pressure.  3. The mitral valve is normal in structure.  No evidence of mitral valve regurgitation. No evidence of mitral stenosis.  4. The aortic valve is tricuspid. Aortic valve regurgitation is not visualized. Aortic valve sclerosis is present, with no evidence of aortic valve stenosis.  5. The inferior vena cava is normal in size with greater than 50% respiratory variability, suggesting right atrial pressure of 3 mmHg. Conclusion(s)/Recommendation(s): No intracardiac source of embolism detected on this transthoracic study. Consider a transesophageal echocardiogram to exclude cardiac source of embolism if clinically indicated. FINDINGS  Left Ventricle: Left ventricular ejection fraction, by estimation, is 60 to 65%. The left ventricle has normal function. The left ventricle has no regional wall motion abnormalities. The left ventricular internal cavity size was normal in size. There is  no left ventricular hypertrophy. Left ventricular diastolic parameters are consistent with Grade I diastolic dysfunction (impaired relaxation). Indeterminate filling pressures. Right Ventricle: The right ventricular size is normal. No increase in right ventricular wall  thickness. Right ventricular systolic function is normal. Tricuspid regurgitation signal is inadequate for assessing PA pressure. Left Atrium: Left atrial size was normal in size. Right Atrium: Right atrial size was normal in size. Pericardium: There is no evidence of pericardial effusion. Mitral Valve: The mitral valve is normal in structure. No evidence of mitral valve regurgitation. No evidence of mitral valve stenosis. Tricuspid Valve: The tricuspid valve is normal in structure. Tricuspid valve regurgitation is not demonstrated. No evidence of tricuspid stenosis. Aortic Valve: The aortic valve is tricuspid. Aortic valve regurgitation is not visualized. Aortic valve sclerosis is present, with no evidence of aortic valve stenosis. Aortic valve mean gradient measures 5.0 mmHg. Aortic valve peak gradient measures 9.7  mmHg. Aortic valve area, by VTI measures 1.71 cm. Pulmonic Valve: The pulmonic valve was normal in structure. Pulmonic valve regurgitation is not visualized. No evidence of pulmonic stenosis. Aorta: The aortic root is normal in size and structure. Venous: The inferior vena cava is normal in size with greater than 50% respiratory variability, suggesting right atrial pressure of 3 mmHg. IAS/Shunts: No atrial level shunt detected by color flow Doppler.  LEFT VENTRICLE PLAX 2D LVIDd:         4.19 cm   Diastology LVIDs:         3.15 cm   LV e' medial:    5.00 cm/s LV PW:         1.01 cm   LV E/e' medial:  14.5 LV IVS:        0.98 cm   LV e' lateral:   6.64 cm/s LVOT diam:     2.00 cm   LV E/e' lateral: 10.9 LV SV:         53 LV SV Index:   28 LVOT Area:     3.14 cm  RIGHT VENTRICLE RV S prime:     13.20 cm/s TAPSE (M-mode): 1.9 cm LEFT ATRIUM             Index LA diam:        2.60 cm 1.38 cm/m LA Vol (A2C):   38.7 ml 20.60 ml/m LA Vol (A4C):   29.5 ml 15.70 ml/m LA Biplane Vol: 33.9 ml 18.05 ml/m  AORTIC VALVE AV Area (Vmax):    1.65 cm AV Area (Vmean):   1.64 cm AV Area (VTI):     1.71 cm AV Vmax:            156.00 cm/s AV Vmean:          106.000 cm/s AV VTI:  0.310 m AV Peak Grad:      9.7 mmHg AV Mean Grad:      5.0 mmHg LVOT Vmax:         82.00 cm/s LVOT Vmean:        55.300 cm/s LVOT VTI:          0.169 m LVOT/AV VTI ratio: 0.55  AORTA Ao Root diam: 3.00 cm Ao Asc diam:  3.00 cm MITRAL VALVE MV Area (PHT): 4.17 cm    SHUNTS MV Decel Time: 182 msec    Systemic VTI:  0.17 m MV E velocity: 72.40 cm/s  Systemic Diam: 2.00 cm MV A velocity: 86.50 cm/s MV E/A ratio:  0.84 Mihai Croitoru MD Electronically signed by Sanda Klein MD Signature Date/Time: 06/05/2021/11:10:14 AM    Final    VAS US CAROTID  Result Date: 06/06/2021 Carotid Arterial Duplex Study Patient Name:  Shelby Hill  Date of Exam:   06/05/2021 Medical Rec #: [id number]     Accession #:    [id number] Date of Birth: 1935/12/17    Patient Gender: F Patient Age:   37 years Exam Location:  [facility] Procedure:      VAS US CAROTID Referring Phys: Cornelius Moras XU --------------------------------------------------------------------------------  Indications:       CVA. Risk Factors:      Hyperlipidemia. Other Factors:     Intracranial hemorrhage. Comparison Study:  No prior studies. Performing Technologist: Darlin Coco RDMS, RVT  Examination Guidelines: A complete evaluation includes B-mode imaging, spectral Doppler, color Doppler, and power Doppler as needed of all accessible portions of each vessel. Bilateral testing is considered an integral part of a complete examination. Limited examinations for reoccurring indications may be performed as noted.  Right Carotid Findings: +----------+--------+--------+--------+------------------+------------------+             PSV cm/s EDV cm/s Stenosis Plaque Description Comments            +----------+--------+--------+--------+------------------+------------------+  CCA Prox   53       11                                                        +----------+--------+--------+--------+------------------+------------------+  CCA Distal 77       12                                   intimal thickening  +----------+--------+--------+--------+------------------+------------------+  ICA Prox   42       9                                                        +----------+--------+--------+--------+------------------+------------------+  ICA Distal 30       18                                   tortuous            +----------+--------+--------+--------+------------------+------------------+  ECA        87       9                                                        +----------+--------+--------+--------+------------------+------------------+ +----------+--------+-------+----------------+-------------------+  PSV cm/s EDV cms Describe         Arm Pressure (mmHG)  +----------+--------+-------+----------------+-------------------+  Subclavian 99               Multiphasic, WNL                      +----------+--------+-------+----------------+-------------------+ +---------+--------+--+--------+--+---------+  Vertebral PSV cm/s 40 EDV cm/s 13 Antegrade  +---------+--------+--+--------+--+---------+  Left Carotid Findings: +----------+--------+--------+--------+-------------------------+--------+             PSV cm/s EDV cm/s Stenosis Plaque Description        Comments  +----------+--------+--------+--------+-------------------------+--------+  CCA Prox   97       11                                                    +----------+--------+--------+--------+-------------------------+--------+  CCA Distal 66       15                                                    +----------+--------+--------+--------+-------------------------+--------+  ICA Prox   44       10       1-39%    calcific and heterogenous           +----------+--------+--------+--------+-------------------------+--------+  ICA Distal 64       22                                                     +----------+--------+--------+--------+-------------------------+--------+  ECA        92       8                                                     +----------+--------+--------+--------+-------------------------+--------+ +----------+--------+--------+----------------+-------------------+             PSV cm/s EDV cm/s Describe         Arm Pressure (mmHG)  +----------+--------+--------+----------------+-------------------+  Subclavian 137               Multiphasic, WNL                      +----------+--------+--------+----------------+-------------------+ +---------+--------+--+--------+-+---------+  Vertebral PSV cm/s 51 EDV cm/s 7 Antegrade  +---------+--------+--+--------+-+---------+   Summary: Right Carotid: The extracranial vessels were near-normal with only minimal wall                thickening or plaque. Left Carotid: Velocities in the left ICA are consistent with a 1-39% stenosis. Vertebrals:  Bilateral vertebral arteries demonstrate antegrade flow. Subclavians: Normal flow hemodynamics were seen in bilateral subclavian              arteries. *See table(s) above for measurements and observations.  Electronically signed by Antony Contras MD on 06/06/2021 at 8:18:05 AM.    Final        Medical Problem List and Plan: 1.  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  Antithrombotics: -DVT/anticoagulation:   none given IPH  -antiplatelet therapy: none 3. Pain Management: Tylenol PRN 4. Anxiety: LCSW to evaluate and provide emotional support. Continue Xanax .0.25 mg q HS PRN sleep, anxiety  -antipsychotic agents: n/a 5. Neuropsych: This patient is not capable of making decisions on her own behalf. 6. Skin/Wound Care: Routine skin checks 7. Fluids/Electrolytes/Nutrition: Routine Is and Os and follow-up chemistries 8. Hypertension: stable currently, continue to monitor.  No meds. --No home medications PTA 9. Hyperlipidemia: continue Crestor 10: DM-2: carb modified diet; Hgb A1c = 5.5.  --No home medications PTA 11: Trigeminal neuralgia: continue Lamictal, duloxetine and Lyrica.  --Followed by [facility]. --Previously attempted to stop Lyrica, but had increase in nerve pain  I have personally performed a face to face diagnostic evaluation, including, but not limited to relevant history and physical exam findings, of this patient and developed relevant assessment and plan.  Additionally, I have reviewed and concur with the physician assistant's documentation above.  Leeroy Cha, MD  Barbie Banner, PA-C 06/07/2021

## 2021-06-07 NOTE — Discharge Summary (Addendum)
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 What changed:  medication strength how much to take when to take this reasons to take this   ALPRAZolam 0.5 MG tablet Commonly known as: XANAX Take 0.5 tablets (0.25 mg total) by mouth at bedtime as needed for anxiety or sleep.   calcitonin (salmon) 200 UNIT/ACT nasal spray Commonly known as: MIACALCIN/FORTICAL Place 1 spray into alternate nostrils daily.   DULoxetine 60 MG capsule Commonly known as: CYMBALTA TAKE 1 CAPSULE BY MOUTH DAILY What changed: how to take this   ibuprofen 200 MG tablet Commonly known as: ADVIL Take 200 mg by mouth every 6 (six) hours as needed for headache.   lamoTRIgine 25 MG tablet Commonly known as: LaMICtal Take 25 mg twice daily What changed:  how much to take how to take this when to take this   pregabalin 25 MG capsule Commonly known as: Lyrica Take 1 capsule (25 mg total) by mouth 2 (two) times daily.   rosuvastatin 5 MG tablet Commonly known as: CRESTOR Take 5 mg by mouth at bedtime.   senna-docusate 8.6-50 MG tablet Commonly known as: Senokot-S Take 1 tablet by mouth 2 (two) times daily.         LABORATORY STUDIES CBC    Component Value Date/Time   WBC 6.5 06/04/2021 1700   RBC 4.23 06/04/2021 1700   HGB 13.3 06/04/2021 1743   HGB 13.9 12/09/2018 1549   HCT 39.0 06/04/2021 1743   HCT 41.9 12/09/2018 1549   PLT 210 06/04/2021 1700   PLT 194 12/09/2018 1549   MCV 92.9 06/04/2021 1700   MCV 92 12/09/2018 1549   MCH 30.5 06/04/2021 1700   MCHC 32.8 06/04/2021 1700   RDW 13.2 06/04/2021 1700   RDW 12.6 12/09/2018 1549   LYMPHSABS 1.4 06/04/2021 1700   LYMPHSABS 1.4 12/09/2018 1549   MONOABS 0.5 06/04/2021 1700   EOSABS 0.1 06/04/2021 1700   EOSABS 0.1 12/09/2018 1549   BASOSABS 0.0 06/04/2021 1700   BASOSABS 0.0 12/09/2018 1549   CMP    Component Value Date/Time   NA 140 06/04/2021 1743   NA 144 12/09/2018 1549   K 3.5 06/04/2021 1743   CL 103 06/04/2021 1743   CO2 27 06/04/2021 1700   GLUCOSE 96 06/04/2021 1743   BUN 17 06/04/2021 1743   BUN 18 12/09/2018 1549   CREATININE 0.50 06/04/2021 1743   CALCIUM 8.6 (L) 06/04/2021 1700   PROT 6.3 (L) 06/04/2021 1700   PROT 7.2 12/09/2018 1549   ALBUMIN 3.5 06/04/2021 1700   ALBUMIN 4.4 12/09/2018 1549  AST 20 06/04/2021 1700   ALT 9 06/04/2021 1700   ALKPHOS 67 06/04/2021 1700   BILITOT 1.1 06/04/2021 1700   BILITOT 0.6 12/09/2018 1549   GFRNONAA >60 06/04/2021 1700   GFRAA 97 12/09/2018 1549   COAGS Lab Results  Component Value Date   INR 1.0 06/04/2021   Lipid Panel    Component Value Date/Time   CHOL 127 06/06/2021 0357   TRIG 45 06/06/2021 0357   HDL 62 06/06/2021 0357   CHOLHDL 2.0 06/06/2021 0357   VLDL 9 06/06/2021 0357   LDLCALC 56 06/06/2021 0357   HgbA1C  Lab Results  Component Value Date   HGBA1C 5.5 06/06/2021   Urinalysis No results found for: COLORURINE, APPEARANCEUR, LABSPEC, PHURINE, GLUCOSEU, HGBUR, BILIRUBINUR, KETONESUR, PROTEINUR, UROBILINOGEN, NITRITE, LEUKOCYTESUR Urine Drug Screen No results found for: LABOPIA, COCAINSCRNUR, LABBENZ, AMPHETMU, THCU, LABBARB  Alcohol  Level No results found for: ETH   SIGNIFICANT DIAGNOSTIC STUDIES CT HEAD WO CONTRAST (5MM)  Result Date: 06/05/2021 CLINICAL DATA:  Follow-up hemorrhagic stroke. EXAM: CT HEAD WITHOUT CONTRAST TECHNIQUE: Contiguous axial images were obtained from the base of the skull through the vertex without intravenous contrast. COMPARISON:  06/04/2021 FINDINGS: Brain: Hematoma below the left caudate head measuring up to 2.5 cm on axial slices. Intraventricular extension with clot in the left more than right lateral ventricles and in the third ventricle. Small volume clot in the fourth ventricle. No hydrocephalus. Increased edema around the hematoma, expected. No worrisome mass effect. Underlying chronic small vessel ischemia and cerebral volume loss. No evidence of cortical infarction. Vascular: No hyperdense vessel or unexpected calcification. Skull: Normal. Negative for fracture or focal lesion. Sinuses/Orbits: No acute finding. IMPRESSION: Unchanged parenchymal hemorrhage with intraventricular extension. No hydrocephalus. Electronically Signed   By: Jorje Guild M.D.   On: 06/05/2021 05:32   MR ANGIO HEAD WO CONTRAST  Result Date: 06/05/2021 CLINICAL DATA:  Follow-up examination for stroke. EXAM: MRI HEAD WITHOUT CONTRAST MRA HEAD WITHOUT CONTRAST TECHNIQUE: Multiplanar, multi-echo pulse sequences of the brain and surrounding structures were acquired without intravenous contrast. Angiographic images of the Circle of Willis were acquired using MRA technique without intravenous contrast. COMPARISON:  Prior CT from earlier the same day. FINDINGS: MRI HEAD FINDINGS Brain: Examination moderately to severely degraded by motion artifact. Additionally, the patient was unable to tolerate the full length of the exam. Cerebral volume within normal limits for age. Moderate chronic microvascular ischemic disease noted involving the periventricular deep white matter both cerebral hemispheres. Previously identified  intraparenchymal hematoma centered at the anterior left basal ganglia of is grossly stable in size as compared to previous. Surrounding vasogenic edema and mild regional mass effect. Left lateral ventricle is partially effaced without significant midline shift. No visible underlying lesion on this motion degraded noncontrast MRI. Intraventricular extension with blood within the left greater than right lateral ventricles. Stable ventricular size and morphology without progressive hydrocephalus or trapping. No other evidence for acute or subacute infarct. Gray-white matter differentiation otherwise maintained. No other visible areas of chronic cortical infarction. No other acute intracranial hemorrhage. No other significant chronic blood products elsewhere within the brain. No visible mass lesion or extra-axial fluid collection. Pituitary gland suprasellar region grossly normal. Midline structures intact. Vascular: Major intracranial vascular flow voids are maintained. Skull and upper cervical spine: Craniocervical junction grossly normal. Bone marrow signal intensity within normal limits. No scalp soft tissue abnormality. Sinuses/Orbits: Patient status post bilateral ocular lens replacement. Paranasal sinuses are largely clear. No significant mastoid effusion. Other: None. MRA HEAD  FINDINGS Anterior circulation: Examination moderately to severely degraded by motion artifact. Visualized distal cervical segments of the internal carotid arteries are patent with antegrade flow. Petrous, cavernous, and supraclinoid segments patent without significant stenosis or other visible abnormality. A1 segments patent bilaterally. Normal anterior communicating artery complex. Anterior cerebral arteries limited assessment due to motion, but appear to be grossly patent to their distal aspects on 3D time-of-flight sequence. No visible M1 stenosis or occlusion. Grossly negative MCA bifurcations. Distal MCA branches grossly perfused and  symmetric. Posterior circulation: Right vertebral artery strongly dominant and widely patent to the vertebrobasilar junction. Left vertebral artery hypoplastic and grossly patent as well. Both PICA grossly patent at their origins. Basilar patent to its distal aspect without stenosis. Superior cerebellar arteries patent bilaterally. Right PCA supplied via the basilar. Predominant fetal type origin left PCA. Both PCAs grossly patent to their distal aspects without stenosis. Anatomic variants: Fetal type origin of the left PCA. No visible aneurysm. No visible vascular abnormality seen underlying the left basal ganglia hemorrhage. IMPRESSION: MRI HEAD IMPRESSION: 1. Motion degraded exam. 2. Grossly stable appearance of intraparenchymal hemorrhage centered at the anterior left basal ganglia with surrounding vasogenic edema and mild regional mass effect. No visible underlying lesion on this motion degraded noncontrast MRI. 3. Intraventricular extension with blood within the left greater than right lateral ventricles. Stable ventricular size and morphology without progressive hydrocephalus or trapping. 4. No other acute intracranial abnormality. MRA HEAD IMPRESSION: 1. Technically limited exam due to extensive motion artifact. 2. Grossly negative intracranial MRA, with no large vessel occlusion, hemodynamically significant stenosis, or other acute vascular abnormality. No visible vascular abnormality seen underlying the left basal ganglia hemorrhage. No visible aneurysm. Electronically Signed   By: Jeannine Boga M.D.   On: 06/05/2021 21:42   MR BRAIN WO CONTRAST  Result Date: 06/05/2021 CLINICAL DATA:  Follow-up examination for stroke. EXAM: MRI HEAD WITHOUT CONTRAST MRA HEAD WITHOUT CONTRAST TECHNIQUE: Multiplanar, multi-echo pulse sequences of the brain and surrounding structures were acquired without intravenous contrast. Angiographic images of the Circle of Willis were acquired using MRA technique without  intravenous contrast. COMPARISON:  Prior CT from earlier the same day. FINDINGS: MRI HEAD FINDINGS Brain: Examination moderately to severely degraded by motion artifact. Additionally, the patient was unable to tolerate the full length of the exam. Cerebral volume within normal limits for age. Moderate chronic microvascular ischemic disease noted involving the periventricular deep white matter both cerebral hemispheres. Previously identified intraparenchymal hematoma centered at the anterior left basal ganglia of is grossly stable in size as compared to previous. Surrounding vasogenic edema and mild regional mass effect. Left lateral ventricle is partially effaced without significant midline shift. No visible underlying lesion on this motion degraded noncontrast MRI. Intraventricular extension with blood within the left greater than right lateral ventricles. Stable ventricular size and morphology without progressive hydrocephalus or trapping. No other evidence for acute or subacute infarct. Gray-white matter differentiation otherwise maintained. No other visible areas of chronic cortical infarction. No other acute intracranial hemorrhage. No other significant chronic blood products elsewhere within the brain. No visible mass lesion or extra-axial fluid collection. Pituitary gland suprasellar region grossly normal. Midline structures intact. Vascular: Major intracranial vascular flow voids are maintained. Skull and upper cervical spine: Craniocervical junction grossly normal. Bone marrow signal intensity within normal limits. No scalp soft tissue abnormality. Sinuses/Orbits: Patient status post bilateral ocular lens replacement. Paranasal sinuses are largely clear. No significant mastoid effusion. Other: None. MRA HEAD FINDINGS Anterior circulation: Examination moderately to severely degraded  by motion artifact. Visualized distal cervical segments of the internal carotid arteries are patent with antegrade flow.  Petrous, cavernous, and supraclinoid segments patent without significant stenosis or other visible abnormality. A1 segments patent bilaterally. Normal anterior communicating artery complex. Anterior cerebral arteries limited assessment due to motion, but appear to be grossly patent to their distal aspects on 3D time-of-flight sequence. No visible M1 stenosis or occlusion. Grossly negative MCA bifurcations. Distal MCA branches grossly perfused and symmetric. Posterior circulation: Right vertebral artery strongly dominant and widely patent to the vertebrobasilar junction. Left vertebral artery hypoplastic and grossly patent as well. Both PICA grossly patent at their origins. Basilar patent to its distal aspect without stenosis. Superior cerebellar arteries patent bilaterally. Right PCA supplied via the basilar. Predominant fetal type origin left PCA. Both PCAs grossly patent to their distal aspects without stenosis. Anatomic variants: Fetal type origin of the left PCA. No visible aneurysm. No visible vascular abnormality seen underlying the left basal ganglia hemorrhage. IMPRESSION: MRI HEAD IMPRESSION: 1. Motion degraded exam. 2. Grossly stable appearance of intraparenchymal hemorrhage centered at the anterior left basal ganglia with surrounding vasogenic edema and mild regional mass effect. No visible underlying lesion on this motion degraded noncontrast MRI. 3. Intraventricular extension with blood within the left greater than right lateral ventricles. Stable ventricular size and morphology without progressive hydrocephalus or trapping. 4. No other acute intracranial abnormality. MRA HEAD IMPRESSION: 1. Technically limited exam due to extensive motion artifact. 2. Grossly negative intracranial MRA, with no large vessel occlusion, hemodynamically significant stenosis, or other acute vascular abnormality. No visible vascular abnormality seen underlying the left basal ganglia hemorrhage. No visible aneurysm.  Electronically Signed   By: Jeannine Boga M.D.   On: 06/05/2021 21:42   ECHOCARDIOGRAM COMPLETE  Result Date: 06/05/2021    ECHOCARDIOGRAM REPORT   Patient Name:   KENNADY ZIMMERLE Date of Exam: 06/05/2021 Medical Rec #:  [id number]    Height:       66.0 in Accession #:    [id number]   Weight:       172.6 lb Date of Birth:  [birth year]   BSA:          1.879 m Patient Age:    32 years     BP:           154/69 mmHg Patient Gender: F            HR:           87 bpm. Exam Location:  Inpatient Procedure: 2D Echo Indications:    Stroke  History:        Patient has no prior history of Echocardiogram examinations.  Sonographer:    Arlyss Gandy Referring Phys: [place]  1. Left ventricular ejection fraction, by estimation, is 60 to 65%. The left ventricle has normal function. The left ventricle has no regional wall motion abnormalities. Left ventricular diastolic parameters are consistent with Grade I diastolic dysfunction (impaired relaxation).  2. Right ventricular systolic function is normal. The right ventricular size is normal. Tricuspid regurgitation signal is inadequate for assessing PA pressure.  3. The mitral valve is normal in structure. No evidence of mitral valve regurgitation. No evidence of mitral stenosis.  4. The aortic valve is tricuspid. Aortic valve regurgitation is not visualized. Aortic valve sclerosis is present, with no evidence of aortic valve stenosis.  5. The inferior vena cava is normal in size with greater than 50% respiratory variability, suggesting right atrial pressure of 3 mmHg. Conclusion(s)/Recommendation(s):  No intracardiac source of embolism detected on this transthoracic study. Consider a transesophageal echocardiogram to exclude cardiac source of embolism if clinically indicated. FINDINGS  Left Ventricle: Left ventricular ejection fraction, by estimation, is 60 to 65%. The left ventricle has normal function. The left ventricle has no regional wall motion  abnormalities. The left ventricular internal cavity size was normal in size. There is  no left ventricular hypertrophy. Left ventricular diastolic parameters are consistent with Grade I diastolic dysfunction (impaired relaxation). Indeterminate filling pressures. Right Ventricle: The right ventricular size is normal. No increase in right ventricular wall thickness. Right ventricular systolic function is normal. Tricuspid regurgitation signal is inadequate for assessing PA pressure. Left Atrium: Left atrial size was normal in size. Right Atrium: Right atrial size was normal in size. Pericardium: There is no evidence of pericardial effusion. Mitral Valve: The mitral valve is normal in structure. No evidence of mitral valve regurgitation. No evidence of mitral valve stenosis. Tricuspid Valve: The tricuspid valve is normal in structure. Tricuspid valve regurgitation is not demonstrated. No evidence of tricuspid stenosis. Aortic Valve: The aortic valve is tricuspid. Aortic valve regurgitation is not visualized. Aortic valve sclerosis is present, with no evidence of aortic valve stenosis. Aortic valve mean gradient measures 5.0 mmHg. Aortic valve peak gradient measures 9.7  mmHg. Aortic valve area, by VTI measures 1.71 cm. Pulmonic Valve: The pulmonic valve was normal in structure. Pulmonic valve regurgitation is not visualized. No evidence of pulmonic stenosis. Aorta: The aortic root is normal in size and structure. Venous: The inferior vena cava is normal in size with greater than 50% respiratory variability, suggesting right atrial pressure of 3 mmHg. IAS/Shunts: No atrial level shunt detected by color flow Doppler.  LEFT VENTRICLE PLAX 2D LVIDd:         4.19 cm   Diastology LVIDs:         3.15 cm   LV e' medial:    5.00 cm/s LV PW:         1.01 cm   LV E/e' medial:  14.5 LV IVS:        0.98 cm   LV e' lateral:   6.64 cm/s LVOT diam:     2.00 cm   LV E/e' lateral: 10.9 LV SV:         53 LV SV Index:   28 LVOT Area:      3.14 cm  RIGHT VENTRICLE RV S prime:     13.20 cm/s TAPSE (M-mode): 1.9 cm LEFT ATRIUM             Index LA diam:        2.60 cm 1.38 cm/m LA Vol (A2C):   38.7 ml 20.60 ml/m LA Vol (A4C):   29.5 ml 15.70 ml/m LA Biplane Vol: 33.9 ml 18.05 ml/m  AORTIC VALVE AV Area (Vmax):    1.65 cm AV Area (Vmean):   1.64 cm AV Area (VTI):     1.71 cm AV Vmax:           156.00 cm/s AV Vmean:          106.000 cm/s AV VTI:            0.310 m AV Peak Grad:      9.7 mmHg AV Mean Grad:      5.0 mmHg LVOT Vmax:         82.00 cm/s LVOT Vmean:        55.300 cm/s LVOT VTI:  0.169 m LVOT/AV VTI ratio: 0.55  AORTA Ao Root diam: 3.00 cm Ao Asc diam:  3.00 cm MITRAL VALVE MV Area (PHT): 4.17 cm    SHUNTS MV Decel Time: 182 msec    Systemic VTI:  0.17 m MV E velocity: 72.40 cm/s  Systemic Diam: 2.00 cm MV A velocity: 86.50 cm/s MV E/A ratio:  0.84 Mihai Croitoru MD Electronically signed by Sanda Klein MD Signature Date/Time: 06/05/2021/11:10:14 AM    Final    CT HEAD CODE STROKE WO CONTRAST  Result Date: 06/04/2021 CLINICAL DATA:  Code stroke. EXAM: CT HEAD WITHOUT CONTRAST TECHNIQUE: Contiguous axial images were obtained from the base of the skull through the vertex without intravenous contrast. COMPARISON:  No prior head CT, correlation is made with MRI brain 01/13/2021. FINDINGS: Brain: High density material, likely hemorrhage, centered in the left basal ganglia, extending into the left lateral ventricle, third ventricle, and fourth ventricle. No hydrocephalus. The parenchymal portion hemorrhage measures up to 2.8 x 2.0 x 1.4 cm (series 3, image 11 and series 5, image 30). There is mild surrounding hypodensity, likely edema. No significant mass effect or midline shift. No extra-axial collection. Periventricular white matter changes, likely the sequela of chronic small vessel ischemic disease. No acute infarct. Vascular: No hyperdense vessel or unexpected calcification. Skull: Normal. Negative for fracture or focal  lesion. Sinuses/Orbits: No acute finding. Other: The mastoid air cells are well aerated. IMPRESSION: 1. Intraparenchymal hemorrhage, centered in the left basal ganglia, with mild surrounding edema but without significant mass effect or midline shift. 2. Intraventricular extension into the left lateral ventricle, third ventricle, and fourth ventricle. No hydrocephalus. Code stroke imaging results were communicated on 06/04/2021 at 5:12 pm to provider Dr. Cheral Marker via secure text paging. Electronically Signed   By: Merilyn Baba M.D.   On: 06/04/2021 17:15   VAS US CAROTID  Result Date: 06/06/2021 Carotid Arterial Duplex Study Patient Name:  EMERLYN MEHLHOFF  Date of Exam:   06/05/2021 Medical Rec #: [id number]     Accession #:    [id number] Date of Birth: 1936-01-22    Patient Gender: F Patient Age:   86 years Exam Location:  [facility] Procedure:      VAS US CAROTID Referring Phys: Cornelius Moras Giavonna Pflum --------------------------------------------------------------------------------  Indications:       CVA. Risk Factors:      Hyperlipidemia. Other Factors:     Intracranial hemorrhage. Comparison Study:  No prior studies. Performing Technologist: Darlin Coco RDMS, RVT  Examination Guidelines: A complete evaluation includes B-mode imaging, spectral Doppler, color Doppler, and power Doppler as needed of all accessible portions of each vessel. Bilateral testing is considered an integral part of a complete examination. Limited examinations for reoccurring indications may be performed as noted.  Right Carotid Findings: +----------+--------+--------+--------+------------------+------------------+             PSV cm/s EDV cm/s Stenosis Plaque Description Comments            +----------+--------+--------+--------+------------------+------------------+  CCA Prox   53       11                                                       +----------+--------+--------+--------+------------------+------------------+  CCA Distal 77       12  intimal thickening  +----------+--------+--------+--------+------------------+------------------+  ICA Prox   42       9                                                        +----------+--------+--------+--------+------------------+------------------+  ICA Distal 30       18                                   tortuous            +----------+--------+--------+--------+------------------+------------------+  ECA        87       9                                                        +----------+--------+--------+--------+------------------+------------------+ +----------+--------+-------+----------------+-------------------+             PSV cm/s EDV cms Describe         Arm Pressure (mmHG)  +----------+--------+-------+----------------+-------------------+  Subclavian 99               Multiphasic, WNL                      +----------+--------+-------+----------------+-------------------+ +---------+--------+--+--------+--+---------+  Vertebral PSV cm/s 40 EDV cm/s 13 Antegrade  +---------+--------+--+--------+--+---------+  Left Carotid Findings: +----------+--------+--------+--------+-------------------------+--------+             PSV cm/s EDV cm/s Stenosis Plaque Description        Comments  +----------+--------+--------+--------+-------------------------+--------+  CCA Prox   97       11                                                    +----------+--------+--------+--------+-------------------------+--------+  CCA Distal 66       15                                                    +----------+--------+--------+--------+-------------------------+--------+  ICA Prox   44       10       1-39%    calcific and heterogenous           +----------+--------+--------+--------+-------------------------+--------+  ICA Distal 64       22                                                    +----------+--------+--------+--------+-------------------------+--------+  ECA        92       8                                                      +----------+--------+--------+--------+-------------------------+--------+ +----------+--------+--------+----------------+-------------------+  PSV cm/s EDV cm/s Describe         Arm Pressure (mmHG)  +----------+--------+--------+----------------+-------------------+  Subclavian 137               Multiphasic, WNL                      +----------+--------+--------+----------------+-------------------+ +---------+--------+--+--------+-+---------+  Vertebral PSV cm/s 51 EDV cm/s 7 Antegrade  +---------+--------+--+--------+-+---------+   Summary: Right Carotid: The extracranial vessels were near-normal with only minimal wall                thickening or plaque. Left Carotid: Velocities in the left ICA are consistent with a 1-39% stenosis. Vertebrals:  Bilateral vertebral arteries demonstrate antegrade flow. Subclavians: Normal flow hemodynamics were seen in bilateral subclavian              arteries. *See table(s) above for measurements and observations.  Electronically signed by Antony Contras MD on 06/06/2021 at 8:18:05 AM.    Final        HISTORY OF PRESENT ILLNESS Patient with a history of basal cell carcinoma, squamous cell carcinoma, HLD and trigeminal neuralgia presented with acute onset of right sided facial droop, speech difficulties and confusion.   HOSPITAL COURSE Patient was admitted on 1/9 with right sided facial droop, garbled speech and confusion.  She had been getting a facial at the salon and was noted to have become confused.  EMS was called, and patient was taken to the ER.  She was found to have a left basal ganglia IPH with IVH, likely secondary to hypertension.  Repeat CT showed a stable IPH with no additional bleeding, and MRI demonstrated no vascular abnormality underlying the IPH.  Patient was able to be moved out of the ICU on 1/11 and was ready for discharge to CIR on 1/12.  ICH:   Left BG IPH with IVH likely secondary hypertensive  source Code Stroke : Intraparenchymal hemorrhage, centered in the left basal ganglia, with mild surrounding edema but without significant mass effect or midline shift. Intraventricular extension into the left lateral ventricle, third ventricle, and fourth ventricle. No hydrocephalus Repeat CT- unchanged IPH MRI  Stable IPH in anterior left basal ganglia with IVH, no other acute abnormality MRA  no LVO, hemodynamically significant stenosis and no vascular abnormality underlying left basal ganglia IPH LDL 56 HgbA1c 5.5 VTE prophylaxis - SCDs aspirin 81 mg daily prior to admission, now on No antithrombotic. Given IPH Therapy recommendations:  CIR Disposition:  pending   Hypertension Home meds:  None Stable Off Cleviprex Long-term goal normotensive   Hyperlipidemia Home meds:  Crestor 5mg  LDL 56 Continue statin at discharge   Other Stroke Risk Factors Advanced Age >/= 4    Other Active Problems Trigeminal Neuralgia- sees [facility] outpatient  Lamictal, duloxetine, and lyrica Previously tried to stop lyrica but had an increase in nerve pain   DISCHARGE EXAM Blood pressure 135/66, pulse 87, temperature 98.3 F (36.8 C), temperature source Oral, resp. rate 17, height 5\' 6"  (1.676 m), weight 78.3 kg, SpO2 96 %. Constitutional: Appears well-developed and well-nourished.      Neuro: Mental Status: Patient is awake, alert, oriented to person only. Transcortical expressive aphasia, difficulty answering questions related to time and situation. Repetition is intact but patient is unable to name all objects.   Cranial Nerves: II: Visual Fields are full. Pupils are equal, round, and reactive to light.   III,IV, VI: EOMI without ptosis  V: Facial sensation is symmetric to temperature VII:  Slight right facial droop VIII: Hearing is intact to voice XII: Tongue protrudes midline without atrophy or fasciculations.  Motor: 5/5 strength was present in all four extremities.  Sensory: Sensation  is symmetric to light touch  in the arms and legs.  Discharge Diet      Diet   Diet heart healthy/carb modified Room service appropriate? Yes with Assist; Fluid consistency: Thin   liquids  DISCHARGE PLAN Disposition:  Transfer to [place] for ongoing PT, OT and ST No antithrombotic for secondary stroke prevention secondary to [place] Recommend ongoing stroke risk factor control by Primary Care Physician at time of discharge from inpatient rehabilitation. Follow-up PCP Jerene Bears B, MD in 2 weeks following discharge from rehab. Follow-up in [facility] in 4 weeks following discharge from rehab, office to schedule an appointment.   35 minutes were spent preparing discharge.  [place] , MSN, AGACNP-BC [facility] See Amion for schedule and pager information 06/07/2021 2:53 PM   ATTENDING NOTE: I reviewed above note and agree with the assessment and plan.   Patient stable, no acute event overnight.  Neuro unchanged.  BP stable.  PT/OT recommend CIR.  Patient ready for discharge.  Follow-up in clinic in 4 weeks.  For detailed assessment and plan, please refer to above as I have made changes wherever appropriate.   Rosalin Hawking, MD PhD Stroke Neurology 06/07/2021 6:48 PM    Rosalin Hawking, MD PhD Stroke Neurology 06/07/2021 6:48 PM

## 2021-06-07 NOTE — TOC Transition Note (Signed)
Transition of Care [facility]) - CM/SW Discharge Note   Patient Details  Name: Shelby Hill MRN: [id number] Date of Birth: 1936/04/06  [facility] CM/SW Contact:  Pollie Friar, RN Phone Number: 06/07/2021, 3:15 PM   Clinical Narrative:    Patient is discharging to CIR today. CM signing off.    Final next level of care: IP Rehab Facility Barriers to Discharge: No Barriers Identified   Patient Goals and CMS Choice     Choice offered to / list presented to : Patient  Discharge Placement                       Discharge Plan and Services                                     Social Determinants of Health (SDOH) Interventions     Readmission Risk Interventions No flowsheet data found.

## 2021-06-07 NOTE — Progress Notes (Signed)
Inpatient Rehabilitation Admissions Coordinator   I met at bedside with patient, spouse and daughter at bedside. We discussed goals and expectations of CIR admit , they prefer CIR and I await insurance approval for possible CIR admit today. I will follow up today.  Danne Baxter, RN, MSN Rehab Admissions Coordinator [phone] 06/07/2021 11:17 AM

## 2021-06-07 NOTE — Progress Notes (Signed)
Inpatient Rehabilitation Admissions Coordinator   I have insurance approval to admit to CIR today. Acute team and TOC made aware. I will make the arrangements to admit today.   Danne Baxter, RN, MSN Rehab Admissions Coordinator [phone] 06/07/2021 2:15 PM

## 2021-06-07 NOTE — Progress Notes (Signed)
Inpatient Rehabilitation  Patient information reviewed and entered into eRehab system by Mi Balla M. Keyry Iracheta, M.A., CCC/SLP, PPS Coordinator.  Information including medical coding, functional ability and quality indicators will be reviewed and updated through discharge.    

## 2021-06-07 NOTE — Progress Notes (Signed)
STROKE TEAM PROGRESS NOTE    INTERVAL HISTORY Patient is seen in her room with her husband at the bedside.  She is oriented to self only and able to follow simple commands.  She has been hemodynamically stable and her neurological exam remains stable.  Vitals:   06/06/21 1951 06/07/21 0018 06/07/21 0428 06/07/21 0820  BP: 137/62 117/66 (!) 133/59 120/65  Pulse: 84 88 77 84  Resp: 20 20 18 16   Temp: 98.3 F (36.8 C) 98.7 F (37.1 C) 97.8 F (36.6 C) 98.2 F (36.8 C)  TempSrc: Oral Oral Oral Oral  SpO2: 95% 94% 96% 99%  Weight:      Height:       CBC:  Recent Labs  Lab 06/04/21 1700 06/04/21 1743  WBC 6.5  --   NEUTROABS 4.4  --   HGB 12.9 13.3  HCT 39.3 39.0  MCV 92.9  --   PLT 210  --     Basic Metabolic Panel:  Recent Labs  Lab 06/04/21 1700 06/04/21 1743  NA 135 140  K 3.8 3.5  CL 103 103  CO2 27  --   GLUCOSE 95 96  BUN 17 17  CREATININE 0.73 0.50  CALCIUM 8.6*  --     Lipid Panel:  Recent Labs  Lab 06/06/21 0357  CHOL 127  TRIG 45  HDL 62  CHOLHDL 2.0  VLDL 9  LDLCALC 56    HgbA1c:  Recent Labs  Lab 06/06/21 0357  HGBA1C 5.5    Urine Drug Screen: No results for input(s): LABOPIA, COCAINSCRNUR, LABBENZ, AMPHETMU, THCU, LABBARB in the last 168 hours.  Alcohol Level No results for input(s): ETH in the last 168 hours.  IMAGING past 24 hours No results found.  PHYSICAL EXAM  Physical Exam  Constitutional: Appears well-developed and well-nourished.    Neuro: Mental Status: Patient is awake, alert, oriented to person only. Transcortical expressive aphasia, difficulty answering questions related to time and situation. Repetition is intact but patient is unable to name all objects.  Cranial Nerves: II: Visual Fields are full. Pupils are equal, round, and reactive to light.   III,IV, VI: EOMI without ptosis  V: Facial sensation is symmetric to temperature VII: Slight right facial droop VIII: Hearing is intact to voice XII: Tongue  protrudes midline without atrophy or fasciculations.  Motor: 5/5 strength was present in all four extremities.  Sensory: Sensation is symmetric to light touch  in the arms and legs.   ASSESSMENT/PLAN Shelby Hill is a 86 y.o. female with history of basal cell carcinoma, squamous cell carcinoma, trigeminal neuralgia and HLD presenting with acute onset of right facial droop, garbled speech and confusion. LKN was 1:30 PM while getting a facial at a salon. Staff then noticed patient to be acutely confused and answering questions inappropriately. Technician thought that she was talking in her sleep during the facial; after the facial was done, she contacted the patient's husband. EMS was called and on arrival they noted slurred speech and confusion. CT showed IPH centered in the Lt basal ganglia, mild surrounding edema, intraventricular extension into the left later, third, and fourth ventricle. PT recommends CIR, and patient will be discharged to CIR when insurance approval is received.  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 Intraventricular extension into the left lateral ventricle, third ventricle, and  fourth ventricle. No hydrocephalus Repeat CT- unchanged IPH MRI  Stable IPH in anterior left basal ganglia with IVH, no other acute abnormality MRA  no LVO, hemodynamically significant stenosis and no vascular abnormality underlying left basal ganglia IPH LDL 56 HgbA1c 5.5 VTE prophylaxis - SCDs aspirin 81 mg daily prior to admission, now on No antithrombotic. Given IPH Therapy recommendations:  CIR Disposition:  pending  Hypertension Home meds:  None Stable BP systolic 360-677 Cleviprex infusion if needed  Hyperlipidemia Home meds:  Crestor LDL 56 Continue statin at discharge  Diabetes type II   Home meds:  None HgbA1c 5.5  Other Stroke Risk Factors Advanced Age >/= 53   Other Active Problems Trigeminal Neuralgia- sees [facility] outpatient  Lamictal, duloxetine, and lyrica Previously tried to stop lyrica but had an increase in nerve pain   Hospital day # 3  Patient seen and examined by NP/APP with MD. MD to update note as needed.   [place] , MSN, AGACNP-BC [facility] See Amion for schedule and pager information 06/07/2021 11:08 AM    To contact Stroke Continuity provider, please refer to [email or web address] After hours, contact General Neurology

## 2021-06-07 NOTE — Discharge Instructions (Addendum)
Shelby Hill, you were admitted on 1/9 with a right sided facial droop, confusion and speech difficulties.  You were found to have an intracerebral hemorrhage in the left basal ganglia.  You were taken to the ICU for observation, and your blood pressure was controlled.  You are now ready to be discharged to an inpatient rehabilitation center in preparation for returning home.

## 2021-06-07 NOTE — H&P (Signed)
Physical Medicine and Rehabilitation Admission H&P    CC: Functional deficits due to intraparenchymal hemorrhage   HPI: Shelby Hill is an 86 year old female who was receiving a facial on 06/04/2018 3 in the afternoon when the technician noted confusion and answering questions inappropriately.  EMS was called and the patient was brought to [facility] health ED as code stroke.  Her husband and daughter reported no mental status changes earlier that day.  She was evaluated and found to have confusion and word finding difficulty.  CT of the head was significant for left basal ganglia intraparenchymal hemorrhage with mild surrounding edema but no significant mass-effect or midline shift.  There was intraventricular extension into the left lateral ventricle, third ventricle and fourth ventricle without hydrocephalus.  Neurology was consulted and she was given labetalol to improve her blood pressure which was in the 939Q systolic to low 300P diastolic.  The patient was taking aspirin and statin prior to admission presentation.  Dr. Cheral Marker admitted the patient and likely etiology of her hemorrhage was most likely severe hypertension.  Goal blood pressure was 233-007 systolic.  MRA performed without evidence of LVO, hemodynamically significant stenosis and no vascular abnormality underlying left basal ganglia IPH.  MRI was performed to rule out other possible etiologies. Grossly stable appearance of intraparenchymal hemorrhage centered at the anterior left basal ganglia with surrounding vasogenic edema and mild regional mass effect. No visible underlying lesion on this motion degraded noncontrast MRI. The patient requires inpatient medicine and rehabilitation evaluations and services for ongoing dysfunction secondary to intraparenchymal hemorrhage.  She has a history of trigeminal neuralgia on Lyrica and Lamictal, multiple BCC skin lesion excisions, left IM nail, tibial plateau ORIF.  2D echo: LVEF estimated at  60-65%. No atrial level shunt detected by color flow Doppler. LDL = 56 Hgb A1c = 5.5 Bilateral carotid artery duplex: No right ICA stenosis, left ICA stenosis 1-39%.  Tremor has been longstanding.  Review of Systems  Constitutional: Negative.   HENT: Negative.    Eyes: Negative.   Respiratory: Negative.    Cardiovascular: Negative.   Gastrointestinal: Negative.   Skin: Negative.   Neurological:  Positive for tremors, speech change and weakness.  Endo/Heme/Allergies: Negative.   Psychiatric/Behavioral: Negative.    Past Medical History:  Diagnosis Date   BCC (basal cell carcinoma) 02/09/1988   left upper thigh,left lower thigh tx cx3 17fu   BCC (basal cell carcinoma) 10/04/1962   left clavicle,left breast, Back   BCC (basal cell carcinoma) 03/06/1989   mid back   BCC (basal cell carcinoma) 04/02/1990   left upper shoulder (CX35FU),left upper breast(CX35FU),above left clavicle, right outer back,   BCC (basal cell carcinoma) 04/01/1991   right cetner outer brown (CX3+exc. ), upper left chest,upper left chest medial, right back   BCC (basal cell carcinoma) 02/01/1994   right back   BCC (basal cell carcinoma) 03/26/1995   central upper back (CX35FU),left upper back (CX35FU)   BCC (basal cell carcinoma) 12/30/1997   right cheek (exc. )   BCC (basal cell carcinoma) 08/06/1999   upper right shin (CX35FU), upper right shin (CX35FU)   BCC (basal cell carcinoma) 08/24/1998   left breast (CX35FU)   BCC (basal cell carcinoma) 08/05/2001   right inner cheek (MOHS), Left upper back (CX35FU),right upper back (CX35FU),, right top hand (CX35FU)   BCC (basal cell carcinoma) 08/02/2002   upper back(CX35FU)   BCC (basal cell carcinoma) 09/12/2003   right mid back (CX35FU), right thigh (deep freeze+aldara) Left thigh outer (  deep freeze +aldara)   BCC (basal cell carcinoma) 06/03/2005   rigth scapula (CX35FU),    BCC (basal cell carcinoma) 06/05/2006   right ant. 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 (CX35FU)   SCC (squamous cell carcinoma) 06/05/2006   mid chest (CX35FU)   SCC (squamous cell carcinoma) 08/08/2010   left temple (CX35FU), Left upper arm (CX35FU), right forearm/wrist (CX35FU)   SCC (squamous cell carcinoma) 02/06/2012   Left post neck-tx p bx   SCC (squamous cell carcinoma) 11/09/2012   left hand-tx p bx-, left chest sup -tx p bx, left chest inf. (CX35FU)   SCC (squamous cell carcinoma) 08/04/2014   left clavicle-tx p bx   SCC (squamous cell carcinoma) 11/30/2014   lower right leg distal (CX35FU), lower right leg, prox. (CX35FU)   SCC (squamous cell carcinoma) 05/17/2015   right jawline (CX35FU), Left sideburn (CX35FU)   SCC (squamous cell carcinoma) 09/10/2017   left forearm-tx p bx, left temple -tx p bx   Past Surgical History:  Procedure Laterality Date   COLONOSCOPY  01/01/2012   Procedure: COLONOSCOPY;  Surgeon: Rogene Houston, MD;  [facility]: [facility];  Service: Endoscopy;  Laterality: N/A;  830   INTRAMEDULLARY (IM) NAIL INTERTROCHANTERIC Left 04/26/2017   Procedure: INTRAMEDULLARY (IM) NAIL INTERTROCHANTRIC;  Surgeon: Leandrew Koyanagi, MD;  Location: [facility];  Service: Orthopedics;  Laterality: Left;   KNEE SURGERY     ORIF TIBIA PLATEAU     Family History  Problem Relation Age of Onset   CAD Mother    Stroke Sister    Stroke Brother    Social History:  reports that she has never smoked. She has  never used smokeless tobacco. She reports current alcohol use. She reports that she does not use drugs. Allergies:  Allergies  Allergen Reactions   Codeine Nausea And Vomiting   Medications Prior to Admission  Medication Sig Dispense Refill   acetaminophen (TYLENOL) 325 MG tablet Take 2 tablets (650 mg total) by mouth every 4 (four) hours as needed for mild pain (or temp > 37.5 C (99.5 F)). 30 tablet 0   ALPRAZolam (XANAX) 0.5 MG tablet Take 0.5 tablets (0.25 mg total) by mouth at bedtime as needed for anxiety or sleep. 3 tablet 0   calcitonin, salmon, (MIACALCIN/FORTICAL) 200 UNIT/ACT nasal spray Place 1 spray into alternate nostrils daily.     DULoxetine (CYMBALTA) 60 MG capsule TAKE 1 CAPSULE BY MOUTH DAILY (Patient taking differently: 60 mg daily.) 30 capsule 5   ibuprofen (ADVIL) 200 MG tablet Take 200 mg by mouth every 6 (six) hours as needed for headache.     lamoTRIgine (LAMICTAL) 25 MG tablet Take 25 mg twice daily (Patient taking differently: Take 25 mg by mouth 2 (two) times daily. Take 25 mg twice daily) 60 tablet 5   pregabalin (LYRICA) 25 MG capsule Take 1 capsule (25 mg total) by mouth 2 (  two) times daily. 180 capsule 1   rosuvastatin (CRESTOR) 5 MG tablet Take 5 mg by mouth at bedtime.     senna-docusate (SENOKOT-S) 8.6-50 MG tablet Take 1 tablet by mouth 2 (two) times daily. 30 tablet 0    Drug Regimen Review  Drug regimen was reviewed and remains appropriate with no significant issues identified  Home: Home Living Family/patient expects to be discharged to:: Private residence Living Arrangements: Spouse/significant other Available Help at Discharge: Family, Available 24 hours/day Type of Home: House Home Access: Stairs to enter [employer] of Steps: 3 Entrance Stairs-Rails: Right, Left Home Layout: Two level, Able to live on main level with bedroom/bathroom, Full bath on main level Alternate Level Stairs-Number of Steps: flight Alternate Level  Stairs-Rails: Right Bathroom Shower/Tub: Chiropodist: Handicapped height Bathroom Accessibility: No Home Equipment: Conservation officer, nature (2 wheels), [employer] - single point, BSC/3in1 Additional Comments: husband has had multiple knee surgeries, walks with SPC with affected gait  Lives With: Spouse   Functional History: Prior Function Prior Level of Function : Independent/Modified Independent Mobility Comments: has been ambulating with SPC due to R knee pain (has some screws that may need to come out per husband)   Functional Status:  Mobility: Bed Mobility Overal bed mobility: Needs Assistance Bed Mobility: Supine to Sit Supine to sit: Min assist Sit to supine: Min assist General bed mobility comments: minA to reposition hips towards EOB Transfers Overall transfer level: Needs assistance Equipment used: 1 person hand held assist Transfers: Sit to/from Stand Sit to Stand: Mod assist General transfer comment: therapist placed foot in front of patient's R foot due to patient's foot kept sliding fwd. ModA for boost up into standing Ambulation/Gait Ambulation/Gait assistance: Mod assist Gait Distance (Feet): 10 Feet (+10') Assistive device: 1 person hand held assist Gait Pattern/deviations: Step-to pattern General Gait Details: unsteady with mobility, reaching for things in L hand with R hand in therapist hand. ModA for balance Gait velocity: decreased Gait velocity interpretation: <1.31 ft/sec, indicative of household ambulator   ADL: ADL Overall ADL's : Needs assistance/impaired Eating/Feeding: Set up, Supervision/ safety Eating/Feeding Details (indicate cue type and reason): baseline tremor; nursing states she fed herself after set up Grooming: Minimal assistance Upper Body Bathing: Minimal assistance, Sitting Lower Body Bathing: Moderate assistance, Sit to/from stand Upper Body Dressing : Minimal assistance Lower Body Dressing: Moderate assistance, Sit to/from  stand Toilet Transfer: Minimal assistance, Ambulation Toileting- Clothing Manipulation and Hygiene: Minimal assistance Functional mobility during ADLs: Minimal assistance   Cognition: Cognition Overall Cognitive Status: Difficult to assess Orientation Level: Oriented to person, Oriented to situation Cognition Arousal/Alertness: Awake/alert Behavior During Therapy: WFL for tasks assessed/performed Overall Cognitive Status: Difficult to assess General Comments: follows simple 1 step commands with delay but seems to understand at times. Does better when given choices rather than open ended questions. Disoriented to place and situaiton but able to answer month and year when provided choices Difficult to assess due to: Impaired communication  Physical Exam: Physical Exam Gen: no distress, normal appearing, husband and daughter at bedside HEENT: oral mucosa pink and moist, NCAT Cardio: Reg rate Chest: normal effort, normal rate of breathing Abd: soft, non-distended Ext: no edema Psych: pleasant, normal affect Skin: intact Neuro/Musculoskeletal: bilateral and head tremor, intentional, bilateral FTN impaired. Dysarthria. Expressive aphasia. Aox1. Sensation intact. 5/5 strength throughout  Results for orders placed or performed during the hospital encounter of 06/04/21 (from the past 48 hour(s))  Lipid panel     Status: None   Collection  Time: 06/06/21  3:57 AM  Result Value Ref Range   Cholesterol 127 0 - 200 mg/dL   Triglycerides 45 <150 mg/dL   HDL 62 >40 mg/dL   Total CHOL/HDL Ratio 2.0 RATIO   VLDL 9 0 - 40 mg/dL   LDL Cholesterol 56 0 - 99 mg/dL    Comment:        Total Cholesterol/HDL:CHD Risk Coronary Heart Disease Risk Table                     Men   Women  1/2 Average Risk   3.4   3.3  Average Risk       5.0   4.4  2 X Average Risk   9.6   7.1  3 X Average Risk  23.4   11.0        Use the calculated Patient Ratio above and the CHD Risk Table to determine the  patient's CHD Risk.        ATP III CLASSIFICATION (LDL):  <100     mg/dL   Optimal  100-129  mg/dL   Near or Above                    Optimal  130-159  mg/dL   Borderline  160-189  mg/dL   High  >190     mg/dL   Very High Performed at [facility] [address] [place], [place] [address]   Hemoglobin A1c     Status: None   Collection Time: 06/06/21  3:57 AM  Result Value Ref Range   Hgb A1c MFr Bld 5.5 4.8 - 5.6 %    Comment: (NOTE) Pre diabetes:          5.7%-6.4%  Diabetes:              >6.4%  Glycemic control for   <7.0% adults with diabetes    Mean Plasma Glucose 111.15 mg/dL    Comment: Performed at [facility] [address] [place],  [address]   MR ANGIO HEAD WO CONTRAST  Result Date: 06/05/2021 CLINICAL DATA:  Follow-up examination for stroke. EXAM: MRI HEAD WITHOUT CONTRAST MRA HEAD WITHOUT CONTRAST TECHNIQUE: Multiplanar, multi-echo pulse sequences of the brain and surrounding structures were acquired without intravenous contrast. Angiographic images of the Circle of Willis were acquired using MRA technique without intravenous contrast. COMPARISON:  Prior CT from earlier the same day. FINDINGS: MRI HEAD FINDINGS Brain: Examination moderately to severely degraded by motion artifact. Additionally, the patient was unable to tolerate the full length of the exam. Cerebral volume within normal limits for age. Moderate chronic microvascular ischemic disease noted involving the periventricular deep white matter both cerebral hemispheres. Previously identified intraparenchymal hematoma centered at the anterior left basal ganglia of is grossly stable in size as compared to previous. Surrounding vasogenic edema and mild regional mass effect. Left lateral ventricle is partially effaced without significant midline shift. No visible underlying lesion on this motion degraded noncontrast MRI. Intraventricular extension with blood within the left greater than right  lateral ventricles. Stable ventricular size and morphology without progressive hydrocephalus or trapping. No other evidence for acute or subacute infarct. Gray-white matter differentiation otherwise maintained. No other visible areas of chronic cortical infarction. No other acute intracranial hemorrhage. No other significant chronic blood products elsewhere within the brain. No visible mass lesion or extra-axial fluid collection. Pituitary gland suprasellar region grossly normal. Midline structures intact. Vascular: Major intracranial vascular  flow voids are maintained. Skull and upper cervical spine: Craniocervical junction grossly normal. Bone marrow signal intensity within normal limits. No scalp soft tissue abnormality. Sinuses/Orbits: Patient status post bilateral ocular lens replacement. Paranasal sinuses are largely clear. No significant mastoid effusion. Other: None. MRA HEAD FINDINGS Anterior circulation: Examination moderately to severely degraded by motion artifact. Visualized distal cervical segments of the internal carotid arteries are patent with antegrade flow. Petrous, cavernous, and supraclinoid segments patent without significant stenosis or other visible abnormality. A1 segments patent bilaterally. Normal anterior communicating artery complex. Anterior cerebral arteries limited assessment due to motion, but appear to be grossly patent to their distal aspects on 3D time-of-flight sequence. No visible M1 stenosis or occlusion. Grossly negative MCA bifurcations. Distal MCA branches grossly perfused and symmetric. Posterior circulation: Right vertebral artery strongly dominant and widely patent to the vertebrobasilar junction. Left vertebral artery hypoplastic and grossly patent as well. Both PICA grossly patent at their origins. Basilar patent to its distal aspect without stenosis. Superior cerebellar arteries patent bilaterally. Right PCA supplied via the basilar. Predominant fetal type origin left  PCA. Both PCAs grossly patent to their distal aspects without stenosis. Anatomic variants: Fetal type origin of the left PCA. No visible aneurysm. No visible vascular abnormality seen underlying the left basal ganglia hemorrhage. IMPRESSION: MRI HEAD IMPRESSION: 1. Motion degraded exam. 2. Grossly stable appearance of intraparenchymal hemorrhage centered at the anterior left basal ganglia with surrounding vasogenic edema and mild regional mass effect. No visible underlying lesion on this motion degraded noncontrast MRI. 3. Intraventricular extension with blood within the left greater than right lateral ventricles. Stable ventricular size and morphology without progressive hydrocephalus or trapping. 4. No other acute intracranial abnormality. MRA HEAD IMPRESSION: 1. Technically limited exam due to extensive motion artifact. 2. Grossly negative intracranial MRA, with no large vessel occlusion, hemodynamically significant stenosis, or other acute vascular abnormality. No visible vascular abnormality seen underlying the left basal ganglia hemorrhage. No visible aneurysm. Electronically Signed   By: Jeannine Boga M.D.   On: 06/05/2021 21:42   MR BRAIN WO CONTRAST  Result Date: 06/05/2021 CLINICAL DATA:  Follow-up examination for stroke. EXAM: MRI HEAD WITHOUT CONTRAST MRA HEAD WITHOUT CONTRAST TECHNIQUE: Multiplanar, multi-echo pulse sequences of the brain and surrounding structures were acquired without intravenous contrast. Angiographic images of the Circle of Willis were acquired using MRA technique without intravenous contrast. COMPARISON:  Prior CT from earlier the same day. FINDINGS: MRI HEAD FINDINGS Brain: Examination moderately to severely degraded by motion artifact. Additionally, the patient was unable to tolerate the full length of the exam. Cerebral volume within normal limits for age. Moderate chronic microvascular ischemic disease noted involving the periventricular deep white matter both  cerebral hemispheres. Previously identified intraparenchymal hematoma centered at the anterior left basal ganglia of is grossly stable in size as compared to previous. Surrounding vasogenic edema and mild regional mass effect. Left lateral ventricle is partially effaced without significant midline shift. No visible underlying lesion on this motion degraded noncontrast MRI. Intraventricular extension with blood within the left greater than right lateral ventricles. Stable ventricular size and morphology without progressive hydrocephalus or trapping. No other evidence for acute or subacute infarct. Gray-white matter differentiation otherwise maintained. No other visible areas of chronic cortical infarction. No other acute intracranial hemorrhage. No other significant chronic blood products elsewhere within the brain. No visible mass lesion or extra-axial fluid collection. Pituitary gland suprasellar region grossly normal. Midline structures intact. Vascular: Major intracranial vascular flow voids are maintained. Skull and upper cervical  spine: Craniocervical junction grossly normal. Bone marrow signal intensity within normal limits. No scalp soft tissue abnormality. Sinuses/Orbits: Patient status post bilateral ocular lens replacement. Paranasal sinuses are largely clear. No significant mastoid effusion. Other: None. MRA HEAD FINDINGS Anterior circulation: Examination moderately to severely degraded by motion artifact. Visualized distal cervical segments of the internal carotid arteries are patent with antegrade flow. Petrous, cavernous, and supraclinoid segments patent without significant stenosis or other visible abnormality. A1 segments patent bilaterally. Normal anterior communicating artery complex. Anterior cerebral arteries limited assessment due to motion, but appear to be grossly patent to their distal aspects on 3D time-of-flight sequence. No visible M1 stenosis or occlusion. Grossly negative MCA  bifurcations. Distal MCA branches grossly perfused and symmetric. Posterior circulation: Right vertebral artery strongly dominant and widely patent to the vertebrobasilar junction. Left vertebral artery hypoplastic and grossly patent as well. Both PICA grossly patent at their origins. Basilar patent to its distal aspect without stenosis. Superior cerebellar arteries patent bilaterally. Right PCA supplied via the basilar. Predominant fetal type origin left PCA. Both PCAs grossly patent to their distal aspects without stenosis. Anatomic variants: Fetal type origin of the left PCA. No visible aneurysm. No visible vascular abnormality seen underlying the left basal ganglia hemorrhage. IMPRESSION: MRI HEAD IMPRESSION: 1. Motion degraded exam. 2. Grossly stable appearance of intraparenchymal hemorrhage centered at the anterior left basal ganglia with surrounding vasogenic edema and mild regional mass effect. No visible underlying lesion on this motion degraded noncontrast MRI. 3. Intraventricular extension with blood within the left greater than right lateral ventricles. Stable ventricular size and morphology without progressive hydrocephalus or trapping. 4. No other acute intracranial abnormality. MRA HEAD IMPRESSION: 1. Technically limited exam due to extensive motion artifact. 2. Grossly negative intracranial MRA, with no large vessel occlusion, hemodynamically significant stenosis, or other acute vascular abnormality. No visible vascular abnormality seen underlying the left basal ganglia hemorrhage. No visible aneurysm. Electronically Signed   By: Jeannine Boga M.D.   On: 06/05/2021 21:42       Medical Problem List and Plan: 1. 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  Antithrombotics: -DVT/anticoagulation:   none given IPH  -antiplatelet therapy: none 3. Pain  Management: Tylenol PRN 4. Anxiety: LCSW to evaluate and provide emotional support. Continue Xanax .0.25 mg q HS PRN sleep, anxiety  -antipsychotic agents: n/a 5. Neuropsych: This patient is not capable of making decisions on her own behalf. 6. Skin/Wound Care: Routine skin checks 7. Fluids/Electrolytes/Nutrition: Routine Is and Os and follow-up chemistries 8. Hypertension: stable currently, continue to monitor. No meds. --No home medications PTA 9. Hyperlipidemia: continue Crestor 10: DM-2: carb modified diet; Hgb A1c = 5.5.  --No home medications PTA 11: Trigeminal neuralgia: continue Lamictal, duloxetine and Lyrica.  --Followed by [facility]. --Previously attempted to stop Lyrica, but had increase in nerve pain 12. Essential tremor: start 10mg  propanolol at night. Discussed will also help with HTN and anxiety.   I have personally performed a face to face diagnostic evaluation, including, but not limited to relevant history and physical exam findings, of this patient and developed relevant assessment and plan.  Additionally, I have reviewed and concur with the physician assistant's documentation above.  Barbie Banner, PA-C 06/07/2021   [name], MD 06/07/2021

## 2021-06-08 DIAGNOSIS — I610 Nontraumatic intracerebral hemorrhage in hemisphere, subcortical: Secondary | ICD-10-CM | POA: Diagnosis not present

## 2021-06-08 DIAGNOSIS — E440 Moderate protein-calorie malnutrition: Secondary | ICD-10-CM

## 2021-06-08 DIAGNOSIS — I10 Essential (primary) hypertension: Secondary | ICD-10-CM | POA: Diagnosis not present

## 2021-06-08 DIAGNOSIS — G250 Essential tremor: Secondary | ICD-10-CM | POA: Diagnosis not present

## 2021-06-08 LAB — COMPREHENSIVE METABOLIC PANEL
ALT: 10 U/L (ref 0–44)
AST: 15 U/L (ref 15–41)
Albumin: 3.3 g/dL — ABNORMAL LOW (ref 3.5–5.0)
Alkaline Phosphatase: 59 U/L (ref 38–126)
Anion gap: 10 (ref 5–15)
BUN: 13 mg/dL (ref 8–23)
CO2: 25 mmol/L (ref 22–32)
Calcium: 9 mg/dL (ref 8.9–10.3)
Chloride: 100 mmol/L (ref 98–111)
Creatinine, Ser: 0.73 mg/dL (ref 0.44–1.00)
GFR, Estimated: >60 mL/min (ref >60)
Glucose, Bld: 95 mg/dL (ref 70–99)
Potassium: 3.5 mmol/L (ref 3.5–5.1)
Sodium: 135 mmol/L (ref 135–145)
Total Bilirubin: 1.5 mg/dL — ABNORMAL HIGH (ref 0.3–1.2)
Total Protein: 6.1 g/dL — ABNORMAL LOW (ref 6.5–8.1)

## 2021-06-08 LAB — CBC WITH DIFFERENTIAL/PLATELET
Abs Immature Granulocytes: 0.01 10*3/uL (ref 0.00–0.07)
Basophils Absolute: 0.1 10*3/uL (ref 0.0–0.1)
Basophils Relative: 1 %
Eosinophils Absolute: 0.2 10*3/uL (ref 0.0–0.5)
Eosinophils Relative: 3 %
HCT: 37.8 % (ref 36.0–46.0)
Hemoglobin: 12.4 g/dL (ref 12.0–15.0)
Immature Granulocytes: 0 %
Lymphocytes Relative: 24 %
Lymphs Abs: 1.6 10*3/uL (ref 0.7–4.0)
MCH: 29.7 pg (ref 26.0–34.0)
MCHC: 32.8 g/dL (ref 30.0–36.0)
MCV: 90.4 fL (ref 80.0–100.0)
Monocytes Absolute: 0.7 10*3/uL (ref 0.1–1.0)
Monocytes Relative: 10 %
Neutro Abs: 4.1 10*3/uL (ref 1.7–7.7)
Neutrophils Relative %: 62 %
Platelets: 203 10*3/uL (ref 150–400)
RBC: 4.18 MIL/uL (ref 3.87–5.11)
RDW: 13.1 % (ref 11.5–15.5)
WBC: 6.7 10*3/uL (ref 4.0–10.5)
nRBC: 0 % (ref 0.0–0.2)

## 2021-06-08 MED ORDER — ENSURE MAX PROTEIN PO LIQD
11.0000 [oz_av] | Freq: Every day | ORAL | Status: DC
  Administered 2021-06-08 – 2021-06-22 (×13): 11 [oz_av] via ORAL

## 2021-06-08 MED ORDER — PROPRANOLOL HCL 10 MG PO TABS
10.0000 mg | ORAL_TABLET | Freq: Two times a day (BID) | ORAL | Status: DC
  Administered 2021-06-08 – 2021-06-22 (×27): 10 mg via ORAL
  Filled 2021-06-08 (×28): qty 1

## 2021-06-08 NOTE — IPOC Note (Signed)
Overall Plan of Care [facility]) Patient Details Name: CHANTALE LEUGERS MRN: [id number] DOB: 1936/04/14  Admitting Diagnosis: ICH (intracerebral hemorrhage) [facility])  Hospital Problems: Principal Problem:   ICH (intracerebral hemorrhage) ([place])     Functional Problem List: Nursing Bladder, Bowel, Endurance, Medication Management, Perception, Safety, Edema  PT Balance, Endurance, Motor, Pain, Perception, Safety  OT Behavior, Balance, Cognition, Endurance, Motor, Nutrition, Perception, Safety, Sensory, Skin Integrity, Vision  SLP Cognition  TR         Basic ADLs: OT Grooming, Eating, Bathing, Toileting, Dressing     Advanced  ADLs: OT       Transfers: PT Bed Mobility, Bed to Chair, Car, Manufacturing systems engineer, Metallurgist: PT Ambulation, Stairs     Additional Impairments: OT None  SLP Social Cognition, Communication comprehension, expression Attention, Memory, Awareness  TR      Anticipated Outcomes Item Anticipated Outcome  Self Feeding Mod I  Swallowing      Basic self-care  supervision  Toileting  supervision   Bathroom Transfers supervision  Bowel/Bladder  supervision  Transfers  Supervision  Locomotion  Supervision  Communication  Min A  Cognition  Min A  Pain  n/a  Safety/Judgment  supervision and no falls   Therapy Plan: PT Intensity: Minimum of 1-2 x/day ,45 to 90 minutes PT Frequency: 5 out of 7 days PT Duration Estimated Length of Stay: 12-14 days OT Intensity: Minimum of 1-2 x/day, 45 to 90 minutes OT Frequency: 5 out of 7 days OT Duration/Estimated Length of Stay: 2 weeks SLP Intensity: Minumum of 1-2 x/day, 30 to 90 minutes SLP Frequency: 3 to 5 out of 7 days SLP Duration/Estimated Length of Stay: 2 weeks   Due to the current state of emergency, patients may not be receiving their 3-hours of Medicare-mandated therapy.   Team Interventions: Nursing Interventions Patient/Family Education, Bladder Management, Bowel Management,  Disease Management/Prevention, Medication Management, Discharge Planning, Psychosocial Support  PT interventions Ambulation/gait training, Community reintegration, DME/adaptive equipment instruction, Neuromuscular re-education, Psychosocial support, Stair training, UE/LE Strength taining/ROM, Training and development officer, Discharge planning, Functional electrical stimulation, Pain management, Skin care/wound management, Therapeutic Activities, UE/LE Coordination activities, Cognitive remediation/compensation, Disease management/prevention, Functional mobility training, Patient/family education, Splinting/orthotics, Therapeutic Exercise, Visual/perceptual remediation/compensation  OT Interventions Balance/vestibular training, Cognitive remediation/compensation, Community reintegration, Discharge planning, Disease mangement/prevention, DME/adaptive equipment instruction, Functional electrical stimulation, Pain management, Functional mobility training, Neuromuscular re-education, Patient/family education, Psychosocial support, Skin care/wound managment, Wheelchair propulsion/positioning, Visual/perceptual remediation/compensation, UE/LE Coordination activities, UE/LE Strength taining/ROM, Therapeutic Exercise, Therapeutic Activities, Self Care/advanced ADL retraining, Splinting/orthotics  SLP Interventions Cognitive remediation/compensation, Speech/Language facilitation, Cueing hierarchy, Environmental controls, Therapeutic Activities, Functional tasks, Patient/family education, Internal/external aids  TR Interventions    SW/CM Interventions Discharge Planning, Psychosocial Support, Patient/Family Education   Barriers to Discharge MD  Medical stability  Nursing Decreased caregiver support, Home environment access/layout, Incontinence, Weight, Medication compliance Lives in 2 level, 3 steps, 2 rails. Able to live on main level with bedroom/bathroom. Spouse, son, and daughter able to provide 24/7 assist at  discharge.  PT      OT      SLP      SW Decreased caregiver support, Lack of/limited family support Husband will be patient's primary caregiver.   Team Discharge Planning: Destination: PT-Home ,OT- Home , SLP-Home Projected Follow-up: PT-Home health PT, 24 hour supervision/assistance, OT-   , SLP-24 hour supervision/assistance, Outpatient SLP Projected Equipment Needs: PT-To be determined, OT- To be determined, SLP-None recommended by SLP Equipment Details: PT- , OT-  Patient/family  involved in discharge planning: PT- Patient, Family member/caregiver,  OT-Patient, SLP-Patient, Family member/caregiver  MD ELOS: 13-14 days Medical Rehab Prognosis:  Excellent Assessment: The patient has been admitted for CIR therapies with the diagnosis of left pontine infarct. The team will be addressing functional mobility, strength, stamina, balance, safety, adaptive techniques and equipment, self-care, bowel and bladder mgt, patient and caregiver education, NMR, coordination, cognition, language. Goals have been set at supervision to min assist with basic mobility, self-care and cognitive-linguistic needs.   Due to the current state of emergency, patients may not be receiving their 3 hours per day of Medicare-mandated therapy.    Meredith Staggers, MD, FAAPMR     See Team Conference Notes for weekly updates to the plan of care

## 2021-06-08 NOTE — Evaluation (Signed)
Physical Therapy Assessment and Plan  Patient Details  Name: Shelby Hill MRN: [id number] Date of Birth: Nov 07, 1935  PT Diagnosis: Abnormality of gait, Difficulty walking, and Muscle weakness Rehab Potential: Good ELOS: 12-14 days   Today's Date: 06/08/2021 PT Individual Time: 1315-1425 PT Individual Time Calculation (min): 70 min    Hospital Problem: Principal Problem:   ICH (intracerebral hemorrhage) ([place])   Past Medical History:  Past Medical History:  Diagnosis Date   BCC (basal cell carcinoma) 02/09/1988   left upper thigh,left lower thigh tx cx3 64f   BCC (basal cell carcinoma) 10/04/1962   left clavicle,left breast, Back   BCC (basal cell carcinoma) 03/06/1989   mid back   BCC (basal cell carcinoma) 04/02/1990   left upper shoulder (CX35FU),left upper breast(CX35FU),above left clavicle, right outer back,   BCC (basal cell carcinoma) 04/01/1991   right cetner outer brown (CX3+exc. ), upper left chest,upper left chest medial, right back   BCC (basal cell carcinoma) 02/01/1994   right back   BCC (basal cell carcinoma) 03/26/1995   central upper back (CX35FU),left upper back (CX35FU)   BCC (basal cell carcinoma) 12/30/1997   right cheek (exc. )   BCC (basal cell carcinoma) 08/06/1999   upper right shin (CX35FU), upper right shin (CX35FU)   BCC (basal cell carcinoma) 08/24/1998   left breast (CX35FU)   BCC (basal cell carcinoma) 08/05/2001   right inner cheek (MOHS), Left upper back (CX35FU),right upper back (CX35FU),, right top hand (CX35FU)   BCC (basal cell carcinoma) 08/02/2002   upper back(CX35FU)   BCC (basal cell carcinoma) 09/12/2003   right mid back (CX35FU), right thigh (deep freeze+aldara) Left thigh outer (deep freeze +aldara)   BCC (basal cell carcinoma) 06/03/2005   rigth scapula (CX35FU),    BCC (basal cell carcinoma) 06/05/2006   right ant. 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 (CX35FU)   SCC (squamous cell carcinoma) 06/05/2006   mid chest (CX35FU)   SCC (squamous cell carcinoma) 08/08/2010   left temple (CX35FU), Left upper arm (CX35FU), right forearm/wrist (CX35FU)   SCC (squamous cell carcinoma) 02/06/2012   Left post neck-tx p bx   SCC (squamous cell carcinoma) 11/09/2012   left hand-tx p bx-, left chest sup -tx p bx, left chest inf. (CX35FU)   SCC (squamous cell carcinoma) 08/04/2014   left clavicle-tx p bx   SCC (squamous cell carcinoma) 11/30/2014   lower right leg distal (CX35FU), lower right leg, prox. (CX35FU)   SCC (squamous cell carcinoma) 05/17/2015   right jawline (CX35FU), Left sideburn (CX35FU)   SCC (squamous cell carcinoma) 09/10/2017   left forearm-tx p bx, left temple -tx p bx   Past Surgical History:  Past Surgical History:  Procedure Laterality Date   COLONOSCOPY  01/01/2012   Procedure: COLONOSCOPY;  Surgeon: NRogene Houston MD;  [facility];  Service: Endoscopy;  Laterality: N/A;  830   INTRAMEDULLARY (IM)  NAIL INTERTROCHANTERIC Left 04/26/2017   Procedure: INTRAMEDULLARY (IM) NAIL INTERTROCHANTRIC;  Surgeon: Leandrew Koyanagi, MD;  Location: [place];  Service: Orthopedics;  Laterality: Left;   KNEE SURGERY     ORIF TIBIA PLATEAU      Assessment & Plan Clinical Impression: Patient is a  86 year old female who was receiving a facial on 06/04/2018 3 in the afternoon when the technician noted confusion and answering questions inappropriately.  EMS was  called and the patient was brought to [facility] health ED as code stroke.  Her husband and daughter reported no mental status changes earlier that day.  She was evaluated and found to have confusion and word finding difficulty.  CT of the head was significant for left basal ganglia intraparenchymal hemorrhage with mild surrounding edema but no significant mass-effect or midline shift.  There was intraventricular extension into the left lateral ventricle, third ventricle and fourth ventricle without hydrocephalus.  Neurology was consulted and she was given labetalol to improve her blood pressure which was in the 161W systolic to low 960A diastolic.  The patient was taking aspirin and statin prior to admission presentation.  Dr. Cheral Marker admitted the patient and likely etiology of her hemorrhage was most likely severe hypertension.  Goal blood pressure was [id number] systolic.  MRA performed without evidence of LVO, hemodynamically significant stenosis and no vascular abnormality underlying left basal ganglia IPH.  MRI was performed to rule out other possible etiologies. Grossly stable appearance of intraparenchymal hemorrhage centered at the anterior left basal ganglia with surrounding vasogenic edema and mild regional mass effect. No visible underlying lesion on this motion degraded noncontrast MRI. The patient requires inpatient medicine and rehabilitation evaluations and services for ongoing dysfunction secondary to intraparenchymal hemorrhage.   She has a history of trigeminal neuralgia on Lyrica and Lamictal, multiple BCC skin lesion excisions, left IM nail, tibial plateau ORIF.  Patient transferred to CIR on 06/07/2021 .   Patient currently requires mod with mobility secondary to muscle weakness, decreased cardiorespiratoy endurance, decreased coordination and decreased motor planning, ideational apraxia, decreased attention, decreased awareness, decreased problem solving, decreased safety awareness, decreased  memory, and delayed processing, and decreased sitting balance, decreased standing balance, decreased postural control, and decreased balance strategies.  Prior to hospitalization, patient was independent  with mobility and lived with Spouse in a House home.  Home access is 3Stairs to enter.  Patient will benefit from skilled PT intervention to maximize safe functional mobility, minimize fall risk, and decrease caregiver burden for planned discharge home with 24 hour supervision.  Anticipate patient will benefit from follow up [place] at discharge.  PT - End of Session Activity Tolerance: Tolerates 30+ min activity with multiple rests Endurance Deficit: Yes PT Assessment Rehab Potential (ACUTE/IP ONLY): Good PT Patient demonstrates impairments in the following area(s): Balance;Endurance;Motor;Pain;Perception;Safety PT Transfers Functional Problem(s): Bed Mobility;Bed to Chair;Car;Furniture PT Locomotion Functional Problem(s): Ambulation;Stairs PT Plan PT Intensity: Minimum of 1-2 x/day ,45 to 90 minutes PT Frequency: 5 out of 7 days PT Duration Estimated Length of Stay: 12-14 days PT Treatment/Interventions: Ambulation/gait training;Community reintegration;DME/adaptive equipment instruction;Neuromuscular re-education;Psychosocial support;Stair training;UE/LE Strength taining/ROM;Balance/vestibular training;Discharge planning;Functional electrical stimulation;Pain management;Skin care/wound management;Therapeutic Activities;UE/LE Coordination activities;Cognitive remediation/compensation;Disease management/prevention;Functional mobility training;Patient/family education;Splinting/orthotics;Therapeutic Exercise;Visual/perceptual remediation/compensation PT Transfers Anticipated Outcome(s): Supervision PT Locomotion Anticipated Outcome(s): Supervision PT Recommendation Follow Up Recommendations: Home health PT;24 hour supervision/assistance Patient destination: Home Equipment Recommended: To be  determined   PT Evaluation Precautions/Restrictions Precautions Precautions: Fall (Simultaneous filing. User may not have seen previous data.) Precaution Comments: R knee problems recently  Restrictions Weight Bearing Restrictions: No (Simultaneous filing. User may not have seen previous data.) General Chart Reviewed: Yes Family/Caregiver Present: Yes  Pain Interference Pain Interference Pain Effect on Sleep: 8. Unable to answer (Due to apashia) Pain Interference with Therapy Activities: 8. Unable to answer Pain Interference with Day-to-Day Activities: 8. Unable to answer Home Living/Prior Functioning Home Living Living Arrangements: Alone Available Help at Discharge: Family;Available 24 hours/day Type of Home: House Home Access: Stairs to enter [employer] of Steps: 3 Entrance Stairs-Rails: Right;Left Home Layout: Two level;Able to live on main level with bedroom/bathroom;Full bath on main level Alternate Level Stairs-Number of Steps: flight Alternate Level Stairs-Rails: Right Bathroom Shower/Tub: Chiropodist: Handicapped height Bathroom Accessibility: No Additional Comments: husband has had multiple knee surgeries, walks with SPC with affected gait  Lives With: Spouse Prior Function Level of Independence: Independent with transfers;Independent with homemaking with ambulation;Independent with gait  Able to Take Stairs?: Yes Driving: Yes Vocation: Retired Radiographer, therapeutic - History Ability to See in Adequate Light: 0 Adequate Vision - Assessment Additional Comments: Need to assess further, seems more attention deficits Perception Comments: Appears intact Praxis Praxis: Impaired Praxis Impairment Details: Initiation;Motor planning Praxis-Other Comments: Unclear if praxis is impaired or if aphasia is primary impairment  Cognition Overall Cognitive Status: Difficult to assess Arousal/Alertness: Awake/alert Orientation Level:  Oriented to place Year:  (2002, 2003) Month: January Day of Week: Correct Memory: Impaired Memory Impairment: Decreased recall of new information;Decreased short term memory Decreased Short Term Memory: Verbal basic;Functional basic Immediate Memory Recall: Sock;Blue;Bed Memory Recall Sock: Not able to recall Memory Recall Blue: Not able to recall Memory Recall Bed: Not able to recall Awareness: Impaired Awareness Impairment: Emergent impairment Problem Solving: Impaired Problem Solving Impairment: Functional basic Safety/Judgment: Impaired Sensation Sensation Light Touch: Appears Intact Additional Comments: difficult to tell, pt would not respond consistently to get accurate assessment of sensation Coordination Gross Motor Movements are Fluid and Coordinated: No Fine Motor Movements are Fluid and Coordinated: No Coordination and Movement Description: Notable moderate amplitude tremors Finger Nose Finger Test: mild dysmetria Motor  Motor Motor - Skilled Clinical Observations: Noted tremors BUEs>BLEs  Trunk/Postural Assessment  Cervical Assessment Cervical Assessment:  (forward head) Thoracic Assessment Thoracic Assessment:  (rounded shoulders) Lumbar Assessment Lumbar Assessment:  (Posterior pelvic tilt) Postural Control Postural Control: Deficits on evaluation (delayed and inadequate)  Balance Balance Balance Assessed: Yes Static Sitting Balance Static Sitting - Balance Support: Feet supported Static Sitting - Level of Assistance: 5: Stand by assistance Dynamic Sitting Balance Dynamic Sitting - Balance Support: Feet supported Dynamic Sitting - Level of Assistance: 4: Min assist Static Standing Balance Static Standing - Balance Support: During functional activity;Bilateral upper extremity supported Static Standing - Level of Assistance: 4: Min assist Dynamic Standing Balance Dynamic Standing - Balance Support: During functional activity Dynamic Standing - Level of  Assistance: 4: Min assist Extremity Assessment  RLE Assessment General Strength Comments: Grossly 4+/5 LLE Assessment General Strength Comments: Grossly 3+/5  Care Tool Care Tool Bed Mobility Roll left and right activity   Roll left and right assist level: Minimal Assistance - Patient > 75%    Sit to lying activity   Sit to lying assist level: Minimal Assistance - Patient > 75%    Lying to sitting on side of bed activity   Lying to sitting on side of bed assist level: the ability to move from lying on the back to sitting on the side of the bed with no back support.: Minimal Assistance -  Patient > 75%     Care Tool Transfers Sit to stand transfer   Sit to stand assist level: Minimal Assistance - Patient > 75%    Chair/bed transfer   Chair/bed transfer assist level: Minimal Assistance - Patient > 75%     Toilet transfer   Assist Level: Minimal Assistance - Patient > 75%    Car transfer   Car transfer assist level: Moderate Assistance - Patient 50 - 74%      Care Tool Locomotion Ambulation   Assist level: Moderate Assistance - Patient 50 - 74% Assistive device: Hand held assist Max distance: 25'  Walk 10 feet activity   Assist level: Moderate Assistance - Patient - 50 - 74% Assistive device: Hand held assist   Walk 50 feet with 2 turns activity Walk 50 feet with 2 turns activity did not occur: Safety/medical concerns      Walk 150 feet activity Walk 150 feet activity did not occur: Safety/medical concerns      Walk 10 feet on uneven surfaces activity   Assist level: Moderate Assistance - Patient - 50 - 74% Assistive device:  (R hand rail)  Stairs   Assist level: Minimal Assistance - Patient > 75% Stairs assistive device: 2 hand rails Max number of stairs: 4  Walk up/down 1 step activity   Walk up/down 1 step (curb) assist level: Minimal Assistance - Patient > 75% Walk up/down 1 step or curb assistive device: 2 hand rails  Walk up/down 4 steps activity   Walk  up/down 4 steps assist level: Minimal Assistance - Patient > 75% Walk up/down 4 steps assistive device: 2 hand rails  Walk up/down 12 steps activity Walk up/down 12 steps activity did not occur: Safety/medical concerns      Pick up small objects from floor Pick up small object from the floor (from standing position) activity did not occur: Safety/medical concerns      Wheelchair Is the patient using a wheelchair?: No          Wheel 50 feet with 2 turns activity      Wheel 150 feet activity        Refer to Care Plan for Long Term Goals  SHORT TERM GOAL WEEK 1 PT Short Term Goal 1 (Week 1): Pt will perform bed mobility with CGA. PT Short Term Goal 2 (Week 1): Pt will perform sit to stand consistently with CGA. PT Short Term Goal 3 (Week 1): Pt will perform bed to chair consistently with CGA. PT Short Term Goal 4 (Week 1): Pt will ambulate x150' with CGA and LRAD.  Recommendations for other services: None   Skilled Therapeutic Intervention  Evaluation completed (see details above and below) with education on PT POC and goals and individual treatment initiated with focus on bed mobility, balance, transfers, ambulation, and stair training. Pt received seated in WC and agrees to therapy. No complaint of pain but pt is very difficult to understand due to primarily expressive aphasia. WC transport to gym for time management. Pt performs sit to stand with mina and cues for anterior weight shifting and hand placement. Pt ambulates x25' with HHA modA and cues for weight shifting, upright gaze to improve posture and balance, and increasing stride length to decrease risk for falls. Pt completes car transfer and ramp navigation with modA and cues for sequencing. Following seated rest break, pt complete x4 6" steps with minA, requesting seated rest break following completion. Pt ambulates x175' with RW and minA, with cues  for upright gaze to improve posture and balance, increasing proximity to RW for  safety, and safe RW management when returning to [facility]. Pt completes Nustep activity for strength and endurance training, as well as for reciprocal coordination. Pt completes x10:00 at workload of 4 with average steps per minute ~40. Following pt performs sit<>supine on mat table with mina and cues for positioning and sequencing. Pt left seated in WC with alarm intact and all needs within reach. PT explains rehab process to husband and answers questions regarding stay at CIR.   Mobility Bed Mobility Bed Mobility: Supine to Sit;Sit to Supine Supine to Sit: Minimal Assistance - Patient > 75% Sit to Supine: Minimal Assistance - Patient > 75% Transfers Transfers: Sit to Stand;Stand to Sit;Stand Pivot Transfers Sit to Stand: Minimal Assistance - Patient > 75% Stand to Sit: Minimal Assistance - Patient > 75% Stand Pivot Transfers: Minimal Assistance - Patient > 75% Stand Pivot Transfer Details: Tactile cues for posture;Tactile cues for sequencing;Verbal cues for precautions/safety;Verbal cues for technique Transfer (Assistive device): 1 person hand held assist Locomotion  Gait Ambulation: Yes Gait Assistance: Minimal Assistance - Patient > 75% Gait Distance (Feet): 175 Feet Assistive device: Rolling walker Gait Assistance Details: Verbal cues for technique;Verbal cues for safe use of DME/AE;Verbal cues for precautions/safety;Tactile cues for placement;Tactile cues for posture Gait Gait: Yes Gait Pattern: Decreased stride length (LLE externally rotated) Gait velocity: decr Stairs / Additional Locomotion Stairs: Yes Stairs Assistance: Minimal Assistance - Patient > 75% Stair Management Technique: Two rails Number of Stairs: 4 Height of Stairs: 6 Ramp: Moderate Assistance - Patient 50 - 74% Curb: Minimal Assistance - Patient >75% Wheelchair Mobility Wheelchair Mobility: No   Discharge Criteria: Patient will be discharged from PT if patient refuses treatment 3 consecutive times without  medical reason, if treatment goals not met, if there is a change in medical status, if patient makes no progress towards goals or if patient is discharged from hospital.  The above assessment, treatment plan, treatment alternatives and goals were discussed and mutually agreed upon: by patient and by family  Breck Coons, PT, DPT 06/08/2021, 3:43 PM

## 2021-06-08 NOTE — Plan of Care (Signed)
°  Problem: RH Balance Goal: LTG: Patient will maintain dynamic sitting balance (OT) Description: LTG:  Patient will maintain dynamic sitting balance with assistance during activities of daily living (OT) Flowsheets (Taken 06/08/2021 1327) LTG: Pt will maintain dynamic sitting balance during ADLs with: Independent Goal: LTG Patient will maintain dynamic standing with ADLs (OT) Description: LTG:  Patient will maintain dynamic standing balance with assist during activities of daily living (OT)  Flowsheets (Taken 06/08/2021 1327) LTG: Pt will maintain dynamic standing balance during ADLs with: Supervision/Verbal cueing   Problem: Sit to Stand Goal: LTG:  Patient will perform sit to stand in prep for activites of daily living with assistance level (OT) Description: LTG:  Patient will perform sit to stand in prep for activites of daily living with assistance level (OT) Flowsheets (Taken 06/08/2021 1327) LTG: PT will perform sit to stand in prep for activites of daily living with assistance level: Supervision/Verbal cueing   Problem: RH Eating Goal: LTG Patient will perform eating w/assist, cues/equip (OT) Description: LTG: Patient will perform eating with assist, with/without cues using equipment (OT) Flowsheets (Taken 06/08/2021 1327) LTG: Pt will perform eating with assistance level of: Independent   Problem: RH Grooming Goal: LTG Patient will perform grooming w/assist,cues/equip (OT) Description: LTG: Patient will perform grooming with assist, with/without cues using equipment (OT) Flowsheets (Taken 06/08/2021 1327) LTG: Pt will perform grooming with assistance level of: Independent   Problem: RH Bathing Goal: LTG Patient will bathe all body parts with assist levels (OT) Description: LTG: Patient will bathe all body parts with assist levels (OT) Flowsheets (Taken 06/08/2021 1327) LTG: Pt will perform bathing with assistance level/cueing: Supervision/Verbal cueing   Problem: RH  Dressing Goal: LTG Patient will perform upper body dressing (OT) Description: LTG Patient will perform upper body dressing with assist, with/without cues (OT). Flowsheets (Taken 06/08/2021 1327) LTG: Pt will perform upper body dressing with assistance level of: Set up assist Goal: LTG Patient will perform lower body dressing w/assist (OT) Description: LTG: Patient will perform lower body dressing with assist, with/without cues in positioning using equipment (OT) Flowsheets (Taken 06/08/2021 1327) LTG: Pt will perform lower body dressing with assistance level of: Supervision/Verbal cueing   Problem: RH Toileting Goal: LTG Patient will perform toileting task (3/3 steps) with assistance level (OT) Description: LTG: Patient will perform toileting task (3/3 steps) with assistance level (OT)  Flowsheets (Taken 06/08/2021 1327) LTG: Pt will perform toileting task (3/3 steps) with assistance level: Supervision/Verbal cueing   Problem: RH Toilet Transfers Goal: LTG Patient will perform toilet transfers w/assist (OT) Description: LTG: Patient will perform toilet transfers with assist, with/without cues using equipment (OT) Flowsheets (Taken 06/08/2021 1327) LTG: Pt will perform toilet transfers with assistance level of: Supervision/Verbal cueing   Problem: RH Tub/Shower Transfers Goal: LTG Patient will perform tub/shower transfers w/assist (OT) Description: LTG: Patient will perform tub/shower transfers with assist, with/without cues using equipment (OT) Flowsheets (Taken 06/08/2021 1327) LTG: Pt will perform tub/shower stall transfers with assistance level of: Supervision/Verbal cueing   Problem: RH Awareness Goal: LTG: Patient will demonstrate awareness during functional activites type of (OT) Description: LTG: Patient will demonstrate awareness during functional activites type of (OT) Flowsheets (Taken 06/08/2021 1327) Patient will demonstrate awareness during functional activites type of:  Emergent LTG: Patient will demonstrate awareness during functional activites type of (OT): Supervision

## 2021-06-08 NOTE — Progress Notes (Signed)
Inpatient Rehabilitation Admission Medication Review by a Pharmacist  A complete drug regimen review was completed for this patient to identify any potential clinically significant medication issues.  High Risk Drug Classes Is patient taking? Indication by Medication  Antipsychotic Yes Compazine prn for nausea  Anticoagulant No   Antibiotic No   Opioid No   Antiplatelet No   Hypoglycemics/insulin No   Vasoactive Medication Yes Propranolol for tremor  Chemotherapy No   Other No Lyrica, Cymbalta  and Lamictal for trigeminal neuralgia.     Type of Medication Issue Identified Description of Issue Recommendation(s)  Drug Interaction(s) (clinically significant)     Duplicate Therapy     Allergy     No Medication Administration End Date     Incorrect Dose     Additional Drug Therapy Needed     Significant med changes from prior encounter (inform family/care partners about these prior to discharge).  D/c PtA ASA  Other       Clinically significant medication issues were identified that warrant physician communication and completion of prescribed/recommended actions by midnight of the next day:  No  Time spent performing this drug regimen review (minutes):  10 min  Ahja Martello S. Alford Highland, PharmD, BCPS Clinical Staff Pharmacist [email or web address] [facility] [place] 06/08/2021 8:03 AM

## 2021-06-08 NOTE — Progress Notes (Signed)
PROGRESS NOTE   Subjective/Complaints: Pt up in bathroom with OT. No complaints. Perhaps a little restless last night.   ROS: Limited due to cognitive/behavioral    Objective:   No results found. Recent Labs    06/08/21 0519  WBC 6.7  HGB 12.4  HCT 37.8  PLT 203   Recent Labs    06/08/21 0519  NA 135  K 3.5  CL 100  CO2 25  GLUCOSE 95  BUN 13  CREATININE 0.73  CALCIUM 9.0    Intake/Output Summary (Last 24 hours) at 06/08/2021 1045 Last data filed at 06/08/2021 0700 Gross per 24 hour  Intake 240 ml  Output --  Net 240 ml        Physical Exam: Vital Signs Blood pressure (!) 118/53, pulse 77, temperature 98.2 F (36.8 C), temperature source Oral, resp. rate 17, height 5\' 6"  (1.676 m), weight 78.3 kg, SpO2 91 %.   General: Alert and oriented x 3, No apparent distress HEENT: Head is normocephalic, atraumatic, PERRLA, EOMI, sclera anicteric, oral mucosa pink and moist, dentition intact, ext ear canals clear,  Neck: Supple without JVD or lymphadenopathy Heart: Reg rate and rhythm. No murmurs rubs or gallops Chest: CTA bilaterally without wheezes, rales, or rhonchi; no distress Abdomen: Soft, non-tender, non-distended, bowel sounds positive. Extremities: No clubbing, cyanosis, or edema. Pulses are 2+ Psych: Pt's affect is appropriate. Pt is cooperative Skin: Clean and intact without signs of breakdown Neuro:  alert, oriented to person, place, month. Head and bilateral UE intentional tremor. Speech halting. Word finding deficits. Moves all 4 limbs 4 to 4+/5. Decreased FMC, FTN generally d/t tremor. Sensed pain in all 4's. No resting tone, DTR's 1+ Musculoskeletal: no pain with limb, trunk rom   Assessment/Plan: 1. Functional deficits which require 3+ hours per day of interdisciplinary therapy in a comprehensive inpatient rehab setting. Physiatrist is providing close team supervision and 24 hour management of  active medical problems listed below. Physiatrist and rehab team continue to assess barriers to discharge/monitor patient progress toward functional and medical goals  Care Tool:  Bathing              Bathing assist       Upper Body Dressing/Undressing Upper body dressing   What is the patient wearing?: Hospital gown only    Upper body assist Assist Level: Moderate Assistance - Patient 50 - 74%    Lower Body Dressing/Undressing Lower body dressing      What is the patient wearing?: Hospital gown only     Lower body assist Assist for lower body dressing: Moderate Assistance - Patient 50 - 74%     Toileting Toileting    Toileting assist Assist for toileting:  (bedpan)     Transfers Chair/bed transfer  Transfers assist           Locomotion Ambulation   Ambulation assist              Walk 10 feet activity   Assist           Walk 50 feet activity   Assist           Walk 150 feet activity  Assist           Walk 10 feet on uneven surface  activity   Assist           Wheelchair     Assist               Wheelchair 50 feet with 2 turns activity    Assist            Wheelchair 150 feet activity     Assist          Blood pressure (!) 118/53, pulse 77, temperature 98.2 F (36.8 C), temperature source Oral, resp. rate 17, height 5\' 6"  (1.676 m), weight 78.3 kg, SpO2 91 %.  Medical Problem List and Plan: 1. 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  Antithrombotics: -DVT/anticoagulation:   none given IPH             -antiplatelet therapy: none 3. Pain Management: Tylenol PRN 4. Anxiety: LCSW to evaluate and provide emotional support. Continue Xanax .0.25 mg q HS PRN sleep,  anxiety             -antipsychotic agents: n/a 5. Neuropsych: This patient is not capable of making decisions on her own behalf. 6. Skin/Wound Care: Routine skin checks 7. Fluids/Electrolytes/Nutrition: po intake has been variable  -encouraged pt to improve intake  -add protein supplement 8. Hypertension:   --No home medications PTA -bp controlled 9. Hyperlipidemia: continue Crestor 10: DM-2: carb modified diet; Hgb A1c = 5.5.  --No home medications PTA -blood glucose normal on 1/13 bmet 11: Trigeminal neuralgia: continue Lamictal, duloxetine and Lyrica.  --Followed by [facility]. --Previously tried to stop Lyrica, but had increase in nerve pain 12. Essential tremor: started 10mg  propanolol at night.    -will need more than once per day to treat.   1/13 increase to bid to start    -monitor for tolerance  LOS: 1 days A FACE TO FACE EVALUATION WAS PERFORMED  Meredith Staggers 06/08/2021, 10:45 AM

## 2021-06-08 NOTE — Evaluation (Signed)
Occupational Therapy Assessment and Plan  Patient Details  Name: AMISHA POSPISIL MRN: [id number] Date of Birth: 1936/01/24  OT Diagnosis: apraxia, cognitive deficits, and muscle weakness (generalized) Rehab Potential: Rehab Potential (ACUTE ONLY): Excellent ELOS: 2 weeks   Today's Date: 06/08/2021 OT Individual Time: 8850-2774 OT Individual Time Calculation (min): 70 min     Hospital Problem: Principal Problem:   ICH (intracerebral hemorrhage) ([place])   Past Medical History:  Past Medical History:  Diagnosis Date   BCC (basal cell carcinoma) 02/09/1988   left upper thigh,left lower thigh tx cx3 53f   BCC (basal cell carcinoma) 10/04/1962   left clavicle,left breast, Back   BCC (basal cell carcinoma) 03/06/1989   mid back   BCC (basal cell carcinoma) 04/02/1990   left upper shoulder (CX35FU),left upper breast(CX35FU),above left clavicle, right outer back,   BCC (basal cell carcinoma) 04/01/1991   right cetner outer brown (CX3+exc. ), upper left chest,upper left chest medial, right back   BCC (basal cell carcinoma) 02/01/1994   right back   BCC (basal cell carcinoma) 03/26/1995   central upper back (CX35FU),left upper back (CX35FU)   BCC (basal cell carcinoma) 12/30/1997   right cheek (exc. )   BCC (basal cell carcinoma) 08/06/1999   upper right shin (CX35FU), upper right shin (CX35FU)   BCC (basal cell carcinoma) 08/24/1998   left breast (CX35FU)   BCC (basal cell carcinoma) 08/05/2001   right inner cheek (MOHS), Left upper back (CX35FU),right upper back (CX35FU),, right top hand (CX35FU)   BCC (basal cell carcinoma) 08/02/2002   upper back(CX35FU)   BCC (basal cell carcinoma) 09/12/2003   right mid back (CX35FU), right thigh (deep freeze+aldara) Left thigh outer (deep freeze +aldara)   BCC (basal cell carcinoma) 06/03/2005   rigth scapula (CX35FU),    BCC (basal cell carcinoma) 06/05/2006   right ant. 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 (CX35FU)   SCC (squamous cell carcinoma) 06/05/2006   mid chest (CX35FU)   SCC (squamous cell carcinoma) 08/08/2010   left temple (CX35FU), Left upper arm (CX35FU), right forearm/wrist (CX35FU)   SCC (squamous cell carcinoma) 02/06/2012   Left post neck-tx p bx   SCC (squamous cell carcinoma) 11/09/2012   left hand-tx p bx-, left chest sup -tx p bx, left chest inf. (CX35FU)   SCC (squamous cell carcinoma) 08/04/2014   left clavicle-tx p bx   SCC (squamous cell carcinoma) 11/30/2014   lower right leg distal (CX35FU), lower right leg, prox. (CX35FU)   SCC (squamous cell carcinoma) 05/17/2015   right jawline (CX35FU), Left sideburn (CX35FU)   SCC (squamous cell carcinoma) 09/10/2017   left forearm-tx p bx, left temple -tx p bx   Past Surgical History:  Past Surgical History:  Procedure Laterality Date   COLONOSCOPY  01/01/2012   Procedure: COLONOSCOPY;  Surgeon: NRogene Houston MD;  [facility];  Service: Endoscopy;  Laterality: N/A;  830  INTRAMEDULLARY (IM) NAIL INTERTROCHANTERIC Left 04/26/2017   Procedure: INTRAMEDULLARY (IM) NAIL INTERTROCHANTRIC;  Surgeon: Leandrew Koyanagi, MD;  Location: [facility];  Service: Orthopedics;  Laterality: Left;   KNEE SURGERY     ORIF TIBIA PLATEAU      Assessment & Plan Clinical Impression: Patient is a 86 y.o. year old female who was receiving a facial on 06/04/2018 3 in the afternoon when the technician noted confusion and answering  questions inappropriately.  EMS was called and the patient was brought to [facility] health ED as code stroke.  Her husband and daughter reported no mental status changes earlier that day.  She was evaluated and found to have confusion and word finding difficulty.  CT of the head was significant for left basal ganglia intraparenchymal hemorrhage with mild surrounding edema but no significant mass-effect or midline shift.  There was intraventricular extension into the left lateral ventricle, third ventricle and fourth ventricle without hydrocephalus.  Neurology was consulted and she was given labetalol to improve her blood pressure which was in the 222L systolic to low 798X diastolic.  The patient was taking aspirin and statin prior to admission presentation.  Dr. Cheral Marker admitted the patient and likely etiology of her hemorrhage was most likely severe hypertension.  Goal blood pressure was [id number] systolic.  MRA performed without evidence of LVO, hemodynamically significant stenosis and no vascular abnormality underlying left basal ganglia IPH.  MRI was performed to rule out other possible etiologies. Grossly stable appearance of intraparenchymal hemorrhage centered at the anterior left basal ganglia with surrounding vasogenic edema and mild regional mass effect. No visible underlying lesion on this motion degraded noncontrast MRI. The patient requires inpatient medicine and rehabilitation evaluations and services for ongoing dysfunction secondary to intraparenchymal hemorrhage.  Patient transferred to CIR on 06/07/2021 .    Patient currently requires mod with basic self-care skills secondary to muscle weakness, impaired timing and sequencing, unbalanced muscle activation, motor apraxia, ataxia, decreased coordination, and decreased motor planning, decreased initiation, decreased attention, decreased awareness, decreased problem solving, decreased safety awareness, decreased memory, and delayed processing, and decreased  sitting balance, decreased standing balance, decreased postural control, and decreased balance strategies.  Prior to hospitalization, patient could complete BADL with independent .  Patient will benefit from skilled intervention to increase independence with basic self-care skills prior to discharge home with care partner.  Anticipate patient will require 24 hour supervision and follow up home health.  OT - End of Session Endurance Deficit: Yes Endurance Deficit Description: Rest breaks within BADL tasks OT Assessment Rehab Potential (ACUTE ONLY): Excellent OT Patient demonstrates impairments in the following area(s): Behavior;Balance;Cognition;Endurance;Motor;Nutrition;Perception;Safety;Sensory;Skin Integrity;Vision OT Basic ADL's Functional Problem(s): Grooming;Eating;Bathing;Toileting;Dressing OT Transfers Functional Problem(s): Toilet;Tub/Shower OT Additional Impairment(s): None OT Plan OT Intensity: Minimum of 1-2 x/day, 45 to 90 minutes OT Frequency: 5 out of 7 days OT Duration/Estimated Length of Stay: 2 weeks OT Treatment/Interventions: Balance/vestibular training;Cognitive remediation/compensation;Community reintegration;Discharge planning;Disease mangement/prevention;DME/adaptive equipment instruction;Functional electrical stimulation;Pain management;Functional mobility training;Neuromuscular re-education;Patient/family education;Psychosocial support;Skin care/wound managment;Wheelchair propulsion/positioning;Visual/perceptual remediation/compensation;UE/LE Coordination activities;UE/LE Strength taining/ROM;Therapeutic Exercise;Therapeutic Activities;Self Care/advanced ADL retraining;Splinting/orthotics OT Self Feeding Anticipated Outcome(s): Mod I OT Basic Self-Care Anticipated Outcome(s): supervision OT Toileting Anticipated Outcome(s): supervision OT Bathroom Transfers Anticipated Outcome(s): supervision OT Recommendation Recommendations for Other Services: Therapeutic Recreation  consult Therapeutic Recreation Interventions: Outing/community reintergration Patient destination: Home Equipment Recommended: To be determined   OT Evaluation Precautions/Restrictions  Precautions Precautions: Fall (Simultaneous filing. User may not have seen previous data.) Precaution Comments: R knee problems recently Restrictions Weight Bearing Restrictions: No (Simultaneous filing.  User may not have seen previous data.) Pain  Denies pain Home Living/Prior [place] expects to be discharged to:: Private residence Living Arrangements: Alone Available Help at Discharge: Family, Available 24 hours/day Type of Home: House Home Access: Stairs to enter [employer] of Steps: 3 Entrance Stairs-Rails: Right, Left Home Layout: Two level, Able to live on main level with bedroom/bathroom, Full bath on main level Alternate Level Stairs-Number of Steps: flight Alternate Level Stairs-Rails: Right Bathroom Shower/Tub: Chiropodist: Handicapped height Bathroom Accessibility: No Additional Comments: husband has had multiple knee surgeries, walks with SPC with affected gait  Lives With: Spouse IADL History Current License: Yes Education: college IADL Comments: Active and independent, was a Art therapist, enjoys pilates Prior Function Level of Independence: Independent with transfers, Independent with homemaking with ambulation, Independent with gait  Able to Take Stairs?: Yes Driving: Yes Vocation: Retired Surveyor, mining Baseline Vision/History: 0 No visual deficits Ability to See in Adequate Light: 0 Adequate Patient Visual Report: No change from baseline (per pt report) Additional Comments: Need to assess further, seems more attention deficits Perception  Comments: Appears intact Praxis Praxis: Impaired Praxis Impairment Details: Initiation;Motor planning Praxis-Other Comments: Unclear if praxis is impaired or if aphasia is  primary impairment Cognition Overall Cognitive Status: Difficult to assess Arousal/Alertness: Awake/alert Orientation Level: Person Year:  (2002, 2003) Month: January Day of Week: Correct Memory: Impaired Memory Impairment: Decreased recall of new information;Decreased short term memory Decreased Short Term Memory: Verbal basic;Functional basic Immediate Memory Recall: Sock;Blue;Bed Memory Recall Sock: Not able to recall Memory Recall Blue: Not able to recall Memory Recall Bed: Not able to recall Awareness: Impaired Awareness Impairment: Emergent impairment Problem Solving: Impaired Problem Solving Impairment: Functional basic Safety/Judgment: Impaired Sensation Sensation Light Touch: Appears Intact Additional Comments: difficult to tell, pt would not respond consistently to get accurate assessment of sensation Coordination Gross Motor Movements are Fluid and Coordinated: No Fine Motor Movements are Fluid and Coordinated: No Coordination and Movement Description: Notable moderate amplitude tremors Finger Nose Finger Test: mild dysmetria Motor  Motor Motor - Skilled Clinical Observations: Noted tremors BUEs>BLEs  Trunk/Postural Assessment  Cervical Assessment Cervical Assessment:  (forward head) Thoracic Assessment Thoracic Assessment:  (rounded shoulders) Lumbar Assessment Lumbar Assessment:  (Posterior pelvic tilt) Postural Control Postural Control: Deficits on evaluation (delayed and inadequate)  Balance Balance Balance Assessed: Yes Static Sitting Balance Static Sitting - Balance Support: Feet supported Static Sitting - Level of Assistance: 5: Stand by assistance Dynamic Sitting Balance Dynamic Sitting - Balance Support: Feet supported Dynamic Sitting - Level of Assistance: 4: Min assist Static Standing Balance Static Standing - Balance Support: During functional activity;Bilateral upper extremity supported Static Standing - Level of Assistance: 4: Min  assist Dynamic Standing Balance Dynamic Standing - Balance Support: During functional activity Dynamic Standing - Level of Assistance: 4: Min assist Extremity/Trunk Assessment RUE Assessment General Strength Comments: strength WFL, tremors, decreased smoothness and accuracy LUE Assessment General Strength Comments: strength WFL, tremors, decreased smoothness and accuracy  Care Tool Care Tool Self Care Eating   Eating Assist Level: Set up assist    Oral Care    Oral Care Assist Level: Supervision/Verbal cueing    Bathing   Body parts bathed by patient: Right arm;Left arm;Abdomen;Front perineal area;Buttocks;Right upper leg;Left upper leg;Face Body parts bathed by helper: Left lower leg;Right lower leg   Assist Level: Minimal Assistance - Patient > 75%    Upper Body Dressing(including orthotics)   What is the patient wearing?: Pull over shirt  Assist Level: Minimal Assistance - Patient > 75%    Lower Body Dressing (excluding footwear)   What is the patient wearing?: Pants Assist for lower body dressing: Maximal Assistance - Patient 25 - 49%    Putting on/Taking off footwear   What is the patient wearing?: Non-skid slipper socks Assist for footwear: Maximal Assistance - Patient 25 - 49%       Care Tool Toileting Toileting activity   Assist for toileting: Moderate Assistance - Patient 50 - 74%     Care Tool Bed Mobility Roll left and right activity   Roll left and right assist level: Minimal Assistance - Patient > 75%    Sit to lying activity   Sit to lying assist level: Minimal Assistance - Patient > 75%    Lying to sitting on side of bed activity   Lying to sitting on side of bed assist level: the ability to move from lying on the back to sitting on the side of the bed with no back support.: Minimal Assistance - Patient > 75%     Care Tool Transfers Sit to stand transfer   Sit to stand assist level: Minimal Assistance - Patient > 75%    Chair/bed transfer    Chair/bed transfer assist level: Minimal Assistance - Patient > 75%     Toilet transfer   Assist Level: Minimal Assistance - Patient > 75%     Care Tool Cognition  Expression of Ideas and Wants Expression of Ideas and Wants: 2. Frequent difficulty - frequently exhibits difficulty with expressing needs and ideas  Understanding Verbal and Non-Verbal Content Understanding Verbal and Non-Verbal Content: 3. Usually understands - understands most conversations, but misses some part/intent of message. Requires cues at times to understand   Memory/Recall Ability Memory/Recall Ability : Current season   Refer to Care Plan for Long Term Goals  SHORT TERM GOAL WEEK 1 OT Short Term Goal 1 (Week 1): Patient will complete LB dressing with min A OT Short Term Goal 2 (Week 1): Patient will maintain standing balance at the sink with CGA in preparation for BADL task OT Short Term Goal 3 (Week 1): Patient will orient shirt with min questioning cues.  Recommendations for other services: Therapeutic Recreation  Outing/community reintegration   Skilled Therapeutic Intervention OT eval completed addressing rehab process, OT purpose, POC, ELOS, and goals.  Pt with intermittent confusion throughout session with difficulty word finding, mixing up words, and difficulty expressing herself at times. Pt demonstrated motor planning deficits within transitional movements requiring moderate cues. Overall min/mod A for sit<>stand and stand-pivot transfers. Mod A for short ambulation from toilet to shower seat with cues for step length as pt taking very short, shuffled steps. Verbal cues while bathing for initiation and thoroughness  to wash all body parts. Dressing completed sit<>stand at the sink with cues to orient clothing and initiate. Mod/Max A for LB ADLs and min/mod A for UB. Pt with intermittent tremor noted on BU's as well. Pt left seated in wc at end of session with alarm belt on, call bell in reach, and needs met.  See navigator below for further details regarding BADL performance.   ADL ADL Eating: Set up Grooming: Setup;Supervision/safety Upper Body Bathing: Minimal assistance Lower Body Bathing: Moderate assistance Upper Body Dressing: Minimal assistance Lower Body Dressing: Maximal assistance Toileting: Minimal assistance Toilet Transfer: Moderate verbal cueing;Moderate assistance Social research officer, government: Moderate assistance;Moderate cueing Social research officer, government Method: Stand pivot Mobility  Bed Mobility Bed Mobility: Supine to Sit;Sit  to Supine Supine to Sit: Minimal Assistance - Patient > 75% Sit to Supine: Minimal Assistance - Patient > 75% Transfers Sit to Stand: Minimal Assistance - Patient > 75% Stand to Sit: Minimal Assistance - Patient > 75%   Discharge Criteria: Patient will be discharged from OT if patient refuses treatment 3 consecutive times without medical reason, if treatment goals not met, if there is a change in medical status, if patient makes no progress towards goals or if patient is discharged from hospital.  The above assessment, treatment plan, treatment alternatives and goals were discussed and mutually agreed upon: by patient  Valma Cava 06/08/2021, 3:40 PM

## 2021-06-08 NOTE — Discharge Summary (Signed)
Physician Discharge Summary  Patient ID: Shelby Hill MRN: [id number] DOB/AGE: [id number] y.o.  Admit date: 06/07/2021 Discharge date: 06/22/2021   Discharge Diagnoses:  Principal Problem:   ICH (intracerebral hemorrhage) ([place]) Functional deficits due to intra cerebral hemorrhage Active problems: Hypertension Hyperlipidemia Diabetes mellitus type 2 Trigeminal neuralgia Essential tremor Sundowning  Discharged Condition: good  Significant Diagnostic Studies: CT HEAD WO CONTRAST (5MM)  Result Date: 06/19/2021 CLINICAL DATA:  Altered mental status EXAM: CT HEAD WITHOUT CONTRAST TECHNIQUE: Contiguous axial images were obtained from the base of the skull through the vertex without intravenous contrast. RADIATION DOSE REDUCTION: This exam was performed according to the departmental dose-optimization program which includes automated exposure control, adjustment of the mA and/or kV according to patient size and/or use of iterative reconstruction technique. COMPARISON:  Brain MRI 06/05/2021, CT head 06/05/2021 FINDINGS: Brain: Has been interval expected evolution of the intraparenchymal hematoma centered in the left caudate head with overall decreased size and density and decreased surrounding edema. Previously seen intraventricular blood has resolved. There is an re-expansion of the left frontal horn. There is no new intracranial hemorrhage or extra-axial fluid collection. There is no evidence of acute infarct. Background global parenchymal volume loss and chronic white matter microangiopathy is unchanged. There is no mass lesion. There is no midline shift. Vascular: No hyperdense vessel or unexpected calcification. Skull: Normal. Negative for fracture or focal lesion. Sinuses/Orbits: The paranasal sinuses are clear. Bilateral lens implants are in place. The globes and orbits are otherwise unremarkable. Other: None. IMPRESSION: 1. Expected evolution of the intraparenchymal hematoma centered in the  left caudate head with overall decreased size and density, and decreased surrounding edema with re-expansion of the left frontal horn. Previously seen intraventricular blood products have resolved. 2. No new intracranial hemorrhage, extra-axial fluid collection, or infarct. Electronically Signed   By: Valetta Mole M.D.   On: 06/19/2021 16:37   CT HEAD WO CONTRAST (5MM)  Result Date: 06/05/2021 CLINICAL DATA:  Follow-up hemorrhagic stroke. EXAM: CT HEAD WITHOUT CONTRAST TECHNIQUE: Contiguous axial images were obtained from the base of the skull through the vertex without intravenous contrast. COMPARISON:  06/04/2021 FINDINGS: Brain: Hematoma below the left caudate head measuring up to 2.5 cm on axial slices. Intraventricular extension with clot in the left more than right lateral ventricles and in the third ventricle. Small volume clot in the fourth ventricle. No hydrocephalus. Increased edema around the hematoma, expected. No worrisome mass effect. Underlying chronic small vessel ischemia and cerebral volume loss. No evidence of cortical infarction. Vascular: No hyperdense vessel or unexpected calcification. Skull: Normal. Negative for fracture or focal lesion. Sinuses/Orbits: No acute finding. IMPRESSION: Unchanged parenchymal hemorrhage with intraventricular extension. No hydrocephalus. Electronically Signed   By: Jorje Guild M.D.   On: 06/05/2021 05:32   MR ANGIO HEAD WO CONTRAST  Result Date: 06/05/2021 CLINICAL DATA:  Follow-up examination for stroke. EXAM: MRI HEAD WITHOUT CONTRAST MRA HEAD WITHOUT CONTRAST TECHNIQUE: Multiplanar, multi-echo pulse sequences of the brain and surrounding structures were acquired without intravenous contrast. Angiographic images of the Circle of Willis were acquired using MRA technique without intravenous contrast. COMPARISON:  Prior CT from earlier the same day. FINDINGS: MRI HEAD FINDINGS Brain: Examination moderately to severely degraded by motion artifact.  Additionally, the patient was unable to tolerate the full length of the exam. Cerebral volume within normal limits for age. Moderate chronic microvascular ischemic disease noted involving the periventricular deep white matter both cerebral hemispheres. Previously identified intraparenchymal hematoma centered at the anterior left basal  ganglia of is grossly stable in size as compared to previous. Surrounding vasogenic edema and mild regional mass effect. Left lateral ventricle is partially effaced without significant midline shift. No visible underlying lesion on this motion degraded noncontrast MRI. Intraventricular extension with blood within the left greater than right lateral ventricles. Stable ventricular size and morphology without progressive hydrocephalus or trapping. No other evidence for acute or subacute infarct. Gray-white matter differentiation otherwise maintained. No other visible areas of chronic cortical infarction. No other acute intracranial hemorrhage. No other significant chronic blood products elsewhere within the brain. No visible mass lesion or extra-axial fluid collection. Pituitary gland suprasellar region grossly normal. Midline structures intact. Vascular: Major intracranial vascular flow voids are maintained. Skull and upper cervical spine: Craniocervical junction grossly normal. Bone marrow signal intensity within normal limits. No scalp soft tissue abnormality. Sinuses/Orbits: Patient status post bilateral ocular lens replacement. Paranasal sinuses are largely clear. No significant mastoid effusion. Other: None. MRA HEAD FINDINGS Anterior circulation: Examination moderately to severely degraded by motion artifact. Visualized distal cervical segments of the internal carotid arteries are patent with antegrade flow. Petrous, cavernous, and supraclinoid segments patent without significant stenosis or other visible abnormality. A1 segments patent bilaterally. Normal anterior communicating  artery complex. Anterior cerebral arteries limited assessment due to motion, but appear to be grossly patent to their distal aspects on 3D time-of-flight sequence. No visible M1 stenosis or occlusion. Grossly negative MCA bifurcations. Distal MCA branches grossly perfused and symmetric. Posterior circulation: Right vertebral artery strongly dominant and widely patent to the vertebrobasilar junction. Left vertebral artery hypoplastic and grossly patent as well. Both PICA grossly patent at their origins. Basilar patent to its distal aspect without stenosis. Superior cerebellar arteries patent bilaterally. Right PCA supplied via the basilar. Predominant fetal type origin left PCA. Both PCAs grossly patent to their distal aspects without stenosis. Anatomic variants: Fetal type origin of the left PCA. No visible aneurysm. No visible vascular abnormality seen underlying the left basal ganglia hemorrhage. IMPRESSION: MRI HEAD IMPRESSION: 1. Motion degraded exam. 2. Grossly stable appearance of intraparenchymal hemorrhage centered at the anterior left basal ganglia with surrounding vasogenic edema and mild regional mass effect. No visible underlying lesion on this motion degraded noncontrast MRI. 3. Intraventricular extension with blood within the left greater than right lateral ventricles. Stable ventricular size and morphology without progressive hydrocephalus or trapping. 4. No other acute intracranial abnormality. MRA HEAD IMPRESSION: 1. Technically limited exam due to extensive motion artifact. 2. Grossly negative intracranial MRA, with no large vessel occlusion, hemodynamically significant stenosis, or other acute vascular abnormality. No visible vascular abnormality seen underlying the left basal ganglia hemorrhage. No visible aneurysm. Electronically Signed   By: Jeannine Boga M.D.   On: 06/05/2021 21:42   MR BRAIN WO CONTRAST  Result Date: 06/05/2021 CLINICAL DATA:  Follow-up examination for stroke.  EXAM: MRI HEAD WITHOUT CONTRAST MRA HEAD WITHOUT CONTRAST TECHNIQUE: Multiplanar, multi-echo pulse sequences of the brain and surrounding structures were acquired without intravenous contrast. Angiographic images of the Circle of Willis were acquired using MRA technique without intravenous contrast. COMPARISON:  Prior CT from earlier the same day. FINDINGS: MRI HEAD FINDINGS Brain: Examination moderately to severely degraded by motion artifact. Additionally, the patient was unable to tolerate the full length of the exam. Cerebral volume within normal limits for age. Moderate chronic microvascular ischemic disease noted involving the periventricular deep white matter both cerebral hemispheres. Previously identified intraparenchymal hematoma centered at the anterior left basal ganglia of is grossly stable in size as  compared to previous. Surrounding vasogenic edema and mild regional mass effect. Left lateral ventricle is partially effaced without significant midline shift. No visible underlying lesion on this motion degraded noncontrast MRI. Intraventricular extension with blood within the left greater than right lateral ventricles. Stable ventricular size and morphology without progressive hydrocephalus or trapping. No other evidence for acute or subacute infarct. Gray-white matter differentiation otherwise maintained. No other visible areas of chronic cortical infarction. No other acute intracranial hemorrhage. No other significant chronic blood products elsewhere within the brain. No visible mass lesion or extra-axial fluid collection. Pituitary gland suprasellar region grossly normal. Midline structures intact. Vascular: Major intracranial vascular flow voids are maintained. Skull and upper cervical spine: Craniocervical junction grossly normal. Bone marrow signal intensity within normal limits. No scalp soft tissue abnormality. Sinuses/Orbits: Patient status post bilateral ocular lens replacement. Paranasal  sinuses are largely clear. No significant mastoid effusion. Other: None. MRA HEAD FINDINGS Anterior circulation: Examination moderately to severely degraded by motion artifact. Visualized distal cervical segments of the internal carotid arteries are patent with antegrade flow. Petrous, cavernous, and supraclinoid segments patent without significant stenosis or other visible abnormality. A1 segments patent bilaterally. Normal anterior communicating artery complex. Anterior cerebral arteries limited assessment due to motion, but appear to be grossly patent to their distal aspects on 3D time-of-flight sequence. No visible M1 stenosis or occlusion. Grossly negative MCA bifurcations. Distal MCA branches grossly perfused and symmetric. Posterior circulation: Right vertebral artery strongly dominant and widely patent to the vertebrobasilar junction. Left vertebral artery hypoplastic and grossly patent as well. Both PICA grossly patent at their origins. Basilar patent to its distal aspect without stenosis. Superior cerebellar arteries patent bilaterally. Right PCA supplied via the basilar. Predominant fetal type origin left PCA. Both PCAs grossly patent to their distal aspects without stenosis. Anatomic variants: Fetal type origin of the left PCA. No visible aneurysm. No visible vascular abnormality seen underlying the left basal ganglia hemorrhage. IMPRESSION: MRI HEAD IMPRESSION: 1. Motion degraded exam. 2. Grossly stable appearance of intraparenchymal hemorrhage centered at the anterior left basal ganglia with surrounding vasogenic edema and mild regional mass effect. No visible underlying lesion on this motion degraded noncontrast MRI. 3. Intraventricular extension with blood within the left greater than right lateral ventricles. Stable ventricular size and morphology without progressive hydrocephalus or trapping. 4. No other acute intracranial abnormality. MRA HEAD IMPRESSION: 1. Technically limited exam due to  extensive motion artifact. 2. Grossly negative intracranial MRA, with no large vessel occlusion, hemodynamically significant stenosis, or other acute vascular abnormality. No visible vascular abnormality seen underlying the left basal ganglia hemorrhage. No visible aneurysm. Electronically Signed   By: Jeannine Boga M.D.   On: 06/05/2021 21:42   ECHOCARDIOGRAM COMPLETE  Result Date: 06/05/2021    ECHOCARDIOGRAM REPORT   Patient Name:   Shelby Hill Date of Exam: 06/05/2021 Medical Rec #:  [id number]    Height:       66.0 in Accession #:    [id number]   Weight:       172.6 lb Date of Birth:  03/25/1936   BSA:          1.879 m Patient Age:    69 years     BP:           154/69 mmHg Patient Gender: F            HR:           87 bpm. Exam Location:  Inpatient Procedure: 2D Echo Indications:  Stroke  History:        Patient has no prior history of Echocardiogram examinations.  Sonographer:    Arlyss Gandy Referring Phys: [place]  1. Left ventricular ejection fraction, by estimation, is 60 to 65%. The left ventricle has normal function. The left ventricle has no regional wall motion abnormalities. Left ventricular diastolic parameters are consistent with Grade I diastolic dysfunction (impaired relaxation).  2. Right ventricular systolic function is normal. The right ventricular size is normal. Tricuspid regurgitation signal is inadequate for assessing PA pressure.  3. The mitral valve is normal in structure. No evidence of mitral valve regurgitation. No evidence of mitral stenosis.  4. The aortic valve is tricuspid. Aortic valve regurgitation is not visualized. Aortic valve sclerosis is present, with no evidence of aortic valve stenosis.  5. The inferior vena cava is normal in size with greater than 50% respiratory variability, suggesting right atrial pressure of 3 mmHg. Conclusion(s)/Recommendation(s): No intracardiac source of embolism detected on this transthoracic study. Consider a  transesophageal echocardiogram to exclude cardiac source of embolism if clinically indicated. FINDINGS  Left Ventricle: Left ventricular ejection fraction, by estimation, is 60 to 65%. The left ventricle has normal function. The left ventricle has no regional wall motion abnormalities. The left ventricular internal cavity size was normal in size. There is  no left ventricular hypertrophy. Left ventricular diastolic parameters are consistent with Grade I diastolic dysfunction (impaired relaxation). Indeterminate filling pressures. Right Ventricle: The right ventricular size is normal. No increase in right ventricular wall thickness. Right ventricular systolic function is normal. Tricuspid regurgitation signal is inadequate for assessing PA pressure. Left Atrium: Left atrial size was normal in size. Right Atrium: Right atrial size was normal in size. Pericardium: There is no evidence of pericardial effusion. Mitral Valve: The mitral valve is normal in structure. No evidence of mitral valve regurgitation. No evidence of mitral valve stenosis. Tricuspid Valve: The tricuspid valve is normal in structure. Tricuspid valve regurgitation is not demonstrated. No evidence of tricuspid stenosis. Aortic Valve: The aortic valve is tricuspid. Aortic valve regurgitation is not visualized. Aortic valve sclerosis is present, with no evidence of aortic valve stenosis. Aortic valve mean gradient measures 5.0 mmHg. Aortic valve peak gradient measures 9.7  mmHg. Aortic valve area, by VTI measures 1.71 cm. Pulmonic Valve: The pulmonic valve was normal in structure. Pulmonic valve regurgitation is not visualized. No evidence of pulmonic stenosis. Aorta: The aortic root is normal in size and structure. Venous: The inferior vena cava is normal in size with greater than 50% respiratory variability, suggesting right atrial pressure of 3 mmHg. IAS/Shunts: No atrial level shunt detected by color flow Doppler.  LEFT VENTRICLE PLAX 2D LVIDd:          4.19 cm   Diastology LVIDs:         3.15 cm   LV e' medial:    5.00 cm/s LV PW:         1.01 cm   LV E/e' medial:  14.5 LV IVS:        0.98 cm   LV e' lateral:   6.64 cm/s LVOT diam:     2.00 cm   LV E/e' lateral: 10.9 LV SV:         53 LV SV Index:   28 LVOT Area:     3.14 cm  RIGHT VENTRICLE RV S prime:     13.20 cm/s TAPSE (M-mode): 1.9 cm LEFT ATRIUM  Index LA diam:        2.60 cm 1.38 cm/m LA Vol (A2C):   38.7 ml 20.60 ml/m LA Vol (A4C):   29.5 ml 15.70 ml/m LA Biplane Vol: 33.9 ml 18.05 ml/m  AORTIC VALVE AV Area (Vmax):    1.65 cm AV Area (Vmean):   1.64 cm AV Area (VTI):     1.71 cm AV Vmax:           156.00 cm/s AV Vmean:          106.000 cm/s AV VTI:            0.310 m AV Peak Grad:      9.7 mmHg AV Mean Grad:      5.0 mmHg LVOT Vmax:         82.00 cm/s LVOT Vmean:        55.300 cm/s LVOT VTI:          0.169 m LVOT/AV VTI ratio: 0.55  AORTA Ao Root diam: 3.00 cm Ao Asc diam:  3.00 cm MITRAL VALVE MV Area (PHT): 4.17 cm    SHUNTS MV Decel Time: 182 msec    Systemic VTI:  0.17 m MV E velocity: 72.40 cm/s  Systemic Diam: 2.00 cm MV A velocity: 86.50 cm/s MV E/A ratio:  0.84 Mihai Croitoru MD Electronically signed by Sanda Klein MD Signature Date/Time: 06/05/2021/11:10:14 AM    Final    CT HEAD CODE STROKE WO CONTRAST  Result Date: 06/04/2021 CLINICAL DATA:  Code stroke. EXAM: CT HEAD WITHOUT CONTRAST TECHNIQUE: Contiguous axial images were obtained from the base of the skull through the vertex without intravenous contrast. COMPARISON:  No prior head CT, correlation is made with MRI brain 01/13/2021. FINDINGS: Brain: High density material, likely hemorrhage, centered in the left basal ganglia, extending into the left lateral ventricle, third ventricle, and fourth ventricle. No hydrocephalus. The parenchymal portion hemorrhage measures up to 2.8 x 2.0 x 1.4 cm (series 3, image 11 and series 5, image 30). There is mild surrounding hypodensity, likely edema. No significant mass effect  or midline shift. No extra-axial collection. Periventricular white matter changes, likely the sequela of chronic small vessel ischemic disease. No acute infarct. Vascular: No hyperdense vessel or unexpected calcification. Skull: Normal. Negative for fracture or focal lesion. Sinuses/Orbits: No acute finding. Other: The mastoid air cells are well aerated. IMPRESSION: 1. Intraparenchymal hemorrhage, centered in the left basal ganglia, with mild surrounding edema but without significant mass effect or midline shift. 2. Intraventricular extension into the left lateral ventricle, third ventricle, and fourth ventricle. No hydrocephalus. Code stroke imaging results were communicated on 06/04/2021 at 5:12 pm to provider Dr. Cheral Marker via secure text paging. Electronically Signed   By: Merilyn Baba M.D.   On: 06/04/2021 17:15   VAS US CAROTID  Result Date: 06/06/2021 Carotid Arterial Duplex Study Patient Name:  Shelby Hill  Date of Exam:   06/05/2021 Medical Rec #: [id number]     Accession #:    [id number] Date of Birth: Oct 06, 1935    Patient Gender: F Patient Age:   11 years Exam Location:  [facility] Procedure:      VAS US CAROTID Referring Phys: Cornelius Moras XU --------------------------------------------------------------------------------  Indications:       CVA. Risk Factors:      Hyperlipidemia. Other Factors:     Intracranial hemorrhage. Comparison Study:  No prior studies. Performing Technologist: Darlin Coco RDMS, RVT  Examination Guidelines: A complete evaluation includes B-mode imaging, spectral Doppler, color  Doppler, and power Doppler as needed of all accessible portions of each vessel. Bilateral testing is considered an integral part of a complete examination. Limited examinations for reoccurring indications may be performed as noted.  Right Carotid Findings: +----------+--------+--------+--------+------------------+------------------+             PSV cm/s EDV cm/s Stenosis Plaque Description Comments             +----------+--------+--------+--------+------------------+------------------+  CCA Prox   53       11                                                       +----------+--------+--------+--------+------------------+------------------+  CCA Distal 77       12                                   intimal thickening  +----------+--------+--------+--------+------------------+------------------+  ICA Prox   42       9                                                        +----------+--------+--------+--------+------------------+------------------+  ICA Distal 30       18                                   tortuous            +----------+--------+--------+--------+------------------+------------------+  ECA        87       9                                                        +----------+--------+--------+--------+------------------+------------------+ +----------+--------+-------+----------------+-------------------+             PSV cm/s EDV cms Describe         Arm Pressure (mmHG)  +----------+--------+-------+----------------+-------------------+  Subclavian 99               Multiphasic, WNL                      +----------+--------+-------+----------------+-------------------+ +---------+--------+--+--------+--+---------+  Vertebral PSV cm/s 40 EDV cm/s 13 Antegrade  +---------+--------+--+--------+--+---------+  Left Carotid Findings: +----------+--------+--------+--------+-------------------------+--------+             PSV cm/s EDV cm/s Stenosis Plaque Description        Comments  +----------+--------+--------+--------+-------------------------+--------+  CCA Prox   97       11                                                    +----------+--------+--------+--------+-------------------------+--------+  CCA Distal 66       15                                                    +----------+--------+--------+--------+-------------------------+--------+  ICA Prox   44       10       1-39%    calcific and  heterogenous           +----------+--------+--------+--------+-------------------------+--------+  ICA Distal 64       22                                                    +----------+--------+--------+--------+-------------------------+--------+  ECA        92       8                                                     +----------+--------+--------+--------+-------------------------+--------+ +----------+--------+--------+----------------+-------------------+             PSV cm/s EDV cm/s Describe         Arm Pressure (mmHG)  +----------+--------+--------+----------------+-------------------+  Subclavian 137               Multiphasic, WNL                      +----------+--------+--------+----------------+-------------------+ +---------+--------+--+--------+-+---------+  Vertebral PSV cm/s 51 EDV cm/s 7 Antegrade  +---------+--------+--+--------+-+---------+   Summary: Right Carotid: The extracranial vessels were near-normal with only minimal wall                thickening or plaque. Left Carotid: Velocities in the left ICA are consistent with a 1-39% stenosis. Vertebrals:  Bilateral vertebral arteries demonstrate antegrade flow. Subclavians: Normal flow hemodynamics were seen in bilateral subclavian              arteries. *See table(s) above for measurements and observations.  Electronically signed by Antony Contras MD on 06/06/2021 at 8:18:05 AM.    Final     Labs:  Basic Metabolic Panel: Recent Labs  Lab 06/16/21 1507 06/21/21 0544  NA 137 137  K 4.1 3.8  CL 102 102  CO2 26 27  GLUCOSE 116* 98  BUN 14 17  CREATININE 0.86 0.78  CALCIUM 9.3 9.0    CBC: Recent Labs  Lab 06/16/21 1507 06/21/21 0544  WBC 6.5 5.4  NEUTROABS 4.3  --   HGB 13.2 12.7  HCT 40.2 38.9  MCV 91.8 91.7  PLT 216 200    CBG: No results for input(s): GLUCAP in the last 168 hours.  Brief HPI:   Shelby Hill is a 86 y.o. female who developed confusion and word finding difficulty on 06/04/2021.  She was brought to [facility] emergency department and code stroke initiated.  CT significant for left basal ganglia intraparenchymal hemorrhage.  Neurology consulted and given labetalol to improve blood pressure which was in the 220U systolic.  No antiplatelet or anticoagulants given secondary to cerebral bleed.   Hospital Course: Shelby Hill was admitted to rehab 06/07/2021 for inpatient therapies to consist of PT, ST and OT at least three hours five days a week. Past admission physiatrist, therapy team and rehab RN have worked together to provide customized collaborative inpatient rehab.  Initial assessment, she was noted to have an essential tremor and epanolol was initiated at nighttime.  This was increased to twice daily on 1/13. Oral intake  was encouraged. She also exhibited some receptive as well as expressive asphasia and SLP and staff worked with patient and family to provide re-orientation and familiarity. On the evening of 1/16, she became agitated and aggressive around 4 pm after her family left for the day.  Due to physical aggression toward staff, Ativan IM was administered and the patient was placed in soft, bilateral wrist restraints for safety. Urine culture and UA obtained>>both negative for UTI. Seroquel 12.5 mg at 1600 and at 2100 daily initiated on 06/12/2021. Improvement in sundowning symptoms. Increased agitation noted over 1/21-1/22 and family discussion held regarding slow cognitive progress.  CT head ordered 1/23, but patient not cooperative to carry out exam. A second attempt on 1/24 was successful and showed resolving hematoma and decreased surrounding edema and no new findings. Patient was stable for discharge home with family on 06/22/2021.   Blood pressures were monitored on TID basis and remained stable.  Diabetes has been monitored with ac/hs CBG checks and carb modified diet with good control. CBG checks discontinued after several days.   Rehab course: During patient's stay in rehab weekly team  conferences were held to monitor patient's progress, set goals and discuss barriers to discharge. At admission, patient required skilled SLP intervention to maximize her cognitive-linguistic functional independence and skilled PT intervention to maximize safe functional ability  She  has had improvement in activity tolerance, balance, postural control as well as ability to compensate for deficits. She has had improvement in functional use RUE/LUE  and RLE/LLE as well as improvement in awareness   Disposition: Home with husband and daughter Discharge disposition: 01-Home or Self Care      Diet: Heart healthy, carb modified  Special Instructions:  No driving, alcohol consumption or tobacco use.   Advised husband and daughter to use Xanax 0.25  to 0.5 mg as needed in the mid-afternoon for agitation not controlled with Seroquel.  30-35 minutes were spent on discharge planning and discharge summary.  Discharge Instructions     Ambulatory referral to Physical Medicine Rehab   Complete by: As directed    Moderate complexity ICH   Discharge patient   Complete by: As directed    Discharge disposition: 01-Home or Self Care   Discharge patient date: 06/22/2021      Allergies as of 06/22/2021       Reactions   Codeine Nausea And Vomiting        Medication List     STOP taking these medications    ibuprofen 200 MG tablet Commonly known as: ADVIL   senna-docusate 8.6-50 MG tablet Commonly known as: Senokot-S       TAKE these medications    acetaminophen 325 MG tablet Commonly known as: TYLENOL Take 1-2 tablets (325-650 mg total) by mouth every 4 (four) hours as needed for mild pain. What changed:  how much to take reasons to take this   ALPRAZolam 0.5 MG tablet Commonly known as: XANAX Take 1 tablet (0.5 mg total) by mouth at bedtime as needed for anxiety or sleep. May use for agitation in the afternoon if Seroquel not effective. What changed:  how much to  take additional instructions   calcitonin (salmon) 200 UNIT/ACT nasal spray Commonly known as: MIACALCIN/FORTICAL Place 1 spray into alternate nostrils daily.   DULoxetine 60 MG capsule Commonly known as: CYMBALTA TAKE 1 CAPSULE BY MOUTH DAILY What changed: how to take this   Ensure Max Protein Liqd Take 330 mLs (11 oz total) by mouth daily.  lamoTRIgine 25 MG tablet Commonly known as: LaMICtal Take 25 mg twice daily What changed:  how much to take how to take this when to take this   pregabalin 25 MG capsule Commonly known as: Lyrica Take 1 capsule (25 mg total) by mouth 2 (two) times daily.   propranolol 10 MG tablet Commonly known as: INDERAL Take 1 tablet (10 mg total) by mouth 2 (two) times daily.   QUEtiapine 25 MG tablet Commonly known as: SEROQUEL Take 1 tablet (25 mg total) by mouth 2 (two) times daily. Take one tablet at 4 pm and one at bedtime.   rosuvastatin 5 MG tablet Commonly known as: CRESTOR Take 5 mg by mouth at bedtime.        Follow-up Information     Vyas, Costella Hatcher, MD Follow up.   Specialty: Internal Medicine Why: Call on Monday to arrange hospital follow-up Contact information: [address] [place] [address] [place]         Meredith Staggers, MD Follow up.   Specialty: Physical Medicine and Rehabilitation Why: office will call you to arrange your appt (sent) Contact information: [place] [address] [phone]         [facility] Follow up.   Why: Call on Monday to arrange hospital/stroke follow-up Contact information: [address]     [place] [phone]                Signed: Barbie Banner 06/22/2021, 11:37 AM

## 2021-06-08 NOTE — Progress Notes (Signed)
Inpatient Rehabilitation Care Coordinator Assessment and Plan Patient Details  Name: Shelby Hill MRN: [id number] Date of Birth: [birth year]  Today's Date: 06/08/2021  Hospital Problems: Principal Problem:   ICH (intracerebral hemorrhage) ([place])  Past Medical History:  Past Medical History:  Diagnosis Date   BCC (basal cell carcinoma) 02/09/1988   left upper thigh,left lower thigh tx cx3 20f   BCC (basal cell carcinoma) 10/04/1962   left clavicle,left breast, Back   BCC (basal cell carcinoma) 03/06/1989   mid back   BCC (basal cell carcinoma) 04/02/1990   left upper shoulder (CX35FU),left upper breast(CX35FU),above left clavicle, right outer back,   BCC (basal cell carcinoma) 04/01/1991   right cetner outer brown (CX3+exc. ), upper left chest,upper left chest medial, right back   BCC (basal cell carcinoma) 02/01/1994   right back   BCC (basal cell carcinoma) 03/26/1995   central upper back (CX35FU),left upper back (CX35FU)   BCC (basal cell carcinoma) 12/30/1997   right cheek (exc. )   BCC (basal cell carcinoma) 08/06/1999   upper right shin (CX35FU), upper right shin (CX35FU)   BCC (basal cell carcinoma) 08/24/1998   left breast (CX35FU)   BCC (basal cell carcinoma) 08/05/2001   right inner cheek (MOHS), Left upper back (CX35FU),right upper back (CX35FU),, right top hand (CX35FU)   BCC (basal cell carcinoma) 08/02/2002   upper back(CX35FU)   BCC (basal cell carcinoma) 09/12/2003   right mid back (CX35FU), right thigh (deep freeze+aldara) Left thigh outer (deep freeze +aldara)   BCC (basal cell carcinoma) 06/03/2005   rigth scapula (CX35FU),    BCC (basal cell carcinoma) 06/05/2006   right ant. 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 (CX35FU)   SCC (squamous cell carcinoma) 06/05/2006   mid chest (CX35FU)   SCC (squamous cell carcinoma) 08/08/2010   left temple (CX35FU), Left upper arm (CX35FU), right forearm/wrist (CX35FU)   SCC (squamous cell carcinoma) 02/06/2012   Left post neck-tx p bx   SCC (squamous cell carcinoma) 11/09/2012   left hand-tx p bx-, left chest sup -tx p bx, left chest inf. (CX35FU)   SCC (squamous cell carcinoma) 08/04/2014   left clavicle-tx p bx   SCC (squamous cell carcinoma) 11/30/2014   lower right leg distal (CX35FU), lower right leg, prox. (CX35FU)   SCC (squamous cell carcinoma) 05/17/2015   right jawline (CX35FU), Left sideburn (CX35FU)   SCC (squamous cell carcinoma) 09/10/2017   left forearm-tx p bx, left temple -tx p bx   Past Surgical History:  Past Surgical History:  Procedure Laterality Date   COLONOSCOPY  01/01/2012   Procedure: COLONOSCOPY;  Surgeon: NRogene Houston MD;  [facility];  Service: Endoscopy;  Laterality: N/A;  830   INTRAMEDULLARY (IM) NAIL INTERTROCHANTERIC Left 04/26/2017   Procedure: INTRAMEDULLARY (IM) NAIL INTERTROCHANTRIC;  Surgeon: XLeandrew Koyanagi MD;  [facility]  Service: Orthopedics;  Laterality: Left;   KNEE SURGERY  ORIF TIBIA PLATEAU     Social History:  reports that she has never smoked. She has never used smokeless tobacco. She reports current alcohol use. She reports that she does not use drugs.  Family / Support Systems Marital Status: Married How Long?: 56 years Patient Roles: Spouse, Parent Spouse/Significant Other: [name] (husband) [phone] Children: 3 adult children- Gwyndolyn Saxon (son; lives in [place]), Mustang (dtr;  lives in [place]) and Hayward (lives in [place]) Other Supports: friends/neighbors Anticipated Caregiver: Husband Ability/Limitations of Caregiver: Pt husband will be primary caregiver. Pt son reports there will be check-ins from children and friends/neighbors. Pt husband uses cane due to old CVA, and he admits to some memory deficits as well. Caregiver Availability: 24/7 Family Dynamics: Pt lives with her husband [name].  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 Education: college Health Literacy - How often do you need to have someone help you when you read instructions, pamphlets, or other written material from your doctor or pharmacy?: Never Writes: Yes Employment Status: Retired Date Retired/Disabled/Unemployed: 2005/2006 Public relations account executive Issues: Denies Guardian/Conservator: N/A   Abuse/Neglect Abuse/Neglect Assessment Can Be Completed: Unable to assess, patient is non-responsive or altered mental status Physical Abuse: Denies Verbal Abuse: Denies Sexual Abuse: Denies Exploitation of patient/patient's resources: Denies Self-Neglect: Denies Possible abuse reported to:: Other (Comment)  Patient response to: Social Isolation - How often do you feel lonely or isolated from those around you?: Patient unable to respond  Emotional Status Pt's affect, behavior and adjustment status: Pt in good spirits at time of visit, however easily distracted in course of conversation, and severe difficulty in communicating. Recent Psychosocial Issues: Denies Psychiatric History: Denies Substance Abuse History: Pt admits to an occassional glass of wine.  Patient / Family Perceptions, Expectations & Goals Pt/Family understanding of illness & functional limitations: Pt family appear to have a general understanding of pt care needs Premorbid pt/family roles/activities: Independent Anticipated changes in  roles/activities/participation: Assistance with ADLs/IADLs Pt/family expectations/goals: Pt unable to articulate her goal. Family goal is for pt to be "more mobvile, independent,a nd work on Clinical research associate; whatever therapy intends to do."  [employer]: None Premorbid Home Care/DME Agencies: Other (Comment) (Husband reports they have a person they hired to come in the home once a week for 3-4 hrs to help with housekeeping) Transportation available at discharge: TBD Is the patient able to respond to transportation needs?: Yes In the past 12 months, has lack of transportation kept you from medical appointments or from getting medications?: No In the past 12 months, has lack of transportation kept you from meetings, work, or from getting things needed for daily living?: No Resource referrals recommended: Neuropsychology  Discharge Planning Living Arrangements: Alone Support Systems: Spouse/significant other, Children, Friends/neighbors Type of Residence: Private residence Insurance Resources: Multimedia programmer (specify) Personnel officer Medicare) Financial Resources: Social Security Financial Screen Referred: No Living Expenses: Own Money Management: Patient Does the patient have any problems obtaining your medications?: No Home Management: Pt husban manages homecare needs. He reports they order in food to the home, and also go out to eat often. Patient/Family Preliminary Plans: No changes Care Coordinator Barriers to Discharge: Decreased caregiver support, Lack of/limited family support Care Coordinator Barriers to Discharge Comments: Husband will be patient's primary caregiver. Care Coordinator Anticipated Follow Up Needs: HH/OP  Clinical Impression SW met with pt and pt son Gwyndolyn Saxon in room to introduce self, explain role, and discuss discharge process. Pt husband  arrived during assessment, and helped answer majority of questions. Pt is not a English as a second language teacher. No HCPOA. DME: cane,  crutches, RW, and TTB. Family designate pt dtr [name] [phone]) as caregiver and ask that she be called along with pt husband. Aware SW will follow-up after team conference on Tuesday with updates.   Marcus Groll A Bernie Ransford 06/08/2021, 2:08 PM

## 2021-06-08 NOTE — Evaluation (Signed)
Speech Language Pathology Assessment and Plan  Patient Details  Name: Shelby Hill MRN: [id number] Date of Birth: 1936/05/12  SLP Diagnosis: Cognitive Impairments;Aphasia  Rehab Potential: Excellent ELOS: 2 weeks    Today's Date: 06/08/2021 SLP Individual Time: 5621-3086 SLP Individual Time Calculation (min): 75 min   Hospital Problem: Principal Problem:   ICH (intracerebral hemorrhage) ([place])  Past Medical History:  Past Medical History:  Diagnosis Date   BCC (basal cell carcinoma) 02/09/1988   left upper thigh,left lower thigh tx cx3 43f   BCC (basal cell carcinoma) 10/04/1962   left clavicle,left breast, Back   BCC (basal cell carcinoma) 03/06/1989   mid back   BCC (basal cell carcinoma) 04/02/1990   left upper shoulder (CX35FU),left upper breast(CX35FU),above left clavicle, right outer back,   BCC (basal cell carcinoma) 04/01/1991   right cetner outer brown (CX3+exc. ), upper left chest,upper left chest medial, right back   BCC (basal cell carcinoma) 02/01/1994   right back   BCC (basal cell carcinoma) 03/26/1995   central upper back (CX35FU),left upper back (CX35FU)   BCC (basal cell carcinoma) 12/30/1997   right cheek (exc. )   BCC (basal cell carcinoma) 08/06/1999   upper right shin (CX35FU), upper right shin (CX35FU)   BCC (basal cell carcinoma) 08/24/1998   left breast (CX35FU)   BCC (basal cell carcinoma) 08/05/2001   right inner cheek (MOHS), Left upper back (CX35FU),right upper back (CX35FU),, right top hand (CX35FU)   BCC (basal cell carcinoma) 08/02/2002   upper back(CX35FU)   BCC (basal cell carcinoma) 09/12/2003   right mid back (CX35FU), right thigh (deep freeze+aldara) Left thigh outer (deep freeze +aldara)   BCC (basal cell carcinoma) 06/03/2005   rigth scapula (CX35FU),    BCC (basal cell carcinoma) 06/05/2006   right ant. 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 (CX35FU)   SCC (squamous cell carcinoma) 06/05/2006   mid chest (CX35FU)   SCC (squamous cell carcinoma) 08/08/2010   left temple (CX35FU), Left upper arm (CX35FU), right forearm/wrist (CX35FU)   SCC (squamous cell carcinoma) 02/06/2012   Left post neck-tx p bx   SCC (squamous cell carcinoma) 11/09/2012   left hand-tx p bx-, left chest sup -tx p bx, left chest inf. (CX35FU)   SCC (squamous cell carcinoma) 08/04/2014   left clavicle-tx p bx   SCC (squamous cell carcinoma) 11/30/2014   lower right leg distal (CX35FU), lower right leg, prox. (CX35FU)   SCC (squamous cell carcinoma) 05/17/2015   right jawline (CX35FU), Left sideburn (CX35FU)   SCC (squamous cell carcinoma) 09/10/2017   left forearm-tx p bx, left temple -tx p bx   Past Surgical History:  Past Surgical History:  Procedure Laterality Date   COLONOSCOPY  01/01/2012   Procedure: COLONOSCOPY;  Surgeon: NRogene Houston MD;  [facility];  Service: Endoscopy;  Laterality: N/A;  830   INTRAMEDULLARY (IM) NAIL INTERTROCHANTERIC Left 04/26/2017  Procedure: INTRAMEDULLARY (IM) NAIL INTERTROCHANTRIC;  Surgeon: Leandrew Koyanagi, MD;  Location: [place];  Service: Orthopedics;  Laterality: Left;   KNEE SURGERY     ORIF TIBIA PLATEAU      Assessment / Plan / Recommendation Clinical Impression Patient is an 86 year old female who was receiving a facial on 06/04/2018 3 in the afternoon when the technician noted confusion and answering questions inappropriately.  EMS was  called and the patient was brought to [facility] health ED as code stroke.  Her husband and daughter reported no mental status changes earlier that day.  She was evaluated and found to have confusion and word finding difficulty.  CT of the head was significant for left basal ganglia intraparenchymal hemorrhage with mild surrounding edema but no significant mass-effect or midline shift.  There was intraventricular extension into the left lateral ventricle, third ventricle and fourth ventricle without hydrocephalus.  Neurology was consulted and she was given labetalol to improve her blood pressure which was in the 390Z systolic to low 009Q diastolic.   Dr. Cheral Marker admitted the patient and likely etiology of her hemorrhage was most likely severe hypertension.  Goal blood pressure was 330-076 systolic.   MRI was performed to rule out other possible etiologies. Grossly stable appearance of intraparenchymal hemorrhage centered at the anterior left basal ganglia with surrounding vasogenic edema and mild regional mass effect. No visible underlying lesion on this motion degraded noncontrast MRI. Therapy evaluations completed with recommendations for CIR. Patient admitted 06/07/21.   Patient demonstrates a moderate aphasia with her receptive language appearing more intact than her expressive language. Patient can follow multi-step commands but requires extra time and repetition due to impaired working memory.  Patient also answered basic open-ended questions appropriately regarding biographical information but required moderate verbal cues to use clear and concise language. Patient would mostly verbalize at the sentence level with jargon and circumlocution with minimal awareness of errors. Patient demonstrated difficulty with writing basic biographical information with improved awareness of difficulty noted.  Mild-Mod deficits noted in sustained attention, working memory and awareness of deficits which impacts her safety with  functional and familiar tasks. Patient would benefit from skilled SLP intervention to maximize her cognitive-linguistic functioning and overall functional independence prior to discharge.    Skilled Therapeutic Interventions          Administered a cognitive-linguistic evaluation, please see above for details. SLP provided education to the patient's son regarding strategies to utilize to help maximize awareness of errors and use of concise language. He verbalized understanding. Patient left upright in wheelchair with alarm on and all needs within reach. Continue with current plan of care.    SLP Assessment  Patient will need skilled [place] [facility] [place] [facility] during CIR admission    Recommendations  Oral Care Recommendations: Oral care BID Patient destination: Home Follow up Recommendations: 24 hour supervision/assistance;Outpatient SLP Equipment Recommended: None recommended by SLP    SLP Frequency 3 to 5 out of 7 days   SLP Duration  SLP Intensity  SLP Treatment/Interventions 2 weeks  Minumum of 1-2 x/day, 30 to 90 minutes  Cognitive remediation/compensation;Speech/Language facilitation;Cueing hierarchy;Environmental controls;Therapeutic Activities;Functional tasks;Patient/family education;Internal/external aids    Pain No/Denies Pain   SLP Evaluation Cognition Overall Cognitive Status: Difficult to assess Arousal/Alertness: Awake/alert Orientation Level: Oriented to place Memory: Impaired Memory Impairment: Decreased recall of new information;Decreased short term memory Decreased Short Term Memory: Verbal basic;Functional basic Awareness: Impaired Awareness Impairment: Emergent impairment Problem Solving: Impaired Problem Solving Impairment: Functional basic Safety/Judgment: Impaired  Comprehension  Auditory Comprehension Overall Auditory Comprehension: Impaired Yes/No Questions: Impaired Basic Biographical Questions: 76-100% accurate Basic Immediate  Environment Questions: 75-100% accurate Complex Questions: 50-74% accurate Commands: Impaired One Step Basic Commands: 75-100% accurate Two Step Basic Commands: 75-100% accurate Multistep Basic Commands: 50-74% accurate Conversation: Simple Interfering Components: Working Field seismologist: Dispensing optician Discrimination: Within Function Limits Reading Comprehension Reading Status: Impaired Expression Expression Primary Mode of Expression: Verbal Verbal Expression Overall Verbal Expression: Impaired Initiation: No impairment Automatic Speech: Name;Social Response Level of Generative/Spontaneous Verbalization: Sentence Repetition: No impairment Naming: Impairment Responsive: 76-100% accurate Convergent: 75-100% accurate Divergent: 75-100% accurate Verbal Errors: Perseveration;Not aware of errors;Language of confusion Pragmatics: No impairment Non-Verbal Means of Communication: Not applicable Written Expression Dominant Hand: Right Written Expression: Exceptions to [facility] Oral Motor Oral Motor/Sensory Function Overall Oral Motor/Sensory Function: Mild impairment Facial ROM: Reduced right Facial Symmetry: Abnormal symmetry right Facial Strength: Reduced right Facial Sensation: Within Functional Limits Lingual ROM: Reduced right Lingual Symmetry: Abnormal symmetry right Lingual Strength: Reduced Lingual Sensation: Within Functional Limits Velum: Within Functional Limits Mandible: Within Functional Limits Motor Speech Overall Motor Speech: Appears within functional limits for tasks assessed Articulation: Within functional limitis Intelligibility: Intelligible  Care Tool Care Tool Cognition Ability to hear (with hearing aid or hearing appliances if normally used Ability to hear (with hearing aid or hearing appliances if normally used): 1. Minimal difficulty - difficulty in some environments (e.g. when person speaks  softly or setting is noisy)   Expression of Ideas and Wants Expression of Ideas and Wants: 2. Frequent difficulty - frequently exhibits difficulty with expressing needs and ideas   Understanding Verbal and Non-Verbal Content Understanding Verbal and Non-Verbal Content: 3. Usually understands - understands most conversations, but misses some part/intent of message. Requires cues at times to understand  Memory/Recall Ability      Short Term Goals: Week 1: SLP Short Term Goal 1 (Week 1): Patient will utilize multi-modal communication to demonstrate orientation to place, time and situation with Mod A multimodal cues. SLP Short Term Goal 2 (Week 1): Patient will answer direct, open-ended questions with only 1-2 word utterances with Mod A multimodal cues. SLP Short Term Goal 3 (Week 1): Patient will self-monitor and correct errors during written expression with Mod verbal and visual cues. SLP Short Term Goal 4 (Week 1): Patient will demosntrate sustained attention to functional tasks for 30 minutes with Min verbal cues for redirection.  Refer to Care Plan for Long Term Goals  Recommendations for other services: None   Discharge Criteria: Patient will be discharged from SLP if patient refuses treatment 3 consecutive times without medical reason, if treatment goals not met, if there is a change in medical status, if patient makes no progress towards goals or if patient is discharged from hospital.  The above assessment, treatment plan, treatment alternatives and goals were discussed and mutually agreed upon: by patient and by family  Ladonne Sharples 06/08/2021, 12:29 PM

## 2021-06-09 DIAGNOSIS — I610 Nontraumatic intracerebral hemorrhage in hemisphere, subcortical: Secondary | ICD-10-CM | POA: Diagnosis not present

## 2021-06-09 NOTE — Progress Notes (Signed)
Occupational Therapy Session Note  Patient Details  Name: Shelby Hill MRN: [id number] Date of Birth: Dec 05, 1935  Today's Date: 06/09/2021 OT Individual Time: 1300-1400 OT Individual Time Calculation (min): 60 min    Short Term Goals: Week 1:  OT Short Term Goal 1 (Week 1): Patient will complete LB dressing with min A OT Short Term Goal 2 (Week 1): Patient will maintain standing balance at the sink with CGA in preparation for BADL task OT Short Term Goal 3 (Week 1): Patient will orient shirt with min questioning cues.   Skilled Therapeutic Interventions/Progress Updates:    Pt received mid bathroom transfer with LPN Josh. Pt confused throughout session, tangential and perseverative speech. Pt completed toileting tasks, voiding urine, with min A to don new brief. Her DIL arrived for session and was supportive. Pt completed functional mobility at [facility] level throughout session, intermittently requiring cueing for more upright posture and knee extension when she began fatiguing. Functional task in the apt focused on working/STM, sustained attention, visual scanning, dynamic standing balance, and motor planning. Pt required max cueing for attention to task and working memory overall. She was able to complete simple problem solving task/sequencing task using functional food items to ensure tasks as personally relevant as possible. On the BITS pt completed visual scanning task with >3 sec longer delay on R quadrant scanning. Education provided to pt on deficits to improve awareness throughout session. She returned to her room and was left supine with all needs met, bed alarm set. DIL still present.   Therapy Documentation Precautions:  Precautions Precautions: Fall (Simultaneous filing. User may not have seen previous data.) Precaution Comments: R knee problems recently Restrictions Weight Bearing Restrictions: No  Therapy/Group: Individual Therapy  Curtis Sites 06/09/2021, 7:30 AM

## 2021-06-09 NOTE — Progress Notes (Signed)
Speech Language Pathology Daily Session Note  Patient Details  Name: Shelby Hill MRN: [id number] Date of Birth: [birth year]  Today's Date: 06/09/2021 SLP Individual Time: 0807-0900 SLP Individual Time Calculation (min): 53 min  Short Term Goals: Week 1: SLP Short Term Goal 1 (Week 1): Patient will utilize multi-modal communication to demonstrate orientation to place, time and situation with Mod A multimodal cues. SLP Short Term Goal 2 (Week 1): Patient will answer direct, open-ended questions with only 1-2 word utterances with Mod A multimodal cues. SLP Short Term Goal 3 (Week 1): Patient will self-monitor and correct errors during written expression with Mod verbal and visual cues. SLP Short Term Goal 4 (Week 1): Patient will demosntrate sustained attention to functional tasks for 30 minutes with Min verbal cues for redirection.  Skilled Therapeutic Interventions: Pt seen for skilled ST with focus on cognitive goals, pt in bed and agreeable to therapeutic tasks. Pt quite confused this date (morning session may contribute to confusion), requiring mod-max A cues for orientation and redirection throughout session. SLP posting external aids in room to attempt to increase recall and carryover of orientation concepts ([place], [place], Rehab, Stroke, family has personal belongings, calendar). Pt able to scan environment and utilize these visual aids with min-mod A. Pt perseverating on where her personal items were and frequent mentions of "[place]". SLP assisting patient with calling both daughter and husband to reduce anxiety and attempt to redirect to tasks. Pt benefits from slow, direct questions to reduce anxiety and increase communicative effectiveness. Pt very thankful for orientation assistance and more appropriate with slightly decreased confusion at end of session. Pt left in bed with alarm set and all needs within reach. Cont ST POC.   Pain Pain Assessment Pain Scale: 0-10 Pain Score: 0-No  pain  Therapy/Group: Individual Therapy  Dewaine Conger 06/09/2021, 12:12 PM

## 2021-06-09 NOTE — Progress Notes (Signed)
Physical Therapy Session Note  Patient Details  Name: Shelby Hill MRN: [id number] Date of Birth: Apr 14, 1936  Today's Date: 06/09/2021 PT Individual Time: 1105-1200 PT Individual Time Calculation (min): 55 min   Short Term Goals: Week 1:  PT Short Term Goal 1 (Week 1): Pt will perform bed mobility with CGA. PT Short Term Goal 2 (Week 1): Pt will perform sit to stand consistently with CGA. PT Short Term Goal 3 (Week 1): Pt will perform bed to chair consistently with CGA. PT Short Term Goal 4 (Week 1): Pt will ambulate x150' with CGA and LRAD.   Skilled Therapeutic Interventions/Progress Updates:   Pt received supine in bed and agreeable to PT. Supine>sit transfer with supervision assist and cues for scooting technique once at EOB. Pt required cues for orientation to date, location and situation initially.    Stand pivot transfer to [facility] with min assist. Gait training with RW x 157f with min assist noted to have decreased step length and increased R tendelenberg with fatigue.   Dynamic standing balance while completing foam block puzzle. No UE support x 3 with max fading to moderate cues for error detection and attention to reference to make corrections on low difficulty block puzzles. No LOB throughout with CGA for safery  Nustep reciprocal movement training x 6 minutes with therapeutic rest break upon completion cues for full ROM on the R intermittently   [facility] returned to room and [facility] and all needs met.   .      Therapy Documentation Precautions:  Precautions Precautions: Fall (Simultaneous filing. User may not have seen previous data.) Precaution Comments: R knee problems recently Restrictions Weight Bearing Restrictions: No  Pain: Pain Assessment Pain Scale: 0-10 Pain Score: 0-No pain    Therapy/Group: Individual Therapy  ALorie Phenix1/14/2023, 1:08 PM

## 2021-06-09 NOTE — Progress Notes (Signed)
Patient family Is asking to see if a regular diet would be appropriate for this patient

## 2021-06-09 NOTE — Progress Notes (Signed)
PROGRESS NOTE   Subjective/Complaints:  Pt aphasic , not sure why she is on rehab, oriented pt to CVA, GBO, Rehab ROS: Limited due to cognitive/behavioral    Objective:   No results found. Recent Labs    06/08/21 0519  WBC 6.7  HGB 12.4  HCT 37.8  PLT 203    Recent Labs    06/08/21 0519  NA 135  K 3.5  CL 100  CO2 25  GLUCOSE 95  BUN 13  CREATININE 0.73  CALCIUM 9.0     Intake/Output Summary (Last 24 hours) at 06/09/2021 3818 Last data filed at 06/08/2021 2200 Gross per 24 hour  Intake 480 ml  Output --  Net 480 ml         Physical Exam: Vital Signs Blood pressure (!) 139/59, pulse 89, temperature 97.8 F (36.6 C), temperature source Oral, resp. rate 16, height 5\' 6"  (1.676 m), weight 78.3 kg, SpO2 98 %.   General: No acute distress Mood and affect are appropriate Heart: Regular rate and rhythm no rubs murmurs or extra sounds Lungs: Clear to auscultation, breathing unlabored, no rales or wheezes Abdomen: Positive bowel sounds, soft nontender to palpation, nondistended Extremities: No clubbing, cyanosis, or edema Skin: No evidence of breakdown, no evidence of rash Neurologic: Cranial nerves II through XII intact, motor strength is 5/5 in bilateral deltoid, bicep, tricep, grip, hip flexor, knee extensors, ankle dorsiflexor and plantar flexor Sensory exam normal sensation to light touch and proprioception in bilateral upper and lower extremities Cerebellar exam normal finger to nose to finger as well as heel to shin in bilateral upper and lower extremities Musculoskeletal: Full range of motion in all 4 extremities. No joint swelling  Neuro:  alert, oriented to person, Hospital, Head and bilateral UE intentional tremor. Speech anomic aphasia, non fluent Decreased FMC, FTN generally d/t tremor. Sensed pain in all 4's. No resting tone, DTR's 1+ Musculoskeletal: no pain with limb, trunk rom    Assessment/Plan: 1. 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 Physiatrist is providing close team supervision and 24 hour management of active medical problems listed below. Physiatrist and rehab team continue to assess barriers to discharge/monitor patient progress toward functional and medical goals  Care Tool:  Bathing    Body parts bathed by patient: Right arm, Left arm, Abdomen, Front perineal area, Buttocks, Right upper leg, Left upper leg, Face   Body parts bathed by helper: Left lower leg, Right lower leg     Bathing assist Assist Level: Minimal Assistance - Patient > 75%     Upper Body Dressing/Undressing Upper body dressing   What is the patient wearing?: Pull over shirt    Upper body assist Assist Level: Minimal Assistance - Patient > 75%    Lower Body Dressing/Undressing Lower body dressing      What is the patient wearing?: Pants     Lower body assist Assist for lower body dressing: Maximal Assistance - Patient 25 - 49%     Toileting Toileting    Toileting assist Assist for toileting: Moderate Assistance - Patient 50 - 74%  Transfers Chair/bed transfer  Transfers assist     Chair/bed transfer assist level: Minimal Assistance - Patient > 75%     Locomotion Ambulation   Ambulation assist      Assist level: Moderate Assistance - Patient 50 - 74% Assistive device: Hand held assist Max distance: 25'   Walk 10 feet activity   Assist     Assist level: Moderate Assistance - Patient - 50 - 74% Assistive device: Hand held assist   Walk 50 feet activity   Assist Walk 50 feet with 2 turns activity did not occur: Safety/medical concerns         Walk 150 feet activity   Assist Walk 150 feet activity did not occur: Safety/medical concerns         Walk 10 feet on uneven surface  activity   Assist      Assist level: Moderate Assistance - Patient - 50 - 74% Assistive device:  (R hand rail)   Wheelchair     Assist Is the patient using a wheelchair?: No             Wheelchair 50 feet with 2 turns activity    Assist            Wheelchair 150 feet activity     Assist          Blood pressure (!) 139/59, pulse 89, temperature 97.8 F (36.6 C), temperature source Oral, resp. rate 16, height 5\' 6"  (1.676 m), weight 78.3 kg, SpO2 98 %.  Medical Problem List and Plan: 1. 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  Antithrombotics: -DVT/anticoagulation:   none given IPH             -antiplatelet therapy: none 3. Pain Management: Tylenol PRN 4. Anxiety: LCSW to evaluate and provide emotional support. Continue Xanax .0.25 mg q HS PRN sleep, anxiety             -antipsychotic agents: n/a 5. Neuropsych: This patient is not capable of making decisions on her own behalf. 6. Skin/Wound Care: Routine skin checks 7. Fluids/Electrolytes/Nutrition: po intake has been variable  -encouraged pt to improve intake  -add protein supplement 8. Hypertension:   --No home medications PTA Vitals:   06/08/21 1959 06/09/21 0500  BP: (!) 123/55 (!) 139/59  Pulse: 88 89  Resp: 14 16  Temp: (!) 97.5 F (36.4 C) 97.8 F (36.6 C)  SpO2: 97% 98%  Controlled 1/14, on propranolol for tremor, no bradycardia  9. Hyperlipidemia: continue Crestor 10: DM-2: carb modified diet; Hgb A1c = 5.5.  CBG (last 3)  No results for input(s): GLUCAP in the last 72 hours.  11: Trigeminal neuralgia: continue Lamictal, duloxetine and Lyrica.  --Followed by [facility]. --Previously tried to stop Lyrica, but had increase in nerve pain 12. Essential tremor: started 10mg  propanolol at  night.    -will need more than once per day to treat.   1/13 increase to bid to start    -monitor for tolerance  LOS: 2 days A FACE TO FACE EVALUATION WAS PERFORMED  Charlett Blake 06/09/2021, 9:23 AM

## 2021-06-10 DIAGNOSIS — I610 Nontraumatic intracerebral hemorrhage in hemisphere, subcortical: Secondary | ICD-10-CM | POA: Diagnosis not present

## 2021-06-10 MED ORDER — QUETIAPINE FUMARATE 25 MG PO TABS
25.0000 mg | ORAL_TABLET | Freq: Once | ORAL | Status: AC
  Administered 2021-06-10: 25 mg via ORAL
  Filled 2021-06-10: qty 1

## 2021-06-10 NOTE — Progress Notes (Signed)
PROGRESS NOTE   Subjective/Complaints:  Remains aphasic but can follow simple commands   ROS: Limited due to cognitive/behavioral    Objective:   No results found. Recent Labs    06/08/21 0519  WBC 6.7  HGB 12.4  HCT 37.8  PLT 203    Recent Labs    06/08/21 0519  NA 135  K 3.5  CL 100  CO2 25  GLUCOSE 95  BUN 13  CREATININE 0.73  CALCIUM 9.0     Intake/Output Summary (Last 24 hours) at 06/10/2021 8144 Last data filed at 06/09/2021 2100 Gross per 24 hour  Intake 360 ml  Output --  Net 360 ml         Physical Exam: Vital Signs Blood pressure 120/61, pulse 76, temperature 98 F (36.7 C), temperature source Oral, resp. rate 18, height 5\' 6"  (1.676 m), weight 78.3 kg, SpO2 95 %.   General: No acute distress Mood and affect are appropriate Heart: Regular rate and rhythm no rubs murmurs or extra sounds Lungs: Clear to auscultation, breathing unlabored, no rales or wheezes Abdomen: Positive bowel sounds, soft nontender to palpation, nondistended Extremities: No clubbing, cyanosis, or edema Skin: No evidence of breakdown, no evidence of rash Neurologic: Cranial nerves II through XII intact, motor strength is 5/5 in bilateral deltoid, bicep, tricep, grip, hip flexor, knee extensors, ankle dorsiflexor and plantar flexor Sensory exam normal sensation to light touch and proprioception in bilateral upper and lower extremities Cerebellar exam normal finger to nose to finger as well as heel to shin in bilateral upper and lower extremities Musculoskeletal: Full range of motion in all 4 extremities. No joint swelling  Neuro:  alert, oriented to person, Hospital, Head and bilateral UE intentional tremor. Speech anomic aphasia, non fluent Decreased FMC, FTN generally d/t tremor. Sensed pain in all 4's. No resting tone, DTR's 1+ Musculoskeletal: no pain with limb, trunk rom   Assessment/Plan: 1. Functional deficits  due to Left hemorrhagic stroke in BG which require 3+ hours per day of interdisciplinary therapy in a comprehensive inpatient rehab setting. Physiatrist is providing close team supervision and 24 hour management of active medical problems listed below. Physiatrist and rehab team continue to assess barriers to discharge/monitor patient progress toward functional and medical goals  Care Tool:  Bathing    Body parts bathed by patient: Right arm, Left arm, Abdomen, Front perineal area, Buttocks, Right upper leg, Left upper leg, Face   Body parts bathed by helper: Left lower leg, Right lower leg     Bathing assist Assist Level: Minimal Assistance - Patient > 75%     Upper Body Dressing/Undressing Upper body dressing   What is the patient wearing?: Pull over shirt    Upper body assist Assist Level: Minimal Assistance - Patient > 75%    Lower Body Dressing/Undressing Lower body dressing      What is the patient wearing?: Pants     Lower body assist Assist for lower body dressing: Maximal Assistance - Patient 25 - 49%     Toileting Toileting    Toileting assist Assist for toileting: Moderate Assistance - Patient 50 - 74%     Transfers Chair/bed transfer  Transfers assist     Chair/bed transfer assist level: Minimal Assistance - Patient > 75%     Locomotion Ambulation   Ambulation assist      Assist level: Moderate Assistance - Patient 50 - 74% Assistive device: Hand held assist Max distance: 25'   Walk 10 feet activity   Assist     Assist level: Moderate Assistance - Patient - 50 - 74% Assistive device: Hand held assist   Walk 50 feet activity   Assist Walk 50 feet with 2 turns activity did not occur: Safety/medical concerns         Walk 150 feet activity   Assist Walk 150 feet activity did not occur: Safety/medical concerns         Walk 10 feet on uneven surface  activity   Assist     Assist level: Moderate Assistance - Patient -  50 - 74% Assistive device:  (R hand rail)   Wheelchair     Assist Is the patient using a wheelchair?: No             Wheelchair 50 feet with 2 turns activity    Assist            Wheelchair 150 feet activity     Assist          Blood pressure 120/61, pulse 76, temperature 98 F (36.7 C), temperature source Oral, resp. rate 18, height 5\' 6"  (1.676 m), weight 78.3 kg, SpO2 95 %.  Medical Problem List and Plan: 1. 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  Antithrombotics: -DVT/anticoagulation:   none given IPH             -antiplatelet therapy: none 3. Pain Management: Tylenol PRN 4. Anxiety: LCSW to evaluate and provide emotional support. Continue Xanax .0.25 mg q HS PRN sleep, anxiety             -antipsychotic agents: n/a 5. Neuropsych: This patient is not capable of making decisions on her own behalf. 6. Skin/Wound Care: Routine skin checks 7. Fluids/Electrolytes/Nutrition: po intake has been variable  -encouraged pt to improve intake  -add protein supplement 8. Hypertension:   --No home medications PTA Vitals:   06/09/21 1928 06/10/21 0449  BP: (!) 113/57 120/61  Pulse: 77 76  Resp: 17 18  Temp: 98.4 F (36.9 C) 98 F (36.7 C)  SpO2: 96% 95%  Controlled 1/14, on propranolol for tremor, no bradycardia  9. Hyperlipidemia: continue Crestor 10: DM-2: carb modified diet; Hgb A1c = 5.5.  CBG (last 3)  No results for input(s): GLUCAP in the last 72 hours.  11: Trigeminal neuralgia: continue Lamictal, duloxetine and Lyrica.  --Followed by [facility]. --Previously tried to stop Lyrica, but had increase in nerve pain 12. Essential tremor: started 10mg  propanolol at night.    -will need more than once per day to treat.   1/13 increase  to bid to start    -monitor for tolerance  LOS: 3 days A FACE TO FACE EVALUATION WAS PERFORMED  Charlett Blake 06/10/2021, 9:09 AM

## 2021-06-10 NOTE — Progress Notes (Signed)
Speech Language Pathology Daily Session Note  Patient Details  Name: Shelby Hill MRN: [id number] Date of Birth: June 18, 1935  Today's Date: 06/10/2021 SLP Individual Time: 1030-1130 SLP Individual Time Calculation (min): 60 min  Short Term Goals: Week 1: SLP Short Term Goal 1 (Week 1): Patient will utilize multi-modal communication to demonstrate orientation to place, time and situation with Mod A multimodal cues. SLP Short Term Goal 2 (Week 1): Patient will answer direct, open-ended questions with only 1-2 word utterances with Mod A multimodal cues. SLP Short Term Goal 3 (Week 1): Patient will self-monitor and correct errors during written expression with Mod verbal and visual cues. SLP Short Term Goal 4 (Week 1): Patient will demosntrate sustained attention to functional tasks for 30 minutes with Min verbal cues for redirection.  Skilled Therapeutic Interventions:   Pt was seen for skilled ST services with focus on cognitive-linguistic goals. Pt was able to recall month, but was unable to recall any other details surrounding orientation. SLP directed pt attention to external aids for increased recall of orientation information [employer] [facility] [employer], Rehab, Stroke). SLP also provided pt with calendar. Pt was able to find the year given minA directional cues (I.e. top of calendar). Facilitated sustained attention during a card playing task (after which pt reported she does NOT enjoy playing cards, so may not be a target in the future). Practiced using internal memory strategy (repetition) to recall 3 rules (color, shape, number). Pt benefited from orthographic cue to reason through for each card played. Overall, she required mod-maxA verbal and gestural cueing. Pt's daughter-in-law was in room for therapy session. She reports pt is less confused than yesterday. Pt was left in room with daughter-in-law, bed alarm activated, and all immediate needs within reach.   Pain Pain Assessment Pain Score: 0-No  pain  Therapy/Group: Individual Therapy  Verdene Lennert MS, CCC-SLP, CBIS  06/10/2021, 12:10 PM

## 2021-06-10 NOTE — Progress Notes (Signed)
Pts confusion is increasing, at first she was easily redirected, but now she is continuously lifting chair alarm over her head and attempting to stand up. Pts husband is at the bedside. Called Dr. Letta Pate for PRN order, see MAR.

## 2021-06-11 DIAGNOSIS — E440 Moderate protein-calorie malnutrition: Secondary | ICD-10-CM | POA: Diagnosis not present

## 2021-06-11 DIAGNOSIS — I610 Nontraumatic intracerebral hemorrhage in hemisphere, subcortical: Secondary | ICD-10-CM | POA: Diagnosis not present

## 2021-06-11 DIAGNOSIS — G250 Essential tremor: Secondary | ICD-10-CM | POA: Diagnosis not present

## 2021-06-11 DIAGNOSIS — I10 Essential (primary) hypertension: Secondary | ICD-10-CM | POA: Diagnosis not present

## 2021-06-11 MED ORDER — LORAZEPAM 2 MG/ML IJ SOLN
0.5000 mg | Freq: Once | INTRAMUSCULAR | Status: AC
  Administered 2021-06-11: 0.5 mg via INTRAMUSCULAR
  Filled 2021-06-11: qty 1

## 2021-06-11 MED ORDER — QUETIAPINE FUMARATE 25 MG PO TABS: 25.0000 mg | ORAL_TABLET | Freq: Once | ORAL | Status: DC

## 2021-06-11 MED ORDER — BLOOD PRESSURE CONTROL BOOK
Freq: Once | Status: AC
  Filled 2021-06-11: qty 1

## 2021-06-11 MED ORDER — LORAZEPAM 0.5 MG PO TABS: 0.5000 mg | ORAL_TABLET | Freq: Once | ORAL | Status: AC

## 2021-06-11 NOTE — Progress Notes (Signed)
Occupational Therapy Session Note  Patient Details  Name: Shelby Hill MRN: [id number] Date of Birth: 12/05/1935  Today's Date: 06/11/2021 OT Individual Time: 1050-1200 OT Individual Time Calculation (min): 70 min    Short Term Goals: Week 1:  OT Short Term Goal 1 (Week 1): Patient will complete LB dressing with min A OT Short Term Goal 2 (Week 1): Patient will maintain standing balance at the sink with CGA in preparation for BADL task OT Short Term Goal 3 (Week 1): Patient will orient shirt with min questioning cues.  Skilled Therapeutic Interventions/Progress Updates:   Pt greeted standing up at the edge of the bed unassisted, stating she was trying to make her bed. OT educated on falls risk and need for assistance to get OOB. Pt much more confused today and needed multimodal cues for redirection as she was perseverating on fixing the bed. OT noted an odor of BM. Pt needed max encouragement to go to the bathroom and becoming slightly agitated with OT's request to go to the bathroom. CGA for ambulation and min A for clothing management. Pt had had incontinent BM, but unable to attend long enough to try to have more BM in commode. OT able to get patient to shower as she stated she had already showered today (which she had not.) Pt needed max cues to move on to next body part. Pt unable to follow commands to wash buttocks and peri-area in standing, requiring OT assist to complete task. Pt completed stand-pivot to wc with CGA. Pt frequently trying to stand with wc breaks unlocked with very poor safety awareness. OT requested Telesitter and nursing ordered. Dressing completed sit<>stand from wc at the sink with min A for UB and mod A for LB with multimodal cues to orient clothing. Cognitive task using BITS and 3 word/image combination working on memory and attention. Pt with increased difficulty as environment became more distracted by other patientes. Pt unable to recall even 2 words at this time in  distracting environment. OT returned pt to room and husband present. OT educated on purpose of Telesitting, alarm belt, high fall risk, and falls precautions.    Therapy Documentation Precautions:  Precautions Precautions: Fall (Simultaneous filing. User may not have seen previous data.) Precaution Comments: R knee problems recently Restrictions Weight Bearing Restrictions: No Pain: Pain Assessment Pain Scale: 0-10 Pain Score: 0-No pain  Therapy/Group: Individual Therapy  Valma Cava 06/11/2021, 12:39 PM

## 2021-06-11 NOTE — Progress Notes (Signed)
Speech Language Pathology Daily Session Note  Patient Details  Name: Shelby Hill MRN: [id number] Date of Birth: Nov 26, 1935  Today's Date: 06/11/2021 SLP Individual Time: 1400-1500 SLP Individual Time Calculation (min): 60 min  Short Term Goals: Week 1: SLP Short Term Goal 1 (Week 1): Patient will utilize multi-modal communication to demonstrate orientation to place, time and situation with Mod A multimodal cues. SLP Short Term Goal 2 (Week 1): Patient will answer direct, open-ended questions with only 1-2 word utterances with Mod A multimodal cues. SLP Short Term Goal 3 (Week 1): Patient will self-monitor and correct errors during written expression with Mod verbal and visual cues. SLP Short Term Goal 4 (Week 1): Patient will demosntrate sustained attention to functional tasks for 30 minutes with Min verbal cues for redirection.  Skilled Therapeutic Interventions: Skilled treatment session focused on cognitive-linguistic goals. Patient demonstrated 100% accuracy for reading comprehension at the word level.  Patient also demonstrated 100% accuracy during a phrase completion task while reading aloud with Min phonemic cues. Patient also participated in a naming task in which she required Mod verbal cues for word-finding with intermittent awareness of difficulty, "I'm not thinking right." Patient also required Min-Mod A verbal cues to verbally compare and contrast between 2 functional items. Patient also asked mildly abstract questions in which she could stay on task for ~2 turns before becoming tangential with jargon. Patient's husband and daughter present at end of session and educated regarding progress. Patient left upright in wheelchair with alarm on and family present. Continue with current plan of care.      Pain No/Denies Pain   Therapy/Group: Individual Therapy  Ronella Plunk 06/11/2021, 3:10 PM

## 2021-06-11 NOTE — Progress Notes (Signed)
PROGRESS NOTE   Subjective/Complaints:  ?increased confusion over weekend. Pt expressed to me that she felt she didn't get enough therapy.   ROS: limited due to language/communication    Objective:   No results found. No results for input(s): WBC, HGB, HCT, PLT in the last 72 hours. No results for input(s): NA, K, CL, CO2, GLUCOSE, BUN, CREATININE, CALCIUM in the last 72 hours.  Intake/Output Summary (Last 24 hours) at 06/11/2021 1014 Last data filed at 06/11/2021 0741 Gross per 24 hour  Intake 657 ml  Output --  Net 657 ml        Physical Exam: Vital Signs Blood pressure 120/71, pulse 70, temperature 98.3 F (36.8 C), temperature source Oral, resp. rate 16, height 5\' 6"  (1.676 m), weight 104.2 kg, SpO2 94 %.   Constitutional: No distress . Vital signs reviewed. HEENT: NCAT, EOMI, oral membranes moist Neck: supple Cardiovascular: RRR without murmur. No JVD    Respiratory/Chest: CTA Bilaterally without wheezes or rales. Normal effort    GI/Abdomen: BS +, non-tender, non-distended Ext: no clubbing, cyanosis, or edema Psych: pleasant and cooperative  Skin: No evidence of breakdown, no evidence of rash Neuro:  alert, oriented to person, Hospital, Head and bilateral UE intentional tremor. Demonstrates anomic aphasia, non fluent Decreased FMC, FTN generally d/t tremor. Sensed pain in all 4's. No resting tone, DTR's 1+ Musculoskeletal: no pain with limb, trunk rom   Assessment/Plan: 1. 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 Physiatrist is providing close team supervision and 24 hour management of active medical problems listed below. Physiatrist and rehab team continue to assess barriers to discharge/monitor patient progress toward functional and medical goals  Care Tool:  Bathing    Body parts  bathed by patient: Right arm, Left arm, Abdomen, Front perineal area, Buttocks, Right upper leg, Left upper leg, Face   Body parts bathed by helper: Left lower leg, Right lower leg     Bathing assist Assist Level: Minimal Assistance - Patient > 75%     Upper Body Dressing/Undressing Upper body dressing   What is the patient wearing?: Pull over shirt    Upper body assist Assist Level: Minimal Assistance - Patient > 75%    Lower Body Dressing/Undressing Lower body dressing      What is the patient wearing?: Pants     Lower body assist Assist for lower body dressing: Maximal Assistance - Patient 25 - 49%     Toileting Toileting    Toileting assist Assist for toileting: Moderate Assistance - Patient 50 - 74%     Transfers Chair/bed transfer  Transfers assist     Chair/bed transfer assist level: Minimal Assistance - Patient > 75%     Locomotion Ambulation   Ambulation assist      Assist level: Moderate Assistance - Patient 50 - 74% Assistive device: Hand held assist Max distance: 25'   Walk 10 feet activity   Assist     Assist level: Moderate Assistance - Patient - 50 - 74% Assistive device: Hand held assist   Walk 50 feet activity   Assist  Walk 50 feet with 2 turns activity did not occur: Safety/medical concerns         Walk 150 feet activity   Assist Walk 150 feet activity did not occur: Safety/medical concerns         Walk 10 feet on uneven surface  activity   Assist     Assist level: Moderate Assistance - Patient - 50 - 74% Assistive device:  (R hand rail)   Wheelchair     Assist Is the patient using a wheelchair?: No             Wheelchair 50 feet with 2 turns activity    Assist            Wheelchair 150 feet activity     Assist          Blood pressure 120/71, pulse 70, temperature 98.3 F (36.8 C), temperature source Oral, resp. rate 16, height 5\' 6"  (1.676 m), weight 104.2 kg, SpO2 94  %.  Medical Problem List and Plan: 1. 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  Antithrombotics: -DVT/anticoagulation:   none given IPH             -antiplatelet therapy: none 3. Pain Management: Tylenol PRN 4. Anxiety: LCSW to evaluate and provide emotional support. Continue Xanax .0.25 mg q HS PRN sleep, anxiety             -antipsychotic agents: n/a 5. Neuropsych: This patient is not capable of making decisions on her own behalf. 6. Skin/Wound Care: Routine skin checks 7. Fluids/Electrolytes/Nutrition: po intake has been variable  -encouraged pt to improve intake  -added protein supplement 8. Hypertension:   --No home medications PTA Vitals:   06/10/21 2154 06/11/21 0500  BP: 120/64 120/71  Pulse: 77 70  Resp: 14 16  Temp: 98.3 F (36.8 C) 98.3 F (36.8 C)  SpO2: 95% 94%  Controlled 1/16.Marland Kitchen on propranolol for tremor, no bradycardia  9. Hyperlipidemia: continue Crestor 10: DM-2: carb modified diet; Hgb A1c = 5.5.  CBG (last 3)  No results for input(s): GLUCAP in the last 72 hours.  11: Trigeminal neuralgia: continue Lamictal, duloxetine and Lyrica.  --Followed by [facility]. --seems controlled 12. Essential tremor: on bid propranolol with some improvement  -monitor with therapy LOS: 4 days A FACE TO FACE EVALUATION WAS PERFORMED  Meredith Staggers 06/11/2021, 10:14 AM

## 2021-06-11 NOTE — Progress Notes (Signed)
Physical Therapy Session Note  Patient Details  Name: NOYA SANTARELLI MRN: [id number] Date of Birth: 1935-12-03  Today's Date: 06/11/2021 PT Individual Time: 0900-1015 PT Individual Time Calculation (min): 75 min   Short Term Goals: Week 1:  PT Short Term Goal 1 (Week 1): Pt will perform bed mobility with CGA. PT Short Term Goal 2 (Week 1): Pt will perform sit to stand consistently with CGA. PT Short Term Goal 3 (Week 1): Pt will perform bed to chair consistently with CGA. PT Short Term Goal 4 (Week 1): Pt will ambulate x150' with CGA and LRAD. Week 2:    Week 3:     Skilled Therapeutic Interventions/Progress Updates:    PAIN denies pain Pt initially supine.  Appears confused states she is going home tomorrow w/broken phrases/aphasic.  Frustrated stating that she has been in bed all weekend, unable to recall therapy sessions.   Lifts legs for therapist to thread pants.  Supine to sit w/mod assist without bedrails. Static sit w/supervision, scoots to edge w/cues. Sit to stand w/cga to RW, raises pants w/cues. Gait x 26ft to sink w/rw w/cga. Standing at sink w/singk support pt able to brush teeth w/set up, hair w/cues and set up, cga. Gt 10 ft to wc w/cga w/RW.  Pt does attempt to stand from chair when therapist turns away.  Pt transported to gym. Gait  146ft w/RW, cga,  ER of LLE which appears premorbid, decreased stance time R, mild L lean, frequently distracted by environmental distractions.  Standing balance - pt able to reach L and R to obtain clothespins and reach overhead to clip to basketball net w/cga initially from standard ht table then reaching down to stool.    Attempted alternating step forward/back wihtout AD but pt unable to complete/clear feet and takes series of small steps.  Pt able to complete 10x each using RW for support w/cga.  Demo/cues for initially planning . Repeated via alternating lateral steps w/cga.  Functional gait:  61ft weaving thru cones w/RW w/cga,  cues for navigation, occasional standing rest d/t L knee pain? - pt w/difficulty explaining reason for pauses.    Pt tends to converse nonsensically during session.  Gait 125ft w/RW to room w/cga, cues for navigation, cues to attend to L environment/collides w/desk on L.   Pt left oob in wc w/alarm belt set and needs in reach.  NT notified to closely monitor pt due to demonstrated impulsivity. Therapy Documentation Precautions:  Precautions Precautions: Fall (Simultaneous filing. User may not have seen previous data.) Precaution Comments: R knee problems recently Restrictions Weight Bearing Restrictions: No   Other Treatments:      Therapy/Group: Individual Therapy Callie Fielding, PT   Jerrilyn Cairo 06/11/2021, 12:00 PM

## 2021-06-11 NOTE — Progress Notes (Signed)
Patient's husband was called to inform him that the patient had become aggressive & that an order for restraints was placed. He verbalized understanding.

## 2021-06-11 NOTE — Progress Notes (Signed)
Met with the patient, spouse and daughter to review rehab process, concerns. Reviewed medications, treatment for secondary risks including HTN, HLD (LDL 56), DM (A1C 5.5) and reviewed medications ordered for trigeminal neuralgia and essential tremors. Reviewed CMM diet and recommendations for increased protein foods. Assisted daughter to gain access to the My chart app as they had been missing the MD rounds and had questions about therapy progress. Continue to follow along to discharge to address educational needs and facilitate preparation for discharge. Margarito Liner

## 2021-06-12 DIAGNOSIS — G250 Essential tremor: Secondary | ICD-10-CM | POA: Diagnosis not present

## 2021-06-12 DIAGNOSIS — E440 Moderate protein-calorie malnutrition: Secondary | ICD-10-CM | POA: Diagnosis not present

## 2021-06-12 DIAGNOSIS — I10 Essential (primary) hypertension: Secondary | ICD-10-CM | POA: Diagnosis not present

## 2021-06-12 DIAGNOSIS — I610 Nontraumatic intracerebral hemorrhage in hemisphere, subcortical: Secondary | ICD-10-CM | POA: Diagnosis not present

## 2021-06-12 LAB — URINALYSIS, COMPLETE (UACMP) WITH MICROSCOPIC
Bilirubin Urine: NEGATIVE
Glucose, UA: NEGATIVE mg/dL
Hgb urine dipstick: NEGATIVE
Ketones, ur: 15 mg/dL — AB
Leukocytes,Ua: NEGATIVE
Nitrite: NEGATIVE
Protein, ur: NEGATIVE mg/dL
Specific Gravity, Urine: 1.02 (ref 1.005–1.030)
pH: 6.5 (ref 5.0–8.0)

## 2021-06-12 MED ORDER — QUETIAPINE FUMARATE 25 MG PO TABS
12.5000 mg | ORAL_TABLET | Freq: Two times a day (BID) | ORAL | Status: DC
  Administered 2021-06-12 – 2021-06-16 (×9): 12.5 mg via ORAL
  Filled 2021-06-12 (×9): qty 1

## 2021-06-12 MED ORDER — QUETIAPINE FUMARATE 25 MG PO TABS: 25.0000 mg | ORAL_TABLET | Freq: Every evening | ORAL | Status: DC | PRN

## 2021-06-12 NOTE — Patient Care Conference (Signed)
Inpatient RehabilitationTeam Conference and Plan of Care Update Date: 06/12/2021   Time: 10:37 AM    Patient Name: Shelby Hill [facility]      Medical Record Number: [id number]  Date of Birth: [birth year] Sex: Female         Room/Bed: 4M12C/4M12C-01 Payor Info: Payor: [employer] MEDICARE / Plan: [employer] MEDICARE CHOICE PPO / Product Type: *No Product type* /    Admit Date/Time:  06/07/2021  4:55 PM  Primary Diagnosis:  ICH (intracerebral hemorrhage) [facility])  Hospital Problems: Principal Problem:   ICH (intracerebral hemorrhage) [facility])    Expected Discharge Date: Expected Discharge Date: 06/22/21  Team Members Present: Physician leading conference: Dr. Alger Simons Social Worker Present: Loralee Pacas, [place] Nurse Present: Dorien Chihuahua, RN PT Present: Tereasa Coop, PT OT Present: Cherylynn Ridges, OT SLP Present: Weston Anna, SLP PPS Coordinator present : Gunnar Fusi, SLP     Current Status/Progress Goal Weekly Team Focus  Bowel/Bladder   cont of b/b. LBM 1/16  remain cont  assess q shift and prn   Swallow/Nutrition/ Hydration             ADL's   Min A BADL tasks overall, very poor safety awareness, confusion, new increased agitation  Supervision  cognitie retraining, self-care retraining, balance, general strengthening   Mobility   CGA overall, ambulating >150' with RW  Supervision  Cognition, balance, safety awareness, ambulation, multi-step commands   Communication   Mod A  Min A  word-finding, awareness of errors   Safety/Cognition/ Behavioral Observations  Max A  Min A  sustianed attention, orientation, basic awareness, behavior management   Pain   no complaints of pain  remain pain free  assess q shift and prn   Skin   intact  remain intact  assess q shift and prn     Discharge Planning:  Pt to d/c tohome with her husband who will provide 24/7 care. He has limited physivcal abilities and  uses a cane; also memory deficits due to old CVA. Patient will have PRN support  from children and neighbors.   Team Discussion: Patient with left basal ganglia infarct/IPH with right side weakness. Note language of confusion, aphasia and cognitive deficits. Agitation noted last p.m. requiring sedation and restraints. Today patient was drowsy and presented poor safety awareness, and was not able to follow 2 step commands as she was last week. Patient is able to name objects and read items but struggles with writing, and holding onto information, poor recall. Essential tremors responded well to medication.  Patient on target to meet rehab goals: yes, currently needs min assist for most tasks and multi modal cues.  Able to ambulate up to 150' with CGA.   *See Care Plan and progress notes for long and short-term goals.   Revisions to Treatment Plan:  MD to adjust medications   Teaching Needs: Safety, medication management, secondary risk management, dietary modification recommendations, transfers, toileting, etc.   Current Barriers to Discharge: Decreased caregiver support; husband has physical limitations post CVA; ambulates with a cane and children routinely check in but are not available 24/7.  Possible Resolutions to Barriers: Family education     Medical Summary Current Status: left basal ganglia IPH with associated right hemiparesis and aphasia, cognitive dysfunction. intentional tremor  Barriers to Discharge: Medical stability   Possible Resolutions to [employer] Focus: daily assessment of labs and pt data, re-establish consistent sleep-wake cycle, decrease tremor   Continued Need for Acute Rehabilitation Level of Care: The patient requires daily medical  management by a physician with specialized training in physical medicine and rehabilitation for the following reasons: Direction of a multidisciplinary physical rehabilitation program to maximize functional independence : Yes Medical management of patient stability for increased activity during participation  in an intensive rehabilitation regime.: Yes Analysis of laboratory values and/or radiology reports with any subsequent need for medication adjustment and/or medical intervention. : Yes   I attest that I was present, lead the team conference, and concur with the assessment and plan of the team.   Dorien Chihuahua B 06/12/2021, 1:59 PM

## 2021-06-12 NOTE — Progress Notes (Signed)
Patient ID: Shelby Hill, female   DOB: 08/05/1935, 85 y.o.   MRN: [id number] ° °SW met with pt, pt husband, and pt dtr Shelby Hill in room to provide updates from team conference, and d/c date 1/27. SW confirms primary support will be pt husband, and children will check in PRN as they all work. SW discussed scheduling family education prior to discharge.  ° °*SW provided HHA and home measurement form.  ° ° , MSW, LCSWA °Office: [phone] °Cell: [phone] °Fax: [phone]  °

## 2021-06-12 NOTE — Progress Notes (Signed)
Occupational Therapy Session Note  Patient Details  Name: Shelby Hill MRN: [id number] Date of Birth: 1936-01-02  Today's Date: 06/12/2021 OT Individual Time: 7919-9579 OT Individual Time Calculation (min): 41 min   Short Term Goals: Week 1:  OT Short Term Goal 1 (Week 1): Patient will complete LB dressing with min A OT Short Term Goal 2 (Week 1): Patient will maintain standing balance at the sink with CGA in preparation for BADL task OT Short Term Goal 3 (Week 1): Patient will orient shirt with min questioning cues.  Skilled Therapeutic Interventions/Progress Updates:    Pt greeted handoff from earlier OT session and agreeable to next OT treatment session. Addressed cognition and fine motor coordination with graded peg board puzzle. Started with simple 2 color pattern. OT had to grade down task to remove all other colors, then pt needed max multimodal cues initially, progressing to moderate cues. Pt with difficulty following two step commands and often getting confused on which end to place the pegs. Pt with increased difficulty with more distractions in room from other patients. Functional ambulation in gym with min HHA while locating objects in the room with moderate multimodal cues. Pt returned to room with spouse present. Pt left seated in wc with alarm belt on, call bell in reach, and needs met.   Therapy Documentation Precautions:  Precautions Precautions: Fall (Simultaneous filing. User may not have seen previous data.) Precaution Comments: R knee problems recently Restrictions Weight Bearing Restrictions: No Pain: Pain Assessment Pain Scale: 0-10 Pain Score: 0-No pain   Therapy/Group: Individual Therapy  Valma Cava 06/12/2021, 11:01 AM

## 2021-06-12 NOTE — Progress Notes (Signed)
Speech Language Pathology Daily Session Note  Patient Details  Name: Shelby Hill MRN: [id number] Date of Birth: 1935-06-18  Today's Date: 06/12/2021 SLP Individual Time: 1300-1400 SLP Individual Time Calculation (min): 60 min  Short Term Goals: Week 1: SLP Short Term Goal 1 (Week 1): Patient will utilize multi-modal communication to demonstrate orientation to place, time and situation with Mod A multimodal cues. SLP Short Term Goal 2 (Week 1): Patient will answer direct, open-ended questions with only 1-2 word utterances with Mod A multimodal cues. SLP Short Term Goal 3 (Week 1): Patient will self-monitor and correct errors during written expression with Mod verbal and visual cues. SLP Short Term Goal 4 (Week 1): Patient will demosntrate sustained attention to functional tasks for 30 minutes with Min verbal cues for redirection.  Skilled Therapeutic Interventions: Skilled treatment session focused on cognitive-linguistic goals. SLP facilitated session by providing total A for problem solving during a 4-step picture sequencing task. Patient demonstrated increased difficulty with topic maintenance and demonstrated fluent speech with jargon. Patient appeared to perseverate on specific topics and people with total A needed for redirection with minimal awareness. 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 Patient appeared more calm and cooperative today. Patient returned to room where she was continent of bladder (placed on commode to gain a urine sample despite patient reporting she did not need to void). Patient left upright in wheelchair with alarm on and all needs within reach. Continue with current plan of care.        Pain No/Denies Pain   Therapy/Group: Individual Therapy  Debe Anfinson 06/12/2021, 2:54 PM

## 2021-06-12 NOTE — Progress Notes (Signed)
PROGRESS NOTE   Subjective/Complaints:  Pt very confused last night per nurse after family left. Pt became aggressive and refused meds. She was given im ativan, placed in soft restraints. Pt eventually settled down. Is calm and alert this morning. Still with significant cognitive deficits  ROS: Limited due to cognitive/behavioral    Objective:   No results found. No results for input(s): WBC, HGB, HCT, PLT in the last 72 hours. No results for input(s): NA, K, CL, CO2, GLUCOSE, BUN, CREATININE, CALCIUM in the last 72 hours.  Intake/Output Summary (Last 24 hours) at 06/12/2021 1138 Last data filed at 06/12/2021 0845 Gross per 24 hour  Intake 420 ml  Output --  Net 420 ml        Physical Exam: Vital Signs Blood pressure 134/84, pulse 88, temperature 98.5 F (36.9 C), temperature source Oral, resp. rate 16, height 5\' 6"  (1.676 m), weight 104.2 kg, SpO2 99 %.   Constitutional: No distress . Vital signs reviewed. HEENT: NCAT, EOMI, oral membranes moist Neck: supple Cardiovascular: RRR without murmur. No JVD    Respiratory/Chest: CTA Bilaterally without wheezes or rales. Normal effort    GI/Abdomen: BS +, non-tender, non-distended Ext: no clubbing, cyanosis, or edema Psych: pleasant and cooperative  Skin: No evidence of breakdown, no evidence of rash Neuro:  alert, oriented to person. Follows basic commands. Word finding deficits at times.tremor still present but improved.  Decreased FMC, FTN generally d/t tremor. Sensed pain in all 4's. No resting tone, DTR's 1+ Musculoskeletal: no pain with limb, trunk rom   Assessment/Plan: 1. 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 Physiatrist is providing close team supervision and 24 hour management of active medical problems listed below. Physiatrist and rehab  team continue to assess barriers to discharge/monitor patient progress toward functional and medical goals  Care Tool:  Bathing    Body parts bathed by patient: Right arm, Left arm, Chest, Abdomen, Front perineal area, Right upper leg, Left upper leg, Face   Body parts bathed by helper: Buttocks, Right lower leg, Left lower leg     Bathing assist Assist Level: Minimal Assistance - Patient > 75%     Upper Body Dressing/Undressing Upper body dressing   What is the patient wearing?: Pull over shirt    Upper body assist Assist Level: Minimal Assistance - Patient > 75%    Lower Body Dressing/Undressing Lower body dressing      What is the patient wearing?: Pants     Lower body assist Assist for lower body dressing: Maximal Assistance - Patient 25 - 49%     Toileting Toileting    Toileting assist Assist for toileting: Moderate Assistance - Patient 50 - 74%     Transfers Chair/bed transfer  Transfers assist     Chair/bed transfer assist level: Minimal Assistance - Patient > 75%     Locomotion Ambulation   Ambulation assist      Assist level: Moderate Assistance - Patient 50 - 74% Assistive device: Hand held assist Max distance: 25'   Walk 10 feet activity   Assist  Assist level: Moderate Assistance - Patient - 50 - 74% Assistive device: Hand held assist   Walk 50 feet activity   Assist Walk 50 feet with 2 turns activity did not occur: Safety/medical concerns         Walk 150 feet activity   Assist Walk 150 feet activity did not occur: Safety/medical concerns         Walk 10 feet on uneven surface  activity   Assist     Assist level: Moderate Assistance - Patient - 50 - 74% Assistive device:  (R hand rail)   Wheelchair     Assist Is the patient using a wheelchair?: No             Wheelchair 50 feet with 2 turns activity    Assist            Wheelchair 150 feet activity     Assist          Blood  pressure 134/84, pulse 88, temperature 98.5 F (36.9 C), temperature source Oral, resp. rate 16, height 5\' 6"  (1.676 m), weight 104.2 kg, SpO2 99 %.  Medical Problem List and Plan: 1. 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 Interdisciplinary team conference today to discuss goals, barriers to discharge, and dc planning.   2.  Antithrombotics: -DVT/anticoagulation:   none given IPH             -antiplatelet therapy: none 3. Pain Management: Tylenol PRN 4. Anxiety: L Xanax .0.25 mg q HS PRN sleep, anxiety             -antipsychotic agents: seroquel  1/17-I see evidence of sundowning, especially when alone. Had a tough night last night. I spoke with husband who's seen it also. Sx typically start around 4pm -will try 12.5mg  seroquel at 1600 and 2100 daily  -also check ua, ucx -husband agrees with plan 5. Neuropsych: This patient is not capable of making decisions on her own behalf. 6. Skin/Wound Care: Routine skin checks 7. Fluids/Electrolytes/Nutrition: po intake has been variable  -encouraged pt to improve intake  -continue protein supplement 8. Hypertension:   --No home medications PTA Vitals:   06/11/21 2025 06/12/21 0513  BP: 124/74 134/84  Pulse: 98 88  Resp: 18 16  Temp:  98.5 F (36.9 C)  SpO2: 96% 99%  Controlled 1/17 on propranolol for tremor, no bradycardia  9. Hyperlipidemia: continue Crestor 10: DM-2: carb modified diet; Hgb A1c = 5.5.  CBG (last 3)  No results for input(s): GLUCAP in the last 72 hours.  11: Trigeminal neuralgia: continue Lamictal, duloxetine and Lyrica.  --Followed by [facility]. --seems controlled. Has been on these meds chronicaly 12. Essential tremor: on bid propranolol with some improvement  -seems improved   LOS: 5 days A FACE TO FACE EVALUATION  WAS PERFORMED  Meredith Staggers 06/12/2021, 11:38 AM

## 2021-06-12 NOTE — Progress Notes (Signed)
Patient began increasingly confused  after family left around 22 on 1/15. This nurse and an NT began trying to reorient patient and prevent her from getting out of bed. Patient became aggressive and began grabbing and kicking staff while trying to get out of bed. Patient refused to take any PO medications. On call PA was call and  IM ativan was ordered and administered to patient. Patient remained verbally and physically abusive towards staff. Patient threw water on staff and demanded to go to restroom. Staff attempted to escort patient to the restroom. Patient then grabbed staff by the shirt and refused to move past the door and demanded we let her out. Patient began to feel the affects of the ativan and was placed in a chair. Patient continued to hit and grab staff while trying to get up and refused to return to bed. On call PA was notified and restraints were ordered.

## 2021-06-12 NOTE — Care Management (Signed)
[facility] Individual Statement of Services  Patient Name:  Shelby Hill  Date:  06/12/2021  Welcome to the [facility].  Our goal is to provide you with an individualized program based on your diagnosis and situation, designed to meet your specific needs.  With this comprehensive rehabilitation program, you will be expected to participate in at least 3 hours of rehabilitation therapies Monday-Friday, with modified therapy programming on the weekends.  Your rehabilitation program will include the following services:  Physical Therapy (PT), Occupational Therapy (OT), Speech Therapy (ST), 24 hour per day rehabilitation nursing, Therapeutic Recreaction (TR), Psychology, Neuropsychology, Care Coordinator, Rehabilitation Medicine, [facility], and Other  Weekly team conferences will be held on Tuesdays to discuss your progress.  Your Inpatient Rehabilitation Care Coordinator will talk with you frequently to get your input and to update you on team discussions.  Team conferences with you and your family in attendance may also be held.  Expected length of stay: 12-14 days  Overall anticipated outcome: Supervision  Depending on your progress and recovery, your program may change. Your Inpatient Rehabilitation Care Coordinator will coordinate services and will keep you informed of any changes. Your Inpatient Rehabilitation Care Coordinator's name and contact numbers are listed  below.  The following services may also be recommended but are not provided by the [facility] will be made to provide these services after discharge if needed.  Arrangements include referral to agencies that provide these services.  Your insurance has been verified to be:  [employer]  Your primary  doctor is:  Jerene Bears  Pertinent information will be shared with your doctor and your insurance company.  Inpatient Rehabilitation Care Coordinator:  Cathleen Corti [phone] or ([phone]  Information discussed with and copy given to patient by: Rana Snare, 06/12/2021, 10:45 AM

## 2021-06-12 NOTE — Progress Notes (Signed)
Urine sent to lab at this time for ordered UA/ C&S.

## 2021-06-12 NOTE — Progress Notes (Signed)
Physical Therapy Session Note  Patient Details  Name: Shelby Hill MRN: [id number] Date of Birth: [birth year]  Today's Date: 06/12/2021 PT Individual Time: 9675-9163 PT Individual Time Calculation (min): 54 min   Short Term Goals: Week 1:  PT Short Term Goal 1 (Week 1): Pt will perform bed mobility with CGA. PT Short Term Goal 2 (Week 1): Pt will perform sit to stand consistently with CGA. PT Short Term Goal 3 (Week 1): Pt will perform bed to chair consistently with CGA. PT Short Term Goal 4 (Week 1): Pt will ambulate x150' with CGA and LRAD.  Skilled Therapeutic Interventions/Progress Updates:     Pt received seated in [facility] and agrees to therapy. No complaint of pain. WC transport to gym for time management. Pt performs sit to stand with CGA and cues for hand placement and body mechanics. Pt ambulates 175' slowly with RW, requiring minA primarily due to listing to the R and running into the wall. PT provides verbal cues to correct path but pt unable to do so substantially, so requires manual assistance. Pt then performs Nustep for 11:00 at workload of 4 for strength and endurance training. Pt able to maintain average steps per minute ~30. Pt noted to be reading Borg RPE scale out loud during exercise, but generally has non linear speech otherwise. Following, pt ambulates additional 51' with RW and minA, with same cues and tendency for listing to the R. Pt becomes distracted extremely easily and requires multimodal cues for redirection to task. WC transport back to room. Pt perform stand step transfer back to bed with minA and RW, with cues for initiation, sequencing, and positioning. Sit to supine with minA and multimodal cueing for body mechanics. Left with RN present.   Therapy Documentation Precautions:  Precautions Precautions: Fall (Simultaneous filing. User may not have seen previous data.) Precaution Comments: R knee problems recently Restrictions Weight Bearing Restrictions:  No   Therapy/Group: Individual Therapy  Breck Coons, PT, DPT 06/12/2021, 4:01 PM

## 2021-06-12 NOTE — Progress Notes (Signed)
Occupational Therapy Session Note  Patient Details  Name: Shelby Hill MRN: [id number] Date of Birth: [birth year]  Today's Date: 06/12/2021 OT Individual Time: 1740-8144 OT Individual Time Calculation (min): 45 min    Short Term Goals: Week 1:  OT Short Term Goal 1 (Week 1): Patient will complete LB dressing with min A OT Short Term Goal 2 (Week 1): Patient will maintain standing balance at the sink with CGA in preparation for BADL task OT Short Term Goal 3 (Week 1): Patient will orient shirt with min questioning cues.   Skilled Therapeutic Interventions/Progress Updates:    1:1 Pt in bed with wrist restraints donned; getting ready to take her morning meds with nursing. Doffed restraints and pt came to EOB with min A with extra time. Tremors not noted in static assessment but was still present in functional tasks in right hand. Pt does continue to present with slight inattention to right side. Pt ambulated with RW into the bathroom with min A and A to steer RW with turns. Pt with soiled brief but was able to void on toilet. Mod A for toileting task with contact guard. Pt wiped and then handed therapist the tissue. Pt needed A to doff sock and doffed shirt and transitioned into the shower with HHA and sat on seat. Pt required direct cues to sequence washing all body parts; and requiring A for washing buttocks and lower legs. Pt was able to dry with contact assist and transition into w/c in bathroom. Taken to the sink and dressed and dried hair (with assistance. Pt able to don shirt and pants with min A. Required A to thread socks and then could continue- due to decr coordination and inattention to right side. Pt handed off to next therapist. Pt pleasant the entire session but confusion still present about dx and situation.     Therapy Documentation Precautions:  Precautions Precautions: Fall (Simultaneous filing. User may not have seen previous data.) Precaution Comments: R knee problems  recently Restrictions Weight Bearing Restrictions: No  Pain: Pain Assessment Pain Scale: 0-10 Pain Score: 0-No pain   Therapy/Group: Individual Therapy  [facility] 06/12/2021, 10:16 AM

## 2021-06-13 DIAGNOSIS — E440 Moderate protein-calorie malnutrition: Secondary | ICD-10-CM | POA: Diagnosis not present

## 2021-06-13 DIAGNOSIS — G250 Essential tremor: Secondary | ICD-10-CM | POA: Diagnosis not present

## 2021-06-13 DIAGNOSIS — I10 Essential (primary) hypertension: Secondary | ICD-10-CM | POA: Diagnosis not present

## 2021-06-13 DIAGNOSIS — I610 Nontraumatic intracerebral hemorrhage in hemisphere, subcortical: Secondary | ICD-10-CM | POA: Diagnosis not present

## 2021-06-13 LAB — URINE CULTURE: Culture: <10000 — AB

## 2021-06-13 NOTE — Progress Notes (Signed)
Speech Language Pathology Daily Session Note  Patient Details  Name: Shelby Hill MRN: [id number] Date of Birth: 30-Nov-1935  Today's Date: 06/13/2021 SLP Individual Time: 1010-1105 SLP Individual Time Calculation (min): 55 min  Short Term Goals: Week 1: SLP Short Term Goal 1 (Week 1): Patient will utilize multi-modal communication to demonstrate orientation to place, time and situation with Mod A multimodal cues. SLP Short Term Goal 2 (Week 1): Patient will answer direct, open-ended questions with only 1-2 word utterances with Mod A multimodal cues. SLP Short Term Goal 3 (Week 1): Patient will self-monitor and correct errors during written expression with Mod verbal and visual cues. SLP Short Term Goal 4 (Week 1): Patient will demosntrate sustained attention to functional tasks for 30 minutes with Min verbal cues for redirection.  Skilled Therapeutic Interventions: Skilled treatment session focused on cognitive-linguistic goals. Upon arrival, patient was awake in bed and agreeable to treatment session. Patient able to answer basic yes/no and direct questions regarding wants/needs appropriately. Patient transferred to the wheelchair with Min verbal cues for problem solving with task. SLP facilitated session by providing a 4-step picture sequencing task that she completed with overall Mod A verbal cues for problem solving and Min verbal cues for sustained attention to task. Patient also provided verbal descriptions of the pictures with Mod semantic cues needed for word-finding. Patient required Mod verbal cues for orientation to place and time but was independently oriented to situation. Patient with significantly reduced confusion and jargon today. Patient left upright in the wheelchair with alarm on, all needs within reach and family present. Continue with current plan of care.      Pain Pain Assessment Pain Scale: 0-10 Pain Score: 0-No pain  Therapy/Group: Individual Therapy  Gricelda Foland,  Monice Lundy 06/13/2021, 11:42 AM

## 2021-06-13 NOTE — Progress Notes (Signed)
Occupational Therapy Session Note  Patient Details  Name: Shelby Hill MRN: [id number] Date of Birth: 1935-12-29  Today's Date: 06/13/2021 OT Individual Time: 0900-1011 OT Individual Time Calculation (min): 71 min    Short Term Goals: Week 1:  OT Short Term Goal 1 (Week 1): Patient will complete LB dressing with min A OT Short Term Goal 2 (Week 1): Patient will maintain standing balance at the sink with CGA in preparation for BADL task OT Short Term Goal 3 (Week 1): Patient will orient shirt with min questioning cues.   Skilled Therapeutic Interventions/Progress Updates:    Pt greeted at time of session sitting up in wheelchair at nurses station, finished taking meds prior to OT session. Pt transported to room for oral hygiene and face washing seated, cues for sequencing and problem solving. Transported to ADL apartment for low stim environment and familiar tasks. Ambulated throughout kitchen 2 trials first with RW and second without, gathering 9/9 food items in cabinets and counter surface with MOD cues to locate, CGA/HHA throughout. Pt needing to use bathroom at this time, transported back to room and ambulated throughout room HHA to/from commode > sink for hand hygiene > back to wheelchair. Set up at [facility] for 3 rounds of memory/cognitive task of remembering words/pictures in sequence with pt able to remember up to 3 with varying success. Note tremor in hands throughout tasks, unclear if 2/2 nervousness. Back in room set up with alarm on call bell in reach and made nursing aware. Nursing states pt is to be in bed with wrist restrains when alone, re-entered and pt stand pivot HHA to bed and placed restraints. Extended time needed as pt hyperverbal, aphasic, and decreased awareness. Alarm on call bell in reach and educated to press red button if needs anything.   Therapy Documentation Precautions:  Precautions Precautions: Fall (Simultaneous filing. User may not have seen previous  data.) Precaution Comments: R knee problems recently Restrictions Weight Bearing Restrictions: No     Therapy/Group: Individual Therapy  Viona Gilmore 06/13/2021, 7:24 AM

## 2021-06-13 NOTE — Progress Notes (Signed)
Physical Therapy Session Note  Patient Details  Name: Shelby Hill MRN: [id number] Date of Birth: [birth year]  Today's Date: 06/13/2021 PT Individual Time: 2094-7096 PT Individual Time Calculation (min): 55 min   Short Term Goals: Week 1:  PT Short Term Goal 1 (Week 1): Pt will perform bed mobility with CGA. PT Short Term Goal 2 (Week 1): Pt will perform sit to stand consistently with CGA. PT Short Term Goal 3 (Week 1): Pt will perform bed to chair consistently with CGA. PT Short Term Goal 4 (Week 1): Pt will ambulate x150' with CGA and LRAD.  Skilled Therapeutic Interventions/Progress Updates:     Pt received supine in bed with NT eating breakfast. Agreeable to therapy and no complaint of pain. Supine to sit with verbal cues for initiation, sequencing, and positioning. Stand step transfer to [facility] with RW and minA, with cues for anterior weight shift, trunk extension, and hand placement. WC transport to gym for time management. Pt performs BITS system while standing to provide standing balnace challenge with cognitive overlay. First, pt attempts auditory/visual -> picture recall with sequencing incorporated. Pt can correctly sequence 2 nouns with minimal cueing >75% of time, but cannot consistently sequence >2. Activity adjusted due to pt becoming frustrated at difficulty of task. Pt then performs path finding activity with "connect the dots" presentation, and is able to accurately complete with moderate cueing, primarily to remain on task due to becoming easily distracted. Activity involves pt reaching outside BOS with opposite upper extremity stabilization on RW.   Pt performs gait training with RW, ambulating >500' with CGA and cues for upright gaze to improve posture and balance, and increasing proximity to RW for safety, and occasional cues to remain on task due to pt becoming externally distracted or profusely verbal. Cues for RW management and hand placement while returning to [facility].   Pt left  seated in WC at RN station. Alarm belt intact.  Therapy Documentation Precautions:  Precautions Precautions: Fall (Simultaneous filing. User may not have seen previous data.) Precaution Comments: R knee problems recently Restrictions Weight Bearing Restrictions: No   Therapy/Group: Individual Therapy  Breck Coons, PT, DPT 06/13/2021, 12:16 PM

## 2021-06-13 NOTE — Progress Notes (Addendum)
Occupational Therapy Session Note  Patient Details  Name: Shelby Hill MRN: [id number] Date of Birth: Dec 05, 1935  Today's Date: 06/13/2021 OT Individual Time: 5075-7322 OT Individual Time Calculation (min): 26 min    Short Term Goals: Week 1:  OT Short Term Goal 1 (Week 1): Patient will complete LB dressing with min A OT Short Term Goal 2 (Week 1): Patient will maintain standing balance at the sink with CGA in preparation for BADL task OT Short Term Goal 3 (Week 1): Patient will orient shirt with min questioning cues.  Skilled Therapeutic Interventions/Progress Updates:    Pt received semi-reclined in bed with husband present, no s/sx pain but declining OOB activity, agreeable to therapy at bed level. Session focus on problem solving, multistep command follow, color / letter /number recognition prep for improved ADL/IADL/func mobility performance + decreased caregiver burden. Pt required mod A to copy simple lego brick design when built by therapist, but required total A to copy design from picture. Pt able to name all colors appropriately, but required step-by-step cues to place in correct place/manipulate brick correctly.  Able to correctly read numbers from slide circles and able to put in numerical order with overall min A, but unable to follow multistep commands "place 3 green blocks on green circle."  Pt left semi-reclined in bed with husband present with bed alarm / telesitter engaged, call bell in reach, and all immediate needs met.    Therapy Documentation Precautions:  Precautions Precautions: Fall (Simultaneous filing. User may not have seen previous data.) Precaution Comments: R knee problems recently Restrictions Weight Bearing Restrictions: No  Pain: no s/sx throughout session   ADL: See Care Tool for more details.   Therapy/Group: Individual Therapy  Volanda Napoleon MS, OTR/L  06/13/2021, 6:54 AM

## 2021-06-13 NOTE — Progress Notes (Signed)
PROGRESS NOTE   Subjective/Complaints:  Pt with much better night per nursing. Slept most of night. Awake and alert this morning when I came in. Still with confusion  ROS: Limited due to cognitive/behavioral   Objective:   No results found. No results for input(s): WBC, HGB, HCT, PLT in the last 72 hours. No results for input(s): NA, K, CL, CO2, GLUCOSE, BUN, CREATININE, CALCIUM in the last 72 hours.  Intake/Output Summary (Last 24 hours) at 06/13/2021 0829 Last data filed at 06/13/2021 0810 Gross per 24 hour  Intake 420 ml  Output --  Net 420 ml        Physical Exam: Vital Signs Blood pressure 118/70, pulse 69, temperature 98.6 F (37 C), resp. rate 18, height 5\' 6"  (1.676 m), weight 104.2 kg, SpO2 99 %.   Constitutional: No distress . Vital signs reviewed. HEENT: NCAT, EOMI, oral membranes moist Neck: supple Cardiovascular: RRR without murmur. No JVD    Respiratory/Chest: CTA Bilaterally without wheezes or rales. Normal effort    GI/Abdomen: BS +, non-tender, non-distended Ext: no clubbing, cyanosis, or edema Psych: pleasantly confused Skin: No evidence of breakdown, no evidence of rash Neuro:  alert, oriented to person. Distracted with word finding deficits, language of confusion. .tremor still present but improved.  Decreased FMC, FTN generally d/t tremor. Sensed pain in all 4's. No resting tone, DTR's 1+ Musculoskeletal: no pain with limb, trunk rom   Assessment/Plan: 1. 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 Physiatrist is providing close team supervision and 24 hour management of active medical problems listed below. Physiatrist and rehab team continue to assess barriers to discharge/monitor patient progress toward functional and medical goals  Care Tool:  Bathing    Body parts bathed  by patient: Right arm, Left arm, Chest, Abdomen, Front perineal area, Right upper leg, Left upper leg, Face   Body parts bathed by helper: Buttocks, Right lower leg, Left lower leg     Bathing assist Assist Level: Minimal Assistance - Patient > 75%     Upper Body Dressing/Undressing Upper body dressing   What is the patient wearing?: Pull over shirt    Upper body assist Assist Level: Minimal Assistance - Patient > 75%    Lower Body Dressing/Undressing Lower body dressing      What is the patient wearing?: Pants     Lower body assist Assist for lower body dressing: Maximal Assistance - Patient 25 - 49%     Toileting Toileting    Toileting assist Assist for toileting: Moderate Assistance - Patient 50 - 74%     Transfers Chair/bed transfer  Transfers assist     Chair/bed transfer assist level: Minimal Assistance - Patient > 75%     Locomotion Ambulation   Ambulation assist      Assist level: Moderate Assistance - Patient 50 - 74% Assistive device: Hand held assist Max distance: 25'   Walk 10 feet activity   Assist     Assist level: Moderate Assistance - Patient - 50 - 74% Assistive device: Hand held assist   Walk 50 feet activity  Assist Walk 50 feet with 2 turns activity did not occur: Safety/medical concerns         Walk 150 feet activity   Assist Walk 150 feet activity did not occur: Safety/medical concerns         Walk 10 feet on uneven surface  activity   Assist     Assist level: Moderate Assistance - Patient - 50 - 74% Assistive device:  (R hand rail)   Wheelchair     Assist Is the patient using a wheelchair?: No             Wheelchair 50 feet with 2 turns activity    Assist            Wheelchair 150 feet activity     Assist          Blood pressure 118/70, pulse 69, temperature 98.6 F (37 C), resp. rate 18, height 5\' 6"  (1.676 m), weight 104.2 kg, SpO2 99 %.  Medical Problem List and  Plan: 1. 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  Antithrombotics: -DVT/anticoagulation:   none given IPH             -antiplatelet therapy: none 3. Pain Management: Tylenol PRN 4. Anxiety: Xanax .0.25 mg q HS PRN sleep, anxiety             -antipsychotic agents: seroquel  1/18 HS confusion/sundowning--did much better last night -continue 12.5mg  seroquel at 1600 and 2100 daily  -ua neg, ucx pending -spoke with husband yesterday re plan 5. Neuropsych: This patient is not capable of making decisions on her own behalf. 6. Skin/Wound Care: Routine skin checks 7. Fluids/Electrolytes/Nutrition: intake still variable at times d/t cognition  -ate 25-50-50 over last 3 meals  -continue protein supplement 8. Hypertension:   --No home medications PTA Vitals:   06/12/21 1900 06/13/21 0607  BP: 136/68 118/70  Pulse: 77 69  Resp: 16 18  Temp: 98 F (36.7 C) 98.6 F (37 C)  SpO2: 96% 99%  Controlled 1/18 on propranolol for tremor, no bradycardia  9. Hyperlipidemia: continue Crestor 10: DM-2: carb modified diet; Hgb A1c = 5.5.    -no cbg checks needed 11: Trigeminal neuralgia: continue Lamictal, duloxetine and Lyrica.  --Followed by [facility]. --seems controlled. Has been on these meds chronicaly 12. Essential tremor: on bid propranolol with some improvement  -appears improved--will conitnue current dosing   LOS: 6 days A FACE TO FACE EVALUATION WAS PERFORMED  Meredith Staggers 06/13/2021, 8:29 AM

## 2021-06-14 LAB — BASIC METABOLIC PANEL
Anion gap: 8 (ref 5–15)
BUN: 14 mg/dL (ref 8–23)
CO2: 26 mmol/L (ref 22–32)
Calcium: 8.7 mg/dL — ABNORMAL LOW (ref 8.9–10.3)
Chloride: 101 mmol/L (ref 98–111)
Creatinine, Ser: 0.77 mg/dL (ref 0.44–1.00)
GFR, Estimated: >60 mL/min (ref >60)
Glucose, Bld: 101 mg/dL — ABNORMAL HIGH (ref 70–99)
Potassium: 3.7 mmol/L (ref 3.5–5.1)
Sodium: 135 mmol/L (ref 135–145)

## 2021-06-14 LAB — CBC
HCT: 37.3 % (ref 36.0–46.0)
Hemoglobin: 12.2 g/dL (ref 12.0–15.0)
MCH: 30 pg (ref 26.0–34.0)
MCHC: 32.7 g/dL (ref 30.0–36.0)
MCV: 91.6 fL (ref 80.0–100.0)
Platelets: 200 10*3/uL (ref 150–400)
RBC: 4.07 MIL/uL (ref 3.87–5.11)
RDW: 13.3 % (ref 11.5–15.5)
WBC: 6.1 10*3/uL (ref 4.0–10.5)
nRBC: 0 % (ref 0.0–0.2)

## 2021-06-14 NOTE — Progress Notes (Signed)
Physical Therapy Session Note  Patient Details  Name: Shelby Hill MRN: [id number] Date of Birth: 1935-06-10  Today's Date: 06/14/2021 PT Individual Time: 1032-1058 PT Skilled Minutes: 26 min  Short Term Goals: Week 1:  PT Short Term Goal 1 (Week 1): Pt will perform bed mobility with CGA. PT Short Term Goal 2 (Week 1): Pt will perform sit to stand consistently with CGA. PT Short Term Goal 3 (Week 1): Pt will perform bed to chair consistently with CGA. PT Short Term Goal 4 (Week 1): Pt will ambulate x150' with CGA and LRAD.  Skilled Therapeutic Interventions/Progress Updates:     Pt received seated in [facility] with RN present. Handed off to PT. Pt agreeable to therapy and does not complain of pain. Pt dons shoes with setup assistance. WC transport to gym for time management. Pt performs sit to stand with CGA and cues for initiation. Pt ambulates x200' with HHA on the R and cues to remain on task as pt is easily internally and externally distracted. Pt takes several brief standing rest breaks and appears to indicate pain in L knee. Chronic in nature. Number not provided. PT provides rest breaks and mobility to manage pain. Pt then ambulates x200' with SPC and CGA, with cues for management of AD, navigation of busy environment, and sequencing and postioning for safe transfer back to [place]. Pt left seated in WC with alarm intact and all needs within reach.  Therapy Documentation Precautions:  Precautions Precautions: Fall (Simultaneous filing. User may not have seen previous data.) Precaution Comments: R knee problems recently Restrictions Weight Bearing Restrictions: No    Therapy/Group: Individual Therapy  Breck Coons, PT, DPT 06/14/2021, 3:57 PM

## 2021-06-14 NOTE — Progress Notes (Incomplete)
Physical Therapy Session Note  Patient Details  Name: Shelby Hill MRN: [id number] Date of Birth: 10-30-1935  Today's Date: 06/14/2021 PT Individual Time:  -      Short Term Goals: Week 1:  PT Short Term Goal 1 (Week 1): Pt will perform bed mobility with CGA. PT Short Term Goal 2 (Week 1): Pt will perform sit to stand consistently with CGA. PT Short Term Goal 3 (Week 1): Pt will perform bed to chair consistently with CGA. PT Short Term Goal 4 (Week 1): Pt will ambulate x150' with CGA and LRAD. Week 2:    Week 3:     Skilled Therapeutic Interventions/Progress Updates:      Therapy Documentation Precautions:  Precautions Precautions: Fall (Simultaneous filing. User may not have seen previous data.) Precaution Comments: R knee problems recently Restrictions Weight Bearing Restrictions: No Exercises:   Other Treatments:      Therapy/Group: Individual Therapy Callie Fielding, PT   Jerrilyn Cairo 06/14/2021, 7:54 AM

## 2021-06-14 NOTE — Progress Notes (Signed)
Recreational Therapy Session Note  Patient Details  Name: Shelby Hill MRN: [id number] Date of Birth: 06-17-1935 Today's Date: 06/14/2021   Order received, chart reviewed and discussion with OT about pts appropriateness for TR services.  TR eval deferred at this time.  Will continue to monitor through team for future participation.  June Park 06/14/2021, 11:56 AM

## 2021-06-14 NOTE — Progress Notes (Signed)
Physical Therapy Session Note  Patient Details  Name: Shelby Hill MRN: [id number] Date of Birth: 1936-03-13  Today's Date: 06/14/2021 PT Individual Time: 1300-1405 PT Individual Time Calculation (min): 65 min   Short Term Goals: Week 1:  PT Short Term Goal 1 (Week 1): Pt will perform bed mobility with CGA. PT Short Term Goal 2 (Week 1): Pt will perform sit to stand consistently with CGA. PT Short Term Goal 3 (Week 1): Pt will perform bed to chair consistently with CGA. PT Short Term Goal 4 (Week 1): Pt will ambulate x150' with CGA and LRAD.  Skilled Therapeutic Interventions/Progress Updates:    Pt seated in w/c on arrival and agreeable to therapy with her husband present. No complaint of pain at this time. Pt stood and ambulated to gym with RW and CGA. Pt directed in gait trial with [facility], per family report that primary therapist would like to start using [place] over RW. Pt ambulated around cone ~10 ft with CGA. Pt directed in side stepping along line of cones for visual cue with side step, with min A and SPC for balance. Pt unable to keep L foot with neutral rotation. Transitioned to weaving through cones to retrieve cup across room. Mod-max VC for sequencing and recall of instructions. Pt then directed in cleaning up cones and cup. Required max cueing for visual scanning to locate like cup and place on shelf. Pt picked up cones from floor with min A for balance with SPC. At this time pt reported onset of knee pain, rest breaks and positioning as needed.  Pt ambulated to countertop and put away cones on shelf with demonstrative and specific cueing, incr time for processing. Pt able to maintain balance with countertop with CGA while reaching over head. Pt requires frequent cueing to remember to use cane and bring it with her when she leaves an area. Pt navigated ramp with SPC and hand rail, no LOB noted. Pt demoes some confusion at this time, trying to go out gym door instead of back to mat table. Pt  then ambulated back to room in the same manner and returned to w/c. Pt was left with all needs in reach and alarm active, family present.   Therapy Documentation Precautions:  Precautions Precautions: Fall (Simultaneous filing. User may not have seen previous data.) Precaution Comments: R knee problems recently Restrictions Weight Bearing Restrictions: No General:       Therapy/Group: Individual Therapy  Mickel Fuchs 06/14/2021, 1:16 PM

## 2021-06-14 NOTE — Progress Notes (Signed)
Occupational Therapy Session Note ° °Patient Details  °Name: Shelby Hill °MRN: [id number] °Date of Birth: 08/02/1935 ° °Today's Date: 06/14/2021 °OT Individual Time: 0850-0945 °OT Individual Time Calculation (min): 55 min  ° ° °Short Term Goals: °Week 1:  OT Short Term Goal 1 (Week 1): Patient will complete LB dressing with min A °OT Short Term Goal 2 (Week 1): Patient will maintain standing balance at the sink with CGA in preparation for BADL task °OT Short Term Goal 3 (Week 1): Patient will orient shirt with min questioning cues. ° °Skilled Therapeutic Interventions/Progress Updates:  °  Pt greeted semi-reclined in bed with wrist restraints on. Pt calm and agreeable today, no agitation throughout session and easy to redirect. Pt still with confusion, but much less jargon today. Pt oriented to place and situation, but disoriented to time. Pt completed bed mobility with cues and supervision. Pt declined to shower or go to the bathroom, but wanted to change out of blue scrubs into clothes from home. Pt needed min cues for initiation and to orient clothing. Pt then ambulated to the sink with verbal cues for RW management and supervision. Pt needed cues to locate toothbrush and toothpaste, then initiate toothbrushing task. Pt then needed cues to locate comb. Pt knew what to do with the comb, brought to her head, but needed cues to complete task fully. Pt needed cues for RW management to safely ambulate and turn to the wheelchair. Pt brought to quiet therapy gym. Worked on memory and attention using BITS with word and image matching activity. Pt with improved recall initially with 2 word/image sequence. Went up to 3 words with pt able to complete with min/mod cues on 2/5 occasions. Another patient and therapist entered the gym and pt had increased difficulty maintaining attention in distracting environment. Pt returned to room and left seated in wc with alarm belt on, call bell in reach, and needs met . ° °Therapy  Documentation °Precautions:  °Precautions °Precautions: Fall (Simultaneous filing. User may not have seen previous data.) °Precaution Comments: R knee problems recently °Restrictions °Weight Bearing Restrictions: No ° °Pain: °Pain Assessment °Pain Scale: 0-10 °Pain Score: 0-No pain ° ° °Therapy/Group: Individual Therapy ° ° S  °06/14/2021, 9:48 AM °

## 2021-06-14 NOTE — Progress Notes (Signed)
PROGRESS NOTE   Subjective/Complaints:  Seems to have done fairly well last night. Did better with therapy yesterday. SLP reported reduced confusion and jargon  ROS: limited due to language/communication    Objective:   No results found. Recent Labs    06/14/21 0519  WBC 6.1  HGB 12.2  HCT 37.3  PLT 200   Recent Labs    06/14/21 0519  NA 135  K 3.7  CL 101  CO2 26  GLUCOSE 101*  BUN 14  CREATININE 0.77  CALCIUM 8.7*    Intake/Output Summary (Last 24 hours) at 06/14/2021 1039 Last data filed at 06/14/2021 0803 Gross per 24 hour  Intake 370 ml  Output --  Net 370 ml        Physical Exam: Vital Signs Blood pressure 120/61, pulse 74, temperature 98.5 F (36.9 C), temperature source Oral, resp. rate 16, height 5\' 6"  (1.676 m), weight 104.2 kg, SpO2 94 %.   Constitutional: No distress . Vital signs reviewed. HEENT: NCAT, EOMI, oral membranes moist Neck: supple Cardiovascular: RRR without murmur. No JVD    Respiratory/Chest: CTA Bilaterally without wheezes or rales. Normal effort    GI/Abdomen: BS +, non-tender, non-distended Ext: no clubbing, cyanosis, or edema Psych: pleasant and cooperative  Skin: No evidence of breakdown, no evidence of rash Neuro:  alert, halting speech, word finding deficits. Confused. .tremor appears improved.  Decreased FMC,   Sensed pain in all 4's. No resting tone, DTR's 1+ Musculoskeletal: no limb or trunk pain   Assessment/Plan: 1. 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 Physiatrist is providing close team supervision and 24 hour management of active medical problems listed below. Physiatrist and rehab team continue to assess barriers to discharge/monitor patient progress toward functional and medical goals  Care Tool:  Bathing    Body parts bathed by  patient: Right arm, Left arm, Chest, Abdomen, Front perineal area, Right upper leg, Left upper leg, Face   Body parts bathed by helper: Buttocks, Right lower leg, Left lower leg     Bathing assist Assist Level: Minimal Assistance - Patient > 75%     Upper Body Dressing/Undressing Upper body dressing   What is the patient wearing?: Pull over shirt    Upper body assist Assist Level: Minimal Assistance - Patient > 75%    Lower Body Dressing/Undressing Lower body dressing      What is the patient wearing?: Pants     Lower body assist Assist for lower body dressing: Maximal Assistance - Patient 25 - 49%     Toileting Toileting    Toileting assist Assist for toileting: Moderate Assistance - Patient 50 - 74%     Transfers Chair/bed transfer  Transfers assist     Chair/bed transfer assist level: Minimal Assistance - Patient > 75%     Locomotion Ambulation   Ambulation assist      Assist level: Moderate Assistance - Patient 50 - 74% Assistive device: Hand held assist Max distance: 25'   Walk 10 feet activity   Assist     Assist level: Moderate Assistance -  Patient - 50 - 74% Assistive device: Hand held assist   Walk 50 feet activity   Assist Walk 50 feet with 2 turns activity did not occur: Safety/medical concerns         Walk 150 feet activity   Assist Walk 150 feet activity did not occur: Safety/medical concerns         Walk 10 feet on uneven surface  activity   Assist     Assist level: Moderate Assistance - Patient - 50 - 74% Assistive device:  (R hand rail)   Wheelchair     Assist Is the patient using a wheelchair?: No             Wheelchair 50 feet with 2 turns activity    Assist            Wheelchair 150 feet activity     Assist          Blood pressure 120/61, pulse 74, temperature 98.5 F (36.9 C), temperature source Oral, resp. rate 16, height 5\' 6"  (1.676 m), weight 104.2 kg, SpO2 94  %.  Medical Problem List and Plan: 1. 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  Antithrombotics: -DVT/anticoagulation:   none given IPH             -antiplatelet therapy: none 3. Pain Management: Tylenol PRN 4. Anxiety: Xanax .0.25 mg q HS PRN sleep, anxiety             -antipsychotic agents: seroquel  1/19 HS confusion/sundowning--seems to have had two better nights -continue 12.5mg  seroquel at 1600 and 2100 daily  -ua neg, ucx neg also 5. Neuropsych: This patient is not capable of making decisions on her own behalf. 6. Skin/Wound Care: Routine skin checks 7. Fluids/Electrolytes/Nutrition:    -intake fair  -continue protein supplement 8. Hypertension:   --No home medications PTA Vitals:   06/13/21 1900 06/14/21 0529  BP: 124/62 120/61  Pulse: 74 74  Resp: 18 16  Temp: 98.5 F (36.9 C) 98.5 F (36.9 C)  SpO2: 97% 94%  Controlled 1/19 on propranolol for tremor--continue same regimen  9. Hyperlipidemia: continue Crestor 10: DM-2: carb modified diet; Hgb A1c = 5.5.    -no cbg checks needed 11: Trigeminal neuralgia: continue Lamictal, duloxetine and Lyrica.  --Followed by [facility]. --seems controlled. Has been on these meds chronicaly 12. Essential tremor: on bid propranolol with some improvement  -appears improved--will conitnue current dosing   LOS: 7 days A FACE TO FACE EVALUATION WAS PERFORMED  Meredith Staggers 06/14/2021, 10:39 AM

## 2021-06-14 NOTE — Progress Notes (Signed)
Patient ID: Shelby Hill, female   DOB: [birth year], 86 y.o.   MRN: [id number]  SW received phone call from pt son Gwyndolyn Saxon clarifying services pt mother will need at time of discharge. SW confirmed 24/7 care is needed, however, they will have to arrange. Reprots he misunderstood as they thought the list provided was 24/7 care agency list. SW explained the list provided was for [facility] and will send a referral to preferred agency once aware. SW emailed him a Advertising copywriter. Indicates will follow-up.   Loralee Pacas, MSW, [place] Office: [phone] Cell: [phone] Fax: [phone]

## 2021-06-14 NOTE — Progress Notes (Signed)
Speech Language Pathology Daily Session Note  Patient Details  Name: Shelby Hill MRN: [id number] Date of Birth: December 14, 1935  Today's Date: 06/14/2021 SLP Individual Time: 1447-1530 SLP Individual Time Calculation (min): 43 min  Short Term Goals: Week 1: SLP Short Term Goal 1 (Week 1): Patient will utilize multi-modal communication to demonstrate orientation to place, time and situation with Mod A multimodal cues. SLP Short Term Goal 2 (Week 1): Patient will answer direct, open-ended questions with only 1-2 word utterances with Mod A multimodal cues. SLP Short Term Goal 3 (Week 1): Patient will self-monitor and correct errors during written expression with Mod verbal and visual cues. SLP Short Term Goal 4 (Week 1): Patient will demosntrate sustained attention to functional tasks for 30 minutes with Min verbal cues for redirection.  Skilled Therapeutic Interventions: Pt seen for skilled ST with focus on cognitive communication goals, up in wheelchair with husband and daughter in room. Pt continues to be extremely verbose with speech characterized as fluent jargon. Pt also appears to be confused and mildly agitated, unable to answer who husband and daughter were. Pt unable to answer simple questions, focused attention severely diminished. SLP brought pt to Speech room to attempt to control environment. Pt continued with mild agitation in the hallway but calmed in quite low stim speech room. Pt able to answer simple biographical questions with <20% accuracy provided max A cues. At this time, pt grabbing pen and paper and began to make a list of names. This appeared to calm patient and she was able to sustain attention to this task for ~10 minutes. SLP facilitating simple naming task by providing max A multimodal cues, pt able to name items 10% of opportunities however was able to read name of item 100% accuracy. Pt brought back to room and family encouraged to bring coloring activities for patient which  seemed to reduce agitation and increase focus. Pt much more calm and less verbose at end of tx session, left in wheelchair with alarm belt activated and family present for needs. Cont ST POC.   Pain Pain Assessment Pain Scale: 0-10 Pain Score: 0-No pain  Therapy/Group: Individual Therapy  Dewaine Conger 06/14/2021, 3:33 PM

## 2021-06-15 NOTE — Progress Notes (Signed)
Physical Therapy Weekly Progress Note  Patient Details  Name: Shelby Hill MRN: [id number] Date of Birth: 08-Aug-1935  Beginning of progress report period: June 08, 2021 End of progress report period: June 15, 2021  Today's Date: 06/15/2021 PT Individual Time: 1020-1115 PT Individual Time Calculation (min): 55 min   Patient has met 4 of 4 short term goals.  Pt is progressing well toward mobility goals, improving independence with bed mobility, balance, functional transfers, and ambulation. Pt is performing all mobility tasks at [facility] level with verbal and tactile cues for improved sequencing, posture, and to maintain on task due to cognitive deficits and becoming easily distracted. Pt will benefit form family education prior to discharge.  Patient continues to demonstrate the following deficits muscle weakness, decreased cardiorespiratoy endurance, decreased attention, decreased awareness, decreased problem solving, decreased safety awareness, and decreased memory, and decreased standing balance and decreased balance strategies and therefore will continue to benefit from skilled PT intervention to increase functional independence with mobility.  Patient progressing toward long term goals..  Continue plan of care.  PT Short Term Goals Week 1:  PT Short Term Goal 1 (Week 1): Pt will perform bed mobility with CGA. PT Short Term Goal 1 - Progress (Week 1): Met PT Short Term Goal 2 (Week 1): Pt will perform sit to stand consistently with CGA. PT Short Term Goal 2 - Progress (Week 1): Met PT Short Term Goal 3 (Week 1): Pt will perform bed to chair consistently with CGA. PT Short Term Goal 3 - Progress (Week 1): Met PT Short Term Goal 4 (Week 1): Pt will ambulate x150' with CGA and LRAD. PT Short Term Goal 4 - Progress (Week 1): Met Week 2:  PT Short Term Goal 1 (Week 2): STGs = LTGs  Skilled Therapeutic Interventions/Progress Updates:  Ambulation/gait training;Community  reintegration;DME/adaptive equipment instruction;Neuromuscular re-education;Psychosocial support;Stair training;UE/LE Strength taining/ROM;Balance/vestibular training;Discharge planning;Functional electrical stimulation;Pain management;Skin care/wound management;Therapeutic Activities;UE/LE Coordination activities;Cognitive remediation/compensation;Disease management/prevention;Functional mobility training;Patient/family education;Splinting/orthotics;Therapeutic Exercise;Visual/perceptual remediation/compensation   Pt received seated in WC in agrees to therapy. No complaint of pain. WC transport to gym for time management. Pt performs sit to stand with CGA and cues for initiation. Pt ambulates x450' with single point cane in R hand. PT provides CGA and verbal and tactile cues for upright posture to improve balance, and increasing stride length to decrease risk for falls. Cues also provided to redirect pt to task due to becoming easily internally and externally distracted. Additional cues provided for optimal sequencing of transfer to mat table for safety. Pt takes seated rest break. Pt ambulates x20' to Nustep. Pt performs nustep for strength and endurance training as well as challenging pt's attention to task. Pt performs at workload of 5 with minimal cues to maintain on task. Pt completes x15:00 with average steps per minute ~30, though PT cues pt to attempt to maintain 40 steps per minute. Pt perform stand step transfer back to [facility] with CGA and cues for positioning. Left seated with husband present, alarm intact and all needs within reach.  Therapy Documentation Precautions:  Precautions Precautions: Fall (Simultaneous filing. User may not have seen previous data.) Precaution Comments: R knee problems recently Restrictions Weight Bearing Restrictions: No   Therapy/Group: Individual Therapy  Breck Coons, PT, DPT 06/15/2021, 4:40 PM

## 2021-06-15 NOTE — Progress Notes (Signed)
Occupational Therapy Weekly Progress Note  Patient Details  Name: Shelby Hill MRN: [id number] Date of Birth: 1936/03/16  Beginning of progress report period: June 08, 2021 End of progress report period: June 15, 2021  Today's Date: 06/15/2021 OT Individual Time: 0272-5366 OT Individual Time Calculation (min): 71 min    Patient has met 2 of 3 short term goals.  Patient is making slow but steady progress towards OT goals. Pt is at a min A level for functional BADL tasks but still needs mod-max cues for initiation, sequencing, and problem solving. Pt also continues to have language of confusion with poor safety awareness within functional tasks. Pt with incontinence at times and unaware. She continues to have some agitation and confusion in the evenings, but agitation is much better during the day. Anticipate patient will continue to require 24/7 supervision at discharge due to cognitive deficits.   Patient continues to demonstrate the following deficits: muscle weakness, decreased motor planning, decreased initiation, decreased attention, decreased awareness, decreased problem solving, decreased safety awareness, decreased memory, and delayed processing, and decreased sitting balance, decreased standing balance, decreased postural control, hemiplegia, and decreased balance strategies and therefore will continue to benefit from skilled OT intervention to enhance overall performance with BADL and Reduce care partner burden.  Patient progressing toward long term goals..  Continue plan of care.  OT Short Term Goals Week 1:  OT Short Term Goal 1 (Week 1): Patient will complete LB dressing with min A OT Short Term Goal 1 - Progress (Week 1): Met OT Short Term Goal 2 (Week 1): Patient will maintain standing balance at the sink with CGA in preparation for BADL task OT Short Term Goal 2 - Progress (Week 1): Met OT Short Term Goal 3 (Week 1): Patient will orient shirt with min questioning  cues. OT Short Term Goal 3 - Progress (Week 1): Progressing toward goal Week 2:  OT Short Term Goal 1 (Week 2): LTG=STG 2/2 ELOS  Skilled Therapeutic Interventions/Progress Updates:    Pt greeted seated in wc with family present. Pt confused as to why OT was there and kept perseverating on asking nonsensical questions to family members. Pt declined need to go to the bathroom and was redirected to ambulate to the gym with singe point cane and min A. Using the BITS, OT engaged pt in simple shape naming activity. Pt with difficulty abstracting from large screen, which was distracting to patient. OT graded task, and used pen and paper to work on naming and drawing of simple shapes. Pt needed max cues and demonstration from OT to draw each shape initially, then after practice, pt was able to follow one step command to draw simple shapes. Addressed problem solving and visuospatial skills with clock drawing. OT graded task by drawing circle for clock to help her initiate. Pt was then able to place the 12, 3, and 6 correctly. She then became distracted and was unable to problem solve to fill in the rest of the clock. Addressed standing balance/endurance standing on foam block. Pt fearfull while standing on foam requiring moderate assist to maintain balance due to posterior lean. Pt also needed multimodal cues and facilitation to achieve midline head and trunk position. Continued working on balance standing on flat surface with CGA for 3 minutes while addressing cognition using card matching task. Pt needed multimodal cues to initiate matching, but when OT told her a specific number to fine (out of 4 matching pairs) she was able to find it with increased time.  OT asked pt to follow simple command of placing cards back in plastic bag. Pt distracted by the cards themselves and needed max multimodal cues to follow simple command. Functional ambulation with obstacle course walking around cones and over small obstacles with  min A, moderate safety cues, and single point cane. Pt ambulated back to room with SPC and min A. Pt left seated in wc with alarm belt on, family present, and needs met.   Therapy Documentation Precautions:  Precautions Precautions: Fall (Simultaneous filing. User may not have seen previous data.) Precaution Comments: R knee problems recently Restrictions Weight Bearing Restrictions: No Pain:   Denies pain   Therapy/Group: Individual Therapy  Valma Cava 06/15/2021, 10:31 AM

## 2021-06-15 NOTE — Plan of Care (Signed)
°  Problem: Consults Goal: RH STROKE PATIENT EDUCATION Description: See Patient Education module for education specifics  Outcome: Progressing   Problem: RH BOWEL ELIMINATION Goal: RH STG MANAGE BOWEL WITH ASSISTANCE Description: STG Manage Bowel with Supervision Assistance. Outcome: Progressing Goal: RH STG MANAGE BOWEL W/MEDICATION W/ASSISTANCE Description: STG Manage Bowel with Medication with Supervision Assistance. Outcome: Progressing   Problem: RH BLADDER ELIMINATION Goal: RH STG MANAGE BLADDER WITH ASSISTANCE Description: STG Manage Bladder With Supervision Assistance Outcome: Progressing Goal: RH STG MANAGE BLADDER WITH MEDICATION WITH ASSISTANCE Description: STG Manage Bladder With Medication With Supervision Assistance. Outcome: Progressing   Problem: RH SKIN INTEGRITY Goal: RH STG MAINTAIN SKIN INTEGRITY WITH ASSISTANCE Description: STG Maintain Skin Integrity With Supervision Assistance. Outcome: Progressing Goal: RH STG ABLE TO PERFORM INCISION/WOUND CARE W/ASSISTANCE Description: STG Able To Perform Peri Care With Supervision Assistance. Outcome: Progressing   Problem: RH SAFETY Goal: RH STG ADHERE TO SAFETY PRECAUTIONS W/ASSISTANCE/DEVICE Description: STG Adhere to Safety Precautions With Cues and Reminders. Outcome: Progressing Goal: RH STG DECREASED RISK OF FALL WITH ASSISTANCE Description: STG Decreased Risk of Fall With Supervision Assistance. Outcome: Progressing   Problem: RH COGNITION-NURSING Goal: RH STG ANTICIPATES NEEDS/CALLS FOR ASSIST W/ASSIST/CUES Description: STG Anticipates Needs/Calls for Assist With Cues and Reminders. Outcome: Progressing   Problem: RH KNOWLEDGE DEFICIT Goal: RH STG INCREASE KNOWLEDGE OF HYPERTENSION Description: Patient will demonstrate knowledge of HTN medications, dietary recommendations, and BP management with educational materials and handouts provided by staff independently at discharge. Outcome: Progressing Goal:  RH STG INCREASE KNOWLEGDE OF HYPERLIPIDEMIA Description: Patient will demonstrate knowledge of HLD medications, dietary recommendations, and HLD management with educational materials and handouts provided by staff independently at discharge.  Outcome: Progressing   Problem: Safety: Goal: Non-violent Restraint(s) Outcome: Progressing

## 2021-06-15 NOTE — Progress Notes (Signed)
Occupational Therapy Session Note  Patient Details  Name: Shelby Hill MRN: [id number] Date of Birth: November 25, 1935  Today's Date: 06/15/2021 OT Individual Time: 7564-3329 OT Individual Time Calculation (min): 60 min    Short Term Goals: Week 1:  OT Short Term Goal 1 (Week 1): Patient will complete LB dressing with min A OT Short Term Goal 1 - Progress (Week 1): Met OT Short Term Goal 2 (Week 1): Patient will maintain standing balance at the sink with CGA in preparation for BADL task OT Short Term Goal 2 - Progress (Week 1): Met OT Short Term Goal 3 (Week 1): Patient will orient shirt with min questioning cues. OT Short Term Goal 3 - Progress (Week 1): Progressing toward goal  Skilled Therapeutic Interventions/Progress Updates:    Pt seen this session to facilitate attention, sequencing, motor planning and balance. Pt received in bed with breakfast tray in front of her but not wanting to eat it. She did eat half a banana.  Pt had a great deal of language of confusion and expressing herself but she did have a few sentences that came out fluid and clearly.    Pt did not want to shower and stated that she already used the bathroom. Asked pt to sit to EOB so we could get her pants on. At this point, noticed her brief was wet. Pt agreed to go to the bathroom to change her brief.   Using a SPC, pt ambulated with CGA.  Once on toilet she was able to void more.  She had significant difficulty with motor planning for cleansing herself and managing and her clothing over hips, but then no difficulty with doffing and donning her shirt.   Pt stepped into shower to bathe with mod cues to ensure she washed all areas. Pt anxious at start of session and by this point, she was much more relaxed.   Pt dried off with min A and completed dressing with min A for pants and shoes.  Ambulated to w/c to rest. She combed her hair.   Belt alarm on and all needs met. Telesitter on.   Therapy Documentation Precautions:   Precautions Precautions: Fall (Simultaneous filing. User may not have seen previous data.) Precaution Comments: R knee problems recently Restrictions Weight Bearing Restrictions: No  Pain: Pain Assessment Pain Score: 0-No pain ADL: ADL Eating: Set up Grooming: Setup, Supervision/safety Upper Body Bathing: Supervision/safety, Moderate cueing Where Assessed-Upper Body Bathing: Shower Lower Body Bathing: Minimal assistance, Moderate cueing Where Assessed-Lower Body Bathing: Shower Upper Body Dressing: Supervision/safety, Minimal cueing Lower Body Dressing: Minimal assistance, Moderate assistance Toileting: Moderate assistance Where Assessed-Toileting: Glass blower/designer: Contact guard, Minimal verbal cueing Toilet Transfer Method: Counselling psychologist: Raised toilet seat, Energy manager: Contact guard, Minimal cueing Social research officer, government Method: Heritage manager: Grab bars, Transfer tub bench   Therapy/Group: Individual Therapy   06/15/2021, 12:19 PM

## 2021-06-15 NOTE — Progress Notes (Signed)
Speech Language Pathology Weekly Progress and Session Note  Patient Details  Name: BRITTENY FIEBELKORN MRN: [id number] Date of Birth: 1935-10-31  Beginning of progress report period: June 07, 2021 End of progress report period: June 15, 2021  Today's Date: 06/15/2021 SLP Individual Time: 1430-1500 SLP Individual Time Calculation (min): 30 min  Short Term Goals: Week 1: SLP Short Term Goal 1 (Week 1): Patient will utilize multi-modal communication to demonstrate orientation to place, time and situation with Mod A multimodal cues. SLP Short Term Goal 1 - Progress (Week 1): Not met SLP Short Term Goal 2 (Week 1): Patient will answer direct, open-ended questions with only 1-2 word utterances with Mod A multimodal cues. SLP Short Term Goal 2 - Progress (Week 1): Met SLP Short Term Goal 3 (Week 1): Patient will self-monitor and correct errors during written expression with Mod verbal and visual cues. SLP Short Term Goal 3 - Progress (Week 1): Not met SLP Short Term Goal 4 (Week 1): Patient will demosntrate sustained attention to functional tasks for 30 minutes with Min verbal cues for redirection. SLP Short Term Goal 4 - Progress (Week 1): Not met    New Short Term Goals: Week 2: SLP Short Term Goal 1 (Week 2): STGs=LTGs due to ELOS  Weekly Progress Updates: Patient has made inconsistent gains and has met 1 of 4 STGs this reporting period. Currently, patient continues to demonstrate moderate-severe cognitive impairments impacting orientation, sustained attention, functional problem solving, recall of functional information and awareness of errors which impacts her safety with functional and familiar tasks. Patient also continues to demonstrate a moderate-severe fluent aphasia with phonemic paraphasias and impaired word-finding with minimal awareness of errors. Patient and family education ongoing. Patient would benefit from continued skilled SLP intervention to maximize her cognitive-linguistic  functioning prior to discharge.     Intensity: Minumum of 1-2 x/day, 30 to 90 minutes Frequency: 3 to 5 out of 7 days Duration/Length of Stay: 06/22/21 Treatment/Interventions: Cognitive remediation/compensation;Speech/Language facilitation;Cueing hierarchy;Environmental controls;Therapeutic Activities;Functional tasks;Patient/family education;Internal/external aids   Daily Session  Skilled Therapeutic Interventions:   Skilled treatment session focused on cognitive-linguistic goals. Upon arrival, patient was awake but with jargon and language of confusion. Patient agreeable to treatment session and brought to SLP office. To minimize verbosity and jargon, SLP facilitated session with a reading comprehension task. Patient able to match a picture to a written word from a field of 3 with visual confusions with 75% accuracy, with auditory confusion with 80% accuracy and with semantic confusions with 40% accuracy. Patient utilized humor while demonstrating increased awareness of errors and decreased word-finding. Patient demonstrated sustained attention to task for ~25 minutes with min verbal cues for redirection. Patient left upright in wheelchair with alarm on and all needs within reach. Continue with current plan of care.     Pain No/Denies Pain   Therapy/Group: Individual Therapy  Chatham Howington, [place] 06/15/2021, 6:10 AM

## 2021-06-15 NOTE — Progress Notes (Signed)
PROGRESS NOTE   Subjective/Complaints:  No new complaints. Did fairly well overnight. In reasonable spirits this morning  ROS: limited due to language/communication    Objective:   No results found. Recent Labs    06/14/21 0519  WBC 6.1  HGB 12.2  HCT 37.3  PLT 200   Recent Labs    06/14/21 0519  NA 135  K 3.7  CL 101  CO2 26  GLUCOSE 101*  BUN 14  CREATININE 0.77  CALCIUM 8.7*    Intake/Output Summary (Last 24 hours) at 06/15/2021 1014 Last data filed at 06/14/2021 1300 Gross per 24 hour  Intake 90 ml  Output --  Net 90 ml        Physical Exam: Vital Signs Blood pressure 129/67, pulse 71, temperature (!) 97.2 F (36.2 C), resp. rate 16, height 5\' 6"  (1.676 m), weight 104.2 kg, SpO2 97 %.   Constitutional: No distress . Vital signs reviewed. HEENT: NCAT, EOMI, oral membranes moist Neck: supple Cardiovascular: RRR without murmur. No JVD    Respiratory/Chest: CTA Bilaterally without wheezes or rales. Normal effort    GI/Abdomen: BS +, non-tender, non-distended Ext: no clubbing, cyanosis, or edema Psych: pleasant and cooperative  Skin: No evidence of breakdown, no evidence of rash Neuro: pleasantly confused, word finding deficits .tremor appears improved.  Decreased FMC,   Sensed pain in all 4's. No resting tone, DTR's 1+ Musculoskeletal: no limb or trunk pain   Assessment/Plan: 1. 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 Physiatrist is providing close team supervision and 24 hour management of active medical problems listed below. Physiatrist and rehab team continue to assess barriers to discharge/monitor patient progress toward functional and medical goals  Care Tool:  Bathing    Body parts bathed by patient: Right arm, Left arm, Chest, Abdomen, Front perineal area, Right  upper leg, Left upper leg, Face   Body parts bathed by helper: Buttocks, Right lower leg, Left lower leg     Bathing assist Assist Level: Minimal Assistance - Patient > 75%     Upper Body Dressing/Undressing Upper body dressing   What is the patient wearing?: Pull over shirt    Upper body assist Assist Level: Minimal Assistance - Patient > 75%    Lower Body Dressing/Undressing Lower body dressing      What is the patient wearing?: Pants     Lower body assist Assist for lower body dressing: Maximal Assistance - Patient 25 - 49%     Toileting Toileting    Toileting assist Assist for toileting: Moderate Assistance - Patient 50 - 74%     Transfers Chair/bed transfer  Transfers assist     Chair/bed transfer assist level: Minimal Assistance - Patient > 75%     Locomotion Ambulation   Ambulation assist      Assist level: Moderate Assistance - Patient 50 - 74% Assistive device: Hand held assist Max distance: 25'   Walk 10 feet activity   Assist     Assist level: Moderate Assistance - Patient - 50 - 74% Assistive device: Hand held assist  Walk 50 feet activity   Assist Walk 50 feet with 2 turns activity did not occur: Safety/medical concerns         Walk 150 feet activity   Assist Walk 150 feet activity did not occur: Safety/medical concerns         Walk 10 feet on uneven surface  activity   Assist     Assist level: Moderate Assistance - Patient - 50 - 74% Assistive device:  (R hand rail)   Wheelchair     Assist Is the patient using a wheelchair?: No             Wheelchair 50 feet with 2 turns activity    Assist            Wheelchair 150 feet activity     Assist          Blood pressure 129/67, pulse 71, temperature (!) 97.2 F (36.2 C), resp. rate 16, height 5\' 6"  (1.676 m), weight 104.2 kg, SpO2 97 %.  Medical Problem List and Plan: 1. 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  Antithrombotics: -DVT/anticoagulation:   none given IPH             -antiplatelet therapy: none 3. Pain Management: Tylenol PRN 4. Anxiety: Xanax .0.25 mg q HS PRN sleep, anxiety             -antipsychotic agents: seroquel  1/19 HS confusion/sundowning--improved with 12.5mg  seroquel at 1600 and 2100 daily  -ua neg, ucx neg   5. Neuropsych: This patient is not capable of making decisions on her own behalf. 6. Skin/Wound Care: Routine skin checks 7. Fluids/Electrolytes/Nutrition:    -intake fair--team encouraging her to take in more  -continue protein supplement 8. Hypertension:   --No home medications PTA Vitals:   06/15/21 0508 06/15/21 0800  BP: 121/70 129/67  Pulse: 66 71  Resp: 16   Temp: (!) 97.2 F (36.2 C)   SpO2: 97%   Controlled 1/20 on propranolol for tremor--continue same regimen  9. Hyperlipidemia: continue Crestor 10: DM-2: carb modified diet; Hgb A1c = 5.5.    -no cbg checks needed 11: Trigeminal neuralgia: continue Lamictal, duloxetine and Lyrica.  --Followed by [facility]. --seems controlled. Has been on these meds chronicaly 12. Essential tremor: on bid propranolol with some improvement  -appears improved--will conitnue current dosing   LOS: 8 days A FACE TO FACE EVALUATION WAS PERFORMED  Meredith Staggers 06/15/2021, 10:14 AM

## 2021-06-16 LAB — URINALYSIS, ROUTINE W REFLEX MICROSCOPIC
Bilirubin Urine: NEGATIVE
Glucose, UA: NEGATIVE mg/dL
Hgb urine dipstick: NEGATIVE
Ketones, ur: NEGATIVE mg/dL
Leukocytes,Ua: NEGATIVE
Nitrite: NEGATIVE
Protein, ur: NEGATIVE mg/dL
Specific Gravity, Urine: 1.018 (ref 1.005–1.030)
pH: 5 (ref 5.0–8.0)

## 2021-06-16 LAB — BASIC METABOLIC PANEL
Anion gap: 9 (ref 5–15)
BUN: 14 mg/dL (ref 8–23)
CO2: 26 mmol/L (ref 22–32)
Calcium: 9.3 mg/dL (ref 8.9–10.3)
Chloride: 102 mmol/L (ref 98–111)
Creatinine, Ser: 0.86 mg/dL (ref 0.44–1.00)
GFR, Estimated: >60 mL/min (ref >60)
Glucose, Bld: 116 mg/dL — ABNORMAL HIGH (ref 70–99)
Potassium: 4.1 mmol/L (ref 3.5–5.1)
Sodium: 137 mmol/L (ref 135–145)

## 2021-06-16 LAB — CBC WITH DIFFERENTIAL/PLATELET
Abs Immature Granulocytes: 0.02 10*3/uL (ref 0.00–0.07)
Basophils Absolute: 0 10*3/uL (ref 0.0–0.1)
Basophils Relative: 1 %
Eosinophils Absolute: 0.2 10*3/uL (ref 0.0–0.5)
Eosinophils Relative: 3 %
HCT: 40.2 % (ref 36.0–46.0)
Hemoglobin: 13.2 g/dL (ref 12.0–15.0)
Immature Granulocytes: 0 %
Lymphocytes Relative: 21 %
Lymphs Abs: 1.3 10*3/uL (ref 0.7–4.0)
MCH: 30.1 pg (ref 26.0–34.0)
MCHC: 32.8 g/dL (ref 30.0–36.0)
MCV: 91.8 fL (ref 80.0–100.0)
Monocytes Absolute: 0.5 10*3/uL (ref 0.1–1.0)
Monocytes Relative: 8 %
Neutro Abs: 4.3 10*3/uL (ref 1.7–7.7)
Neutrophils Relative %: 67 %
Platelets: 216 10*3/uL (ref 150–400)
RBC: 4.38 MIL/uL (ref 3.87–5.11)
RDW: 13 % (ref 11.5–15.5)
WBC: 6.5 10*3/uL (ref 4.0–10.5)
nRBC: 0 % (ref 0.0–0.2)

## 2021-06-16 MED ORDER — QUETIAPINE FUMARATE 25 MG PO TABS
25.0000 mg | ORAL_TABLET | Freq: Two times a day (BID) | ORAL | Status: DC
  Administered 2021-06-16 – 2021-06-21 (×11): 25 mg via ORAL
  Filled 2021-06-16 (×11): qty 1

## 2021-06-16 NOTE — Progress Notes (Signed)
PROGRESS NOTE   Subjective/Complaints: Husband notes more agitation than usual this AM She is pleasant with me but thinks she is in church, upset with husband for leaving her, taking off her SCDs and trying to get out of bed  ROS: Limited due to language/communication    Objective:   No results found. Recent Labs    06/14/21 0519 06/16/21 1507  WBC 6.1 6.5  HGB 12.2 13.2  HCT 37.3 40.2  PLT 200 216   Recent Labs    06/14/21 0519 06/16/21 1507  NA 135 137  K 3.7 4.1  CL 101 102  CO2 26 26  GLUCOSE 101* 116*  BUN 14 14  CREATININE 0.77 0.86  CALCIUM 8.7* 9.3    Intake/Output Summary (Last 24 hours) at 06/16/2021 1917 Last data filed at 06/16/2021 1831 Gross per 24 hour  Intake 852 ml  Output --  Net 852 ml        Physical Exam: Vital Signs Blood pressure 123/65, pulse 67, temperature 98.1 F (36.7 C), temperature source Oral, resp. rate 15, height 5\' 6"  (1.676 m), weight 104.2 kg, SpO2 96 %.  Gen: no distress, normal appearing HEENT: oral mucosa pink and moist, NCAT Cardio: Reg rate Chest: normal effort, normal rate of breathing Abd: soft, non-distended Ext: no edema Psych: pleasant, normal affect Skin: No evidence of breakdown, no evidence of rash Neuro: pleasantly confused, word finding deficits .tremor appears improved.  Decreased FMC,   Sensed pain in all 4's. No resting tone, DTR's 1+ Musculoskeletal: no limb or trunk pain   Assessment/Plan: 1. 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 Physiatrist is providing close team supervision and 24 hour management of active medical problems listed below. Physiatrist and rehab team continue to assess barriers to discharge/monitor patient progress toward functional and medical goals  Care Tool:  Bathing    Body parts bathed by  patient: Right arm, Left arm, Chest, Abdomen, Front perineal area, Right upper leg, Left upper leg, Face   Body parts bathed by helper: Buttocks, Right lower leg, Left lower leg     Bathing assist Assist Level: Minimal Assistance - Patient > 75%     Upper Body Dressing/Undressing Upper body dressing   What is the patient wearing?: Pull over shirt    Upper body assist Assist Level: Supervision/Verbal cueing    Lower Body Dressing/Undressing Lower body dressing      What is the patient wearing?: Pants     Lower body assist Assist for lower body dressing: Minimal Assistance - Patient > 75%     Toileting Toileting    Toileting assist Assist for toileting: Moderate Assistance - Patient 50 - 74%     Transfers Chair/bed transfer  Transfers assist     Chair/bed transfer assist level: Contact Guard/Touching assist     Locomotion Ambulation   Ambulation assist      Assist level: Moderate Assistance - Patient 50 - 74% Assistive device: Hand held assist Max distance: 25'   Walk 10 feet activity   Assist     Assist level: Moderate Assistance -  Patient - 50 - 74% Assistive device: Hand held assist   Walk 50 feet activity   Assist Walk 50 feet with 2 turns activity did not occur: Safety/medical concerns         Walk 150 feet activity   Assist Walk 150 feet activity did not occur: Safety/medical concerns         Walk 10 feet on uneven surface  activity   Assist     Assist level: Moderate Assistance - Patient - 50 - 74% Assistive device:  (R hand rail)   Wheelchair     Assist Is the patient using a wheelchair?: No             Wheelchair 50 feet with 2 turns activity    Assist            Wheelchair 150 feet activity     Assist          Blood pressure 123/65, pulse 67, temperature 98.1 F (36.7 C), temperature source Oral, resp. rate 15, height 5\' 6"  (1.676 m), weight 104.2 kg, SpO2 96 %.  Medical Problem List  and Plan: 1. 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  Antithrombotics: -DVT/anticoagulation:   none given IPH             -antiplatelet therapy: none 3. Pain Management: Tylenol PRN 4. Anxiety: Xanax .0.25 mg q HS PRN sleep, anxiety             -antipsychotic agents: seroquel  1/19 HS confusion/sundowning--improved with 12.5mg  seroquel at 1600 and 2100 daily. 1/21: increased agitation- increase seroquel to 31mBID. Labs ordered and stable.  -ua neg, ucx neg   5. Neuropsych: This patient is not capable of making decisions on her own behalf. 6. Skin/Wound Care: Routine skin checks 7. Fluids/Electrolytes/Nutrition:    -intake fair--team encouraging her to take in more  -continue protein supplement 8. Hypertension:   --No home medications PTA Vitals:   06/16/21 0940 06/16/21 1543  BP: 126/72 123/65  Pulse: 70 67  Resp:  15  Temp:  98.1 F (36.7 C)  SpO2:  96%  Controlled 1/21 on propranolol for tremor--continue same regimen  9. Hyperlipidemia: continue Crestor 10: DM-2: carb modified diet; Hgb A1c = 5.5.    -no cbg checks needed 11: Trigeminal neuralgia: continue Lamictal, duloxetine and Lyrica.  --Followed by [facility]. --seems controlled. Has been on these meds chronicaly 12. Essential tremor: on bid propranolol with some improvement  -appears improved--will conitnue current dosing   LOS: 9 days A FACE TO FACE EVALUATION WAS PERFORMED  Martha Clan P Abdullah Rizzi 06/16/2021, 7:17 PM

## 2021-06-16 NOTE — Progress Notes (Signed)
Speech Language Pathology Daily Session Note  Patient Details  Name: Shelby Hill MRN: [id number] Date of Birth: 1935/08/11  Today's Date: 06/16/2021 SLP Individual Time: 1030-1115 SLP Individual Time Calculation (min): 45 min  Short Term Goals: Week 2: SLP Short Term Goal 1 (Week 2): STGs=LTGs due to ELOS  Skilled Therapeutic Interventions: Pt seen for skilled ST with focus on cognitive communication goals, pt in bed and agreeable to therapeutic tasks. Pt initially calm, able to answer simple biographical and orientation questions with ~40% accuracy provided max A multimodal cues. Pt able to state "they say I had a stroke" when cued to scan environment for visual orientation aids. As session progressed, pt becoming more disoriented, confused and agitated. Pt requesting door to be open so her husband can find her, educated extensively pt is in the hospital and [name] has been to her room many times. Due to door being open, pt with significant external distractions exacerbating her confusion, max cues for redirection minimally effective. Pt began attempting to get out of bed thinking someone was waiting for her in the hallway, SLP facilitating phone call to pt husband to attempt to decreased paranoia, pt telling husband she is "in the back of the church" despite husbands attempts at orienting patient. Husband en route and to arrive in ~15 minutes at end of session, pt encouraged to rest in bed until he arrives. Max A cues to orient to call button for help and to change channels on TV. Pt left in bed with alarm set, locked in lowest position and telesitter in room. Cont ST POC.   Pain Pain Assessment Pain Scale: 0-10 Pain Score: 0-No pain  Therapy/Group: Individual Therapy  Dewaine Conger 06/16/2021, 11:25 AM

## 2021-06-17 LAB — URINE CULTURE: Culture: <10000 — AB

## 2021-06-17 NOTE — Progress Notes (Signed)
Speech Language Pathology Daily Session Note  Patient Details  Name: Shelby Hill MRN: [id number] Date of Birth: [birth year]  Today's Date: 06/17/2021 SLP Individual Time: 1105-1145 SLP Individual Time Calculation (min): 40 min  Short Term Goals: Week 2: SLP Short Term Goal 1 (Week 2): STGs=LTGs due to ELOS  Skilled Therapeutic Interventions: Pt was seen for skilled ST targeting goals for communication.   Pt was sleeping upon therapist's arrival but awakened easily to voice and was agreeable to participating in treatment.  Pt followed commands while repositioning herself in bed with min assist multimodal cues.  SLP facilitated the session with structured tasks which accessed visual modalities in order to improve awareness of errors/communication breakdown.  When completing a total of 20 phrase completion tasks, pt was able to correct 3 out of 3 errors with min assist verbal cues.  Pt was 100% accurate when matching a word to a targeted object as well as when matching a descriptive phrase to a targeted object.  During functional conversations with SLP, pt needed overall mod verbal cues for redirection to topic due to verbosity.  Pt was left in bed with bilateral wrist restraints reapplied, bed alarm set, and call bell within reach.  Continue per current plan of care.    Pain Pain Assessment Pain Scale: 0-10 Pain Score: 0-No pain  Therapy/Group: Individual Therapy  Shelby Hill, Shelby Hill 06/17/2021, 12:49 PM

## 2021-06-17 NOTE — Plan of Care (Signed)
°  Problem: Consults Goal: RH STROKE PATIENT EDUCATION Description: See Patient Education module for education specifics  Outcome: Progressing   Problem: RH BOWEL ELIMINATION Goal: RH STG MANAGE BOWEL WITH ASSISTANCE Description: STG Manage Bowel with Supervision Assistance. Outcome: Progressing Goal: RH STG MANAGE BOWEL W/MEDICATION W/ASSISTANCE Description: STG Manage Bowel with Medication with Supervision Assistance. Outcome: Progressing   Problem: RH BLADDER ELIMINATION Goal: RH STG MANAGE BLADDER WITH ASSISTANCE Description: STG Manage Bladder With Supervision Assistance Outcome: Progressing Goal: RH STG MANAGE BLADDER WITH MEDICATION WITH ASSISTANCE Description: STG Manage Bladder With Medication With Supervision Assistance. Outcome: Progressing   Problem: RH SKIN INTEGRITY Goal: RH STG MAINTAIN SKIN INTEGRITY WITH ASSISTANCE Description: STG Maintain Skin Integrity With Supervision Assistance. Outcome: Progressing Goal: RH STG ABLE TO PERFORM INCISION/WOUND CARE W/ASSISTANCE Description: STG Able To Perform Peri Care With Supervision Assistance. Outcome: Progressing   Problem: RH SAFETY Goal: RH STG ADHERE TO SAFETY PRECAUTIONS W/ASSISTANCE/DEVICE Description: STG Adhere to Safety Precautions With Cues and Reminders. Outcome: Progressing Goal: RH STG DECREASED RISK OF FALL WITH ASSISTANCE Description: STG Decreased Risk of Fall With Supervision Assistance. Outcome: Progressing   Problem: RH COGNITION-NURSING Goal: RH STG ANTICIPATES NEEDS/CALLS FOR ASSIST W/ASSIST/CUES Description: STG Anticipates Needs/Calls for Assist With Cues and Reminders. Outcome: Progressing   Problem: RH KNOWLEDGE DEFICIT Goal: RH STG INCREASE KNOWLEDGE OF HYPERTENSION Description: Patient will demonstrate knowledge of HTN medications, dietary recommendations, and BP management with educational materials and handouts provided by staff independently at discharge. Outcome: Progressing Goal:  RH STG INCREASE KNOWLEGDE OF HYPERLIPIDEMIA Description: Patient will demonstrate knowledge of HLD medications, dietary recommendations, and HLD management with educational materials and handouts provided by staff independently at discharge.  Outcome: Progressing   Problem: Safety: Goal: Non-violent Restraint(s) Outcome: Progressing

## 2021-06-18 ENCOUNTER — Inpatient Hospital Stay (HOSPITAL_COMMUNITY): Payer: Medicare PPO

## 2021-06-18 DIAGNOSIS — F05 Delirium due to known physiological condition: Secondary | ICD-10-CM

## 2021-06-18 NOTE — Progress Notes (Signed)
Pt removed both soft wrist restraints while under supervision of tele sitter. Bed alarm activated as pt scooted towards end of bed. Nursing staff helped her back into bed and reapplied restraints. Pt was confused but co-operative. Toileting offered but pt declined. No other requests at this time. Bed alarm reset, bed in lowest setting, call light in reach. Charge nurse notified about incident.

## 2021-06-18 NOTE — Progress Notes (Signed)
Occupational Therapy Session Note  Patient Details  Name: Shelby Hill MRN: [id number] Date of Birth: 03/28/1936  Today's Date: 06/18/2021 OT Individual Time: 7579-7282 OT Individual Time Calculation (min): 30 min    Short Term Goals: Week 1:  OT Short Term Goal 1 (Week 1): Patient will complete LB dressing with min A OT Short Term Goal 1 - Progress (Week 1): Met OT Short Term Goal 2 (Week 1): Patient will maintain standing balance at the sink with CGA in preparation for BADL task OT Short Term Goal 2 - Progress (Week 1): Met OT Short Term Goal 3 (Week 1): Patient will orient shirt with min questioning cues. OT Short Term Goal 3 - Progress (Week 1): Progressing toward goal  Skilled Therapeutic Interventions/Progress Updates:    Pt sitting up in w/c, trying to push buttons on call bell remote control stating she was trying to figure out how to call her husband.  Reoriented pt to purpose of call bell/remote and redirected pt to her room telephone.  Pt reports she cant remember her husbands phone number therefore OT retrieved from pts facesheet.  Initially provided pt with verbal cues and visual cues and step by step instruction in order to dial her husbands phone number, however despite cueing, pt repeatedly pressing incorrect buttons, therefore needing total assist to complete call.  Pt spoke to husband who informed her that he was checking in and then entered room soon after.  Pt seeming with less anxiety but still reporting she doesn't know why she is here.  Pt gently reoriented pt to place, situation, and OT goals.  When husband arrived, he supported pt by also gently re-orienting her to current situation.  Pt continued to demonstrate language of confusion but appearing less anxious overall.  Lunch tray arrived and pt setup with all needs. Call bell in reach, seat alarm on.  Therapy Documentation Precautions:  Precautions Precautions: Fall (Simultaneous filing. User may not have seen  previous data.) Precaution Comments: R knee problems recently Restrictions Weight Bearing Restrictions: No     Therapy/Group: Individual Therapy  Ezekiel Slocumb 06/18/2021, 2:06 PM

## 2021-06-18 NOTE — Progress Notes (Signed)
Speech Language Pathology Daily Session Note  Patient Details  Name: Shelby Hill MRN: [id number] Date of Birth: 1935-12-06  Today's Date: 06/18/2021 SLP Individual Time: 1030-1130 SLP Individual Time Calculation (min): 60 min  Short Term Goals: Week 2: SLP Short Term Goal 1 (Week 2): STGs=LTGs due to ELOS  Skilled Therapeutic Interventions: Skilled treatment session focused on cognitive-linguistic goals. Upon arrival, patient was awake while upright in the wheelchair and appeared mildly confused. With passive orientation and encouragement, patient agreeable to participate in treatment session. SLP facilitated session by providing written orientation questions. Patient was able to read the questions aloud with supervision verbal cues to self-monitor and correct errors with Max verbal cues needed for recall of orientation information. Mod verbal cues were also needed to self-monitor and correct written errors. Patient also participated in a generative naming task and was able to generate 3 items within specific categories with Min verbal cues with increased awareness of spelling errors noted. Overall, patient calm and cooperative with decreased verbosity and jargon. Patient left upright in wheelchair with alarm on and all needs within reach. Continue with current plan of care.      Pain No/Denies Pain   Therapy/Group: Individual Therapy  [name] 06/18/2021, 12:35 PM

## 2021-06-18 NOTE — Progress Notes (Signed)
Occupational Therapy Session Note  Patient Details  Name: Shelby Hill MRN: [id number] Date of Birth: Oct 26, 1935  Today's Date: 06/18/2021 OT Individual Time: 5916-3846 OT Individual Time Calculation (min): 71 min    Short Term Goals: Week 2:  OT Short Term Goal 1 (Week 2): LTG=STG 2/2 ELOS  Skilled Therapeutic Interventions/Progress Updates:    Pt greeted semi-reclined in bed with wrist restraints on and agreeable to OT treatment session. Pt completed bed mobility with supervision and minimal cues. Pt confused and kept saying "I just don't know what i'm doing." OT reoriented pt to place, situation, and OT goals.  Pt continued to demonstrate  language of confusion and difficulty expressing herself at times, but she did have a some  sentences that came out fluid with a clear thought process. Pt incontinent of bladder and unaware, requiring OT encouragement to get to the bathroom. Once pt saw she had been incontinent, she was agreeable to shower, but not wash her hair. Pt was led into shower with RW and CGA + verbal cues for safety and sequencing. Pt needed CGA for bathing mostly for balance when standing, but needed moderate cues to wash all body parts thoroughly as she would forget what she had or had not washed. Dressing tasks completed seated EOB with improved sequencing and initiation within familiar dressing task. Pt ambulated to the sink with RW and CGA. Pt needed moderate cues to initiate combing hair and moderate cues to locate items at the sink. Pt perseverating on finding hair spray. Pt ambulated to the therapy gym with RW, CGA and verbal and tactile cues to follow directions to turn L or R. Used BITS to work on sustained attention to task and visual scanning. In quiet environment, pt able to increase reaction time for 6 seconds to 3 seconds with repetition. Progressed to 1-10 number sequence with pt able to complete with only minimal cues. OT then changed to letters A-J but environment became  much more distracting and pt needed MAX multimodal cues to initiate and sequence letters A-J. Pt ambulated back to room with RW and CGA nad left seated in wc with alarm belt on, telemonitor on, call bell in reach, and needs met.    Therapy Documentation Precautions:  Precautions Precautions: Fall (Simultaneous filing. User may not have seen previous data.) Precaution Comments: R knee problems recently Restrictions Weight Bearing Restrictions: No Pain: Pain Assessment Pain Scale: 0-10 Pain Score: 0-No pain   Therapy/Group: Individual Therapy  Valma Cava 06/18/2021, 9:24 AM

## 2021-06-18 NOTE — Progress Notes (Addendum)
PROGRESS NOTE   Subjective/Complaints: Ongoing agitation reported at times. Has been sleeping better at nights. Confused this morning.   ROS: Limited due to cognitive/behavioral     Objective:   No results found. Recent Labs    06/16/21 1507  WBC 6.5  HGB 13.2  HCT 40.2  PLT 216   Recent Labs    06/16/21 1507  NA 137  K 4.1  CL 102  CO2 26  GLUCOSE 116*  BUN 14  CREATININE 0.86  CALCIUM 9.3    Intake/Output Summary (Last 24 hours) at 06/18/2021 1057 Last data filed at 06/18/2021 0810 Gross per 24 hour  Intake 297 ml  Output --  Net 297 ml        Physical Exam: Vital Signs Blood pressure (!) 143/68, pulse 62, temperature 97.8 F (36.6 C), temperature source Oral, resp. rate 18, height 5\' 6"  (1.676 m), weight 104.2 kg, SpO2 99 %.  Constitutional: No distress . Vital signs reviewed. HEENT: NCAT, EOMI, oral membranes moist Neck: supple Cardiovascular: RRR without murmur. No JVD    Respiratory/Chest: CTA Bilaterally without wheezes or rales. Normal effort    GI/Abdomen: BS +, non-tender, non-distended Ext: no clubbing, cyanosis, or edema Psych: pleasant  but confused  Skin: No evidence of breakdown, no evidence of rash Neuro: pleasantly confused, word finding deficits .tremor under control. Decreased FMC,   Sensed pain in all 4's. No resting tone, DTR's 1+ Musculoskeletal: no limb or trunk pain   Assessment/Plan: 1. 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 Physiatrist is providing close team supervision and 24 hour management of active medical problems listed below. Physiatrist and rehab team continue to assess barriers to discharge/monitor patient progress toward functional and medical goals  Care Tool:  Bathing    Body parts bathed by patient: Right arm, Left arm, Chest, Abdomen,  Front perineal area, Right upper leg, Left upper leg, Face   Body parts bathed by helper: Buttocks, Right lower leg, Left lower leg     Bathing assist Assist Level: Minimal Assistance - Patient > 75%     Upper Body Dressing/Undressing Upper body dressing   What is the patient wearing?: Pull over shirt    Upper body assist Assist Level: Supervision/Verbal cueing    Lower Body Dressing/Undressing Lower body dressing      What is the patient wearing?: Pants     Lower body assist Assist for lower body dressing: Minimal Assistance - Patient > 75%     Toileting Toileting    Toileting assist Assist for toileting: Moderate Assistance - Patient 50 - 74%     Transfers Chair/bed transfer  Transfers assist     Chair/bed transfer assist level: Contact Guard/Touching assist     Locomotion Ambulation   Ambulation assist      Assist level: Moderate Assistance - Patient 50 - 74% Assistive device: Hand held assist Max distance: 25'   Walk 10 feet activity   Assist     Assist level: Moderate Assistance - Patient - 50 - 74% Assistive device: Hand held assist   Walk 50 feet  activity   Assist Walk 50 feet with 2 turns activity did not occur: Safety/medical concerns         Walk 150 feet activity   Assist Walk 150 feet activity did not occur: Safety/medical concerns         Walk 10 feet on uneven surface  activity   Assist     Assist level: Moderate Assistance - Patient - 50 - 74% Assistive device:  (R hand rail)   Wheelchair     Assist Is the patient using a wheelchair?: No             Wheelchair 50 feet with 2 turns activity    Assist            Wheelchair 150 feet activity     Assist          Blood pressure (!) 143/68, pulse 62, temperature 97.8 F (36.6 C), temperature source Oral, resp. rate 18, height 5\' 6"  (1.676 m), weight 104.2 kg, SpO2 99 %.  Medical Problem List and Plan: 1. 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 Teas's condition, ELOS, prognosis, expectations, etc.  2.  Antithrombotics: -DVT/anticoagulation:   none given IPH             -antiplatelet therapy: none 3. Pain Management: Tylenol PRN 4. Anxiety/sun-downing: Xanax .0.25 mg q HS PRN sleep, anxiety             -antipsychotic agents: seroquel  1/23 increased agitation over weekend, but slow cognitive progress   -sorbitol increased to 25mg  at 4pm and 9pm   -labs, urine all within normal limits   -will check CT of head today to assess blood/associated edema   -I do think she'll improve in a familiar environment after d/c 5. Neuropsych: This patient is not capable of making decisions on her own behalf. 6. Skin/Wound Care: Routine skin checks 7. Fluids/Electrolytes/Nutrition:    -intake fair--continue to encourage  -continue protein supplement 8. Hypertension:   --No home medications PTA Vitals:   06/17/21 1931 06/18/21 0537  BP: 133/76 (!) 143/68  Pulse: 70 62  Resp: 16 18  Temp: 99.1 F (37.3 C) 97.8 F (36.6 C)  SpO2: 96% 99%  Controlled 1/23 on propranolol for tremor--continue same regimen  9. Hyperlipidemia: continue Crestor 10: DM-2: carb modified diet; Hgb A1c = 5.5.    -no cbg checks needed 11: Trigeminal neuralgia: continue Lamictal, duloxetine and Lyrica.  --Followed by [facility]. --seems controlled. Has been on these meds chronicaly 12. Essential tremor: on bid propranolol with some improvement  -appears improved--will conitnue current dosing  A total of 60 minutes was spent on this patient this morning. Including counseling for family, review of patient chart, images, med list, examination, etc.    LOS: 11 days A FACE TO FACE EVALUATION WAS  PERFORMED  Meredith Staggers 06/18/2021, 10:57 AM

## 2021-06-18 NOTE — Progress Notes (Signed)
Physical Therapy Session Note  Patient Details  Name: Shelby Hill MRN: [id number] Date of Birth: 07-27-1935  Today's Date: 06/18/2021 PT Individual Time: 2707-8675 PT Individual Time Calculation (min): 54 min   Short Term Goals: Week 2:  PT Short Term Goal 1 (Week 2): STGs = LTGs  Skilled Therapeutic Interventions/Progress Updates:     Pt received seated in WC and agrees to therapy. No complaint of pain. Wc transport to gym for time management. Pt performs sit to stand with CGA and cues for hand placement and body mechanics. Pt ambulates x420' with SPC in R hand and CGA, with cues for navgiation and avoiding obstacle in crowded environment, as well as to direct pt to center of hallway due to pt tending to veer too closely to wall and other objects on the R. Pt performs NMR for standing balance with cognitive overlay, tasked with targeted toe tapping on numbered circles. PT provides laterality and number and pt follows command. Pt requires min to mod cueing to remain on task but is able to follow 1 step instructions consistently, with PT providing CGA for safety and cues for foot placement for optimal stability. Following seated rest break, pt performs again with 2-step commands, and performs accurately ~75% of time, with increased processing time required. Pt ambulates x300' with SPC and CGA, and requires seated rest break due to fatigue, prior to ambulating 100' back to [facility]. Pt left seated in WC with alarm intact and all needs within reach.  Therapy Documentation Precautions:  Precautions Precautions: Fall (Simultaneous filing. User may not have seen previous data.) Precaution Comments: R knee problems recently Restrictions Weight Bearing Restrictions: No   Therapy/Group: Individual Therapy  Breck Coons, PT, DPT 06/18/2021, 3:35 PM

## 2021-06-19 ENCOUNTER — Inpatient Hospital Stay (HOSPITAL_COMMUNITY): Payer: Medicare PPO

## 2021-06-19 IMAGING — CT CT HEAD W/O CM
3 of 4 series · 15 of 47 positions shown, 18 images · non-contrast
Comparison: Brain MRI [DATE], CT head [DATE]

CLINICAL DATA: Altered mental status



[Series 3: head 5.0 h30s · axial · 0.44mm/px · z∈[-95,+45]mm · 9 of 34 slices shown, 12 images]
[im 3/34  brain]
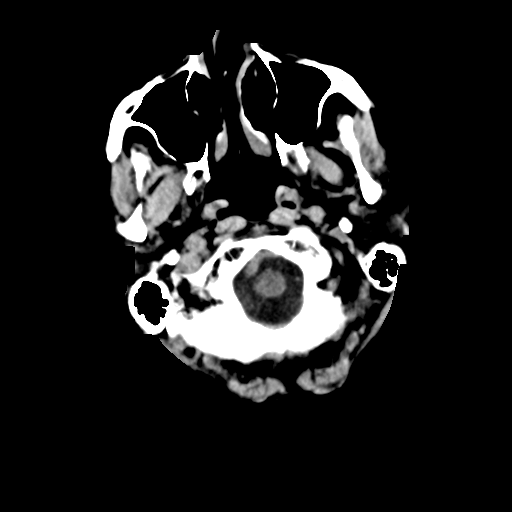
[im 3/34  bone]
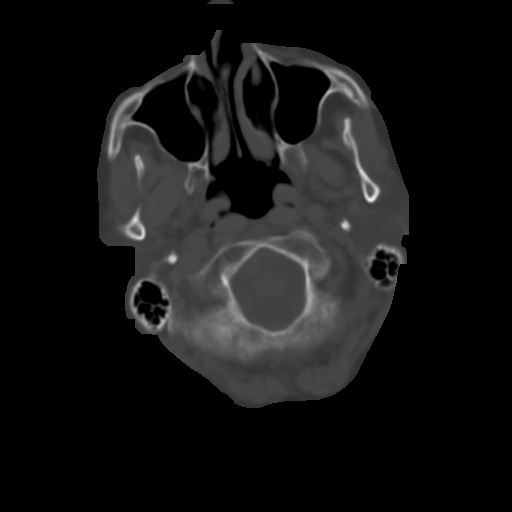
[im 8/34  brain]
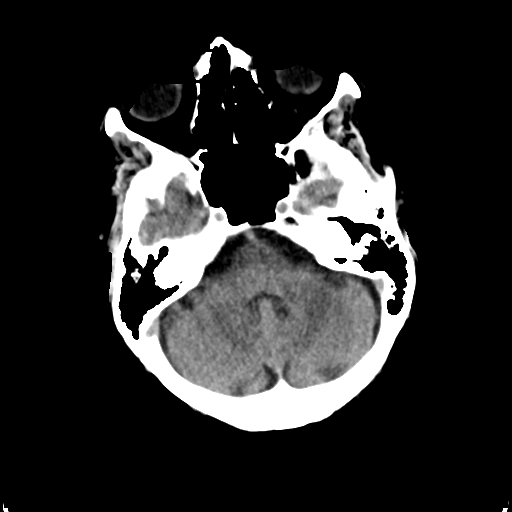
[im 10/34  brain]
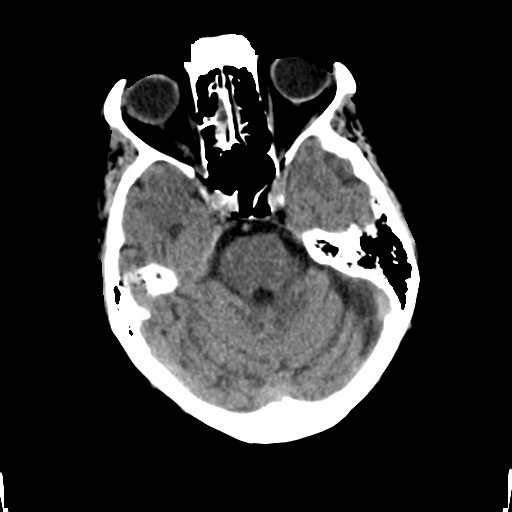
[im 15/34  brain]
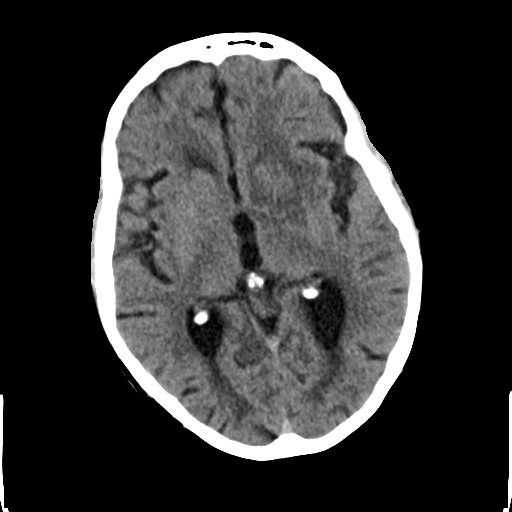
[im 17/34  brain]
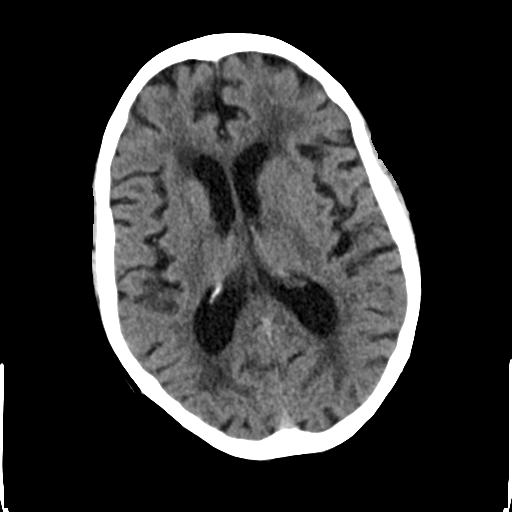
[im 17/34  bone]
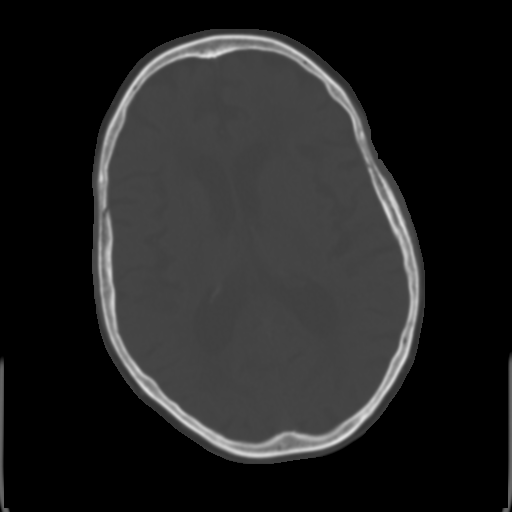
[im 19/34  brain]
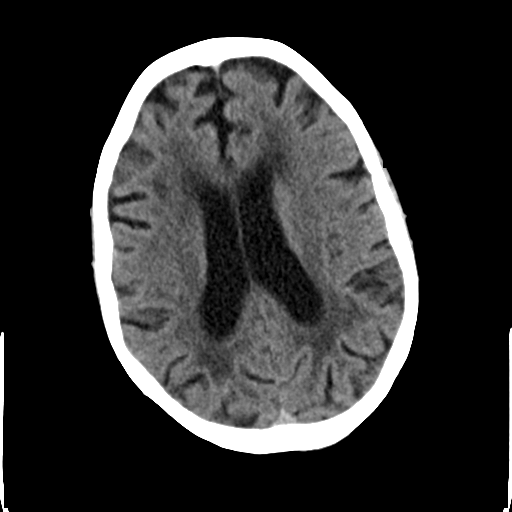
[im 24/34  brain]
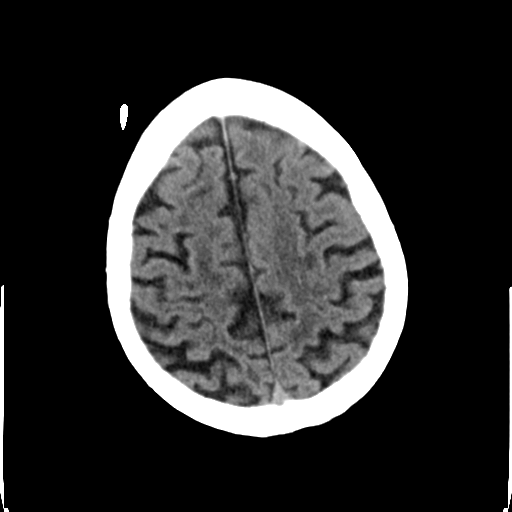
[im 26/34  brain]
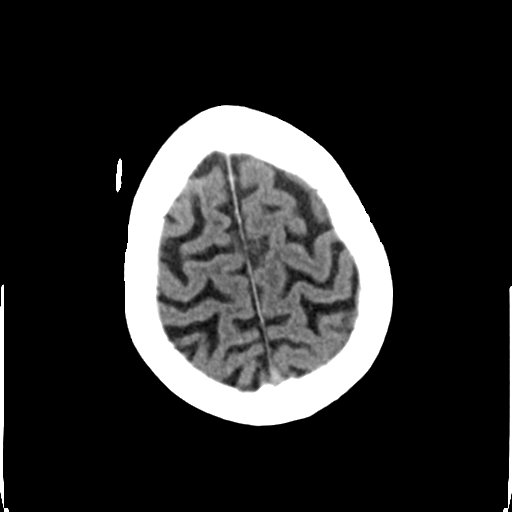
[im 31/34  brain]
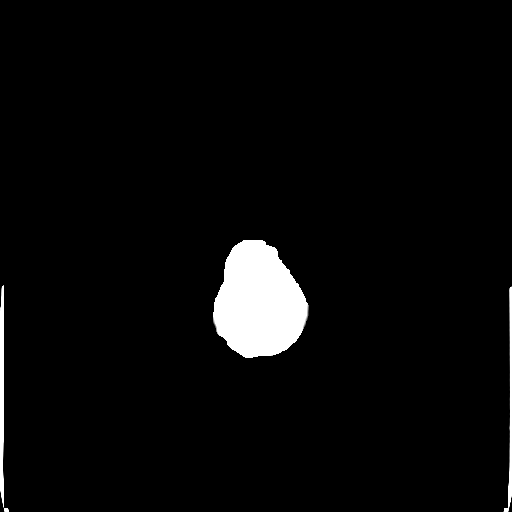
[im 31/34  bone]
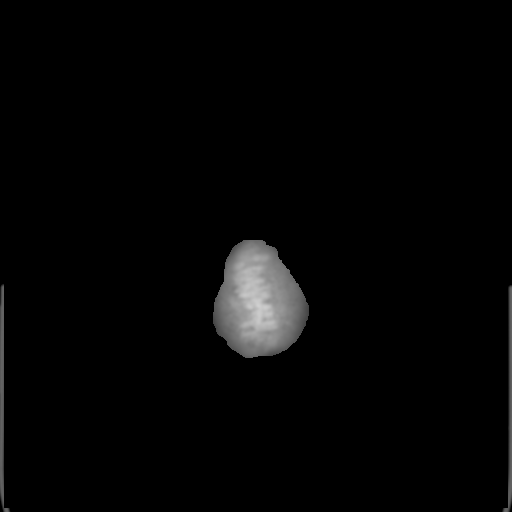

[Series 5: head 3.0 mpr cor · coronal · 0.33mm/px · 3 of 67 slices shown]
[im 23/67  brain]
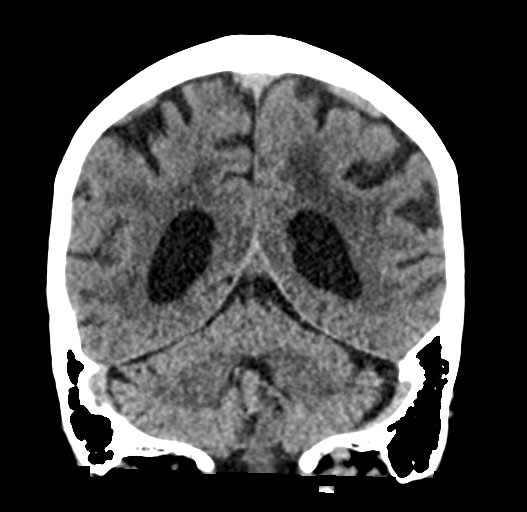
[im 30/67  brain]
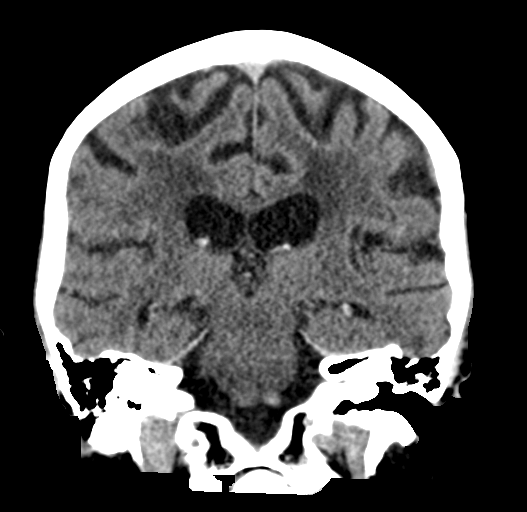
[im 37/67  brain]
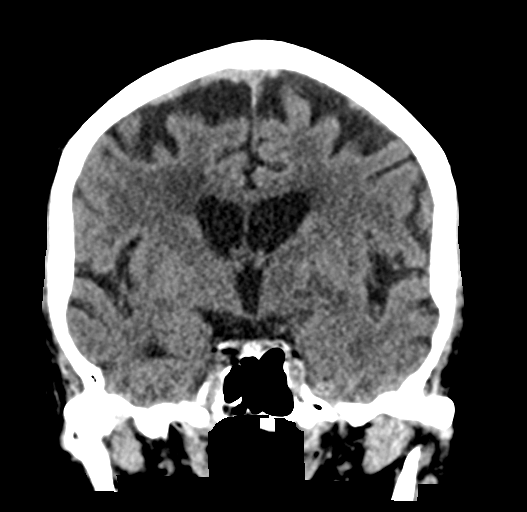

[Series 6: head 3.0 mpr sag · sagittal · 0.32mm/px · 3 of 62 slices shown]
[im 21/62  brain]
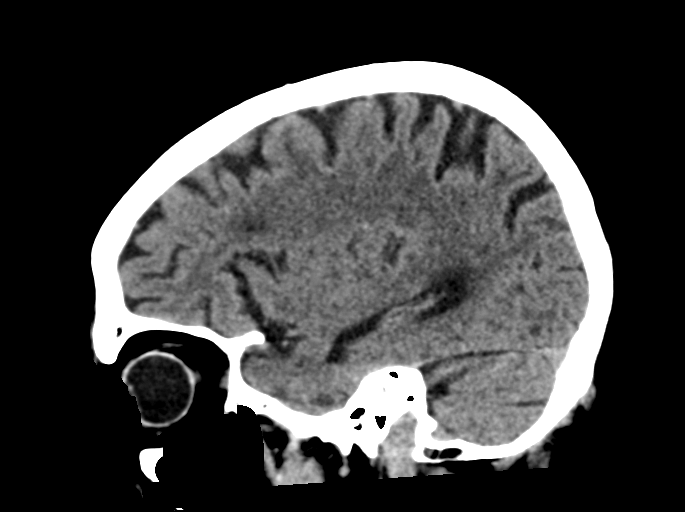
[im 31/62  brain]
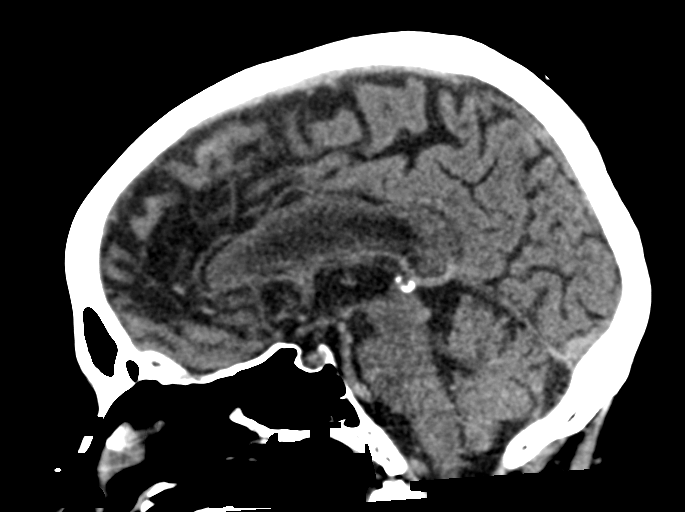
[im 41/62  brain]
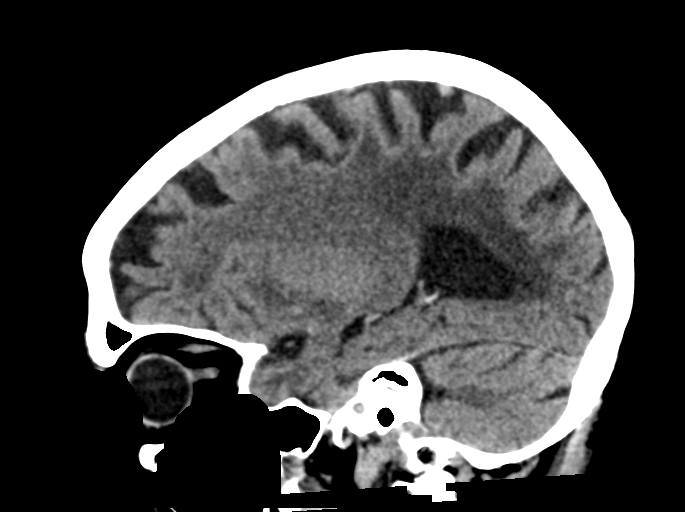

[15 of 47 positions shown; findings below may reference images not displayed]

FINDINGS: Brain: Has been interval expected evolution of the intraparenchymal
hematoma centered in the left caudate head with overall decreased
size and density and decreased surrounding edema. Previously seen
intraventricular blood has resolved. There is an re-expansion of the
left frontal horn.

There is no new intracranial hemorrhage or extra-axial fluid
collection. There is no evidence of acute infarct.

Background global parenchymal volume loss and chronic white matter
microangiopathy is unchanged. There is no mass lesion. There is no
midline shift.

Vascular: No hyperdense vessel or unexpected calcification.

Skull: Normal. Negative for fracture or focal lesion.

Sinuses/Orbits: The paranasal sinuses are clear. Bilateral lens
implants are in place. The globes and orbits are otherwise
unremarkable.

Other: None.
IMPRESSION: 1. Expected evolution of the intraparenchymal hematoma centered in
the left caudate head with overall decreased size and density, and
decreased surrounding edema with re-expansion of the left frontal
horn. Previously seen intraventricular blood products have resolved.
2. No new intracranial hemorrhage, extra-axial fluid collection, or
infarct.

## 2021-06-19 MED ORDER — LORAZEPAM 0.5 MG PO TABS
0.5000 mg | ORAL_TABLET | Freq: Once | ORAL | Status: AC
  Administered 2021-06-19: 15:00:00 0.5 mg via ORAL
  Filled 2021-06-19: qty 1

## 2021-06-19 NOTE — Progress Notes (Signed)
Speech Language Pathology Daily Session Note  Patient Details  Name: Shelby Hill MRN: [id number] Date of Birth: 1935/09/17  Today's Date: 06/19/2021 SLP Individual Time: 1345-1425 SLP Individual Time Calculation (min): 40 min  Short Term Goals: Week 2: SLP Short Term Goal 1 (Week 2): STGs=LTGs due to ELOS  Skilled Therapeutic Interventions: Skilled treatment session focused on cognitive-linguistic goals. Upon arrival, patient was awake in the wheelchair and perseverative on having her hair in her face. SLP provided patient with a clip to minimize distractions. Patient with intermittent language of confusion but completed responsive naming tasks with 100% accuracy. Patient also answered complex yes/no questions with 60% accuracy but demonstrated appropriate awareness regarding how questions were "confusing." Patient also reported that she is aware that her confusion would limit her from doing certain tasks at home like "driving."  Patient also demonstrated increased awareness of word-finding deficits which led to increased frustration during structured language tasks. Patient left upright in wheelchair with alarm on and all needs within reach. Continue with current plan of care.      Pain No/Denies Pain   Therapy/Group: Individual Therapy  Mendel Binsfeld 06/19/2021, 3:08 PM

## 2021-06-19 NOTE — Progress Notes (Signed)
Occupational Therapy Session Note  Patient Details  Name: Shelby Hill MRN: [id number] Date of Birth: 24-Nov-1935  Today's Date: 06/19/2021 OT Individual Time: 3295-1884 OT Individual Time Calculation (min): 58 min   OT Individual Time: 1300-1345 OT Individual Time Calculation (min): 45 min    Short Term Goals: Week 1:  OT Short Term Goal 1 (Week 1): Patient will complete LB dressing with min A OT Short Term Goal 1 - Progress (Week 1): Met OT Short Term Goal 2 (Week 1): Patient will maintain standing balance at the sink with CGA in preparation for BADL task OT Short Term Goal 2 - Progress (Week 1): Met OT Short Term Goal 3 (Week 1): Patient will orient shirt with min questioning cues. OT Short Term Goal 3 - Progress (Week 1): Progressing toward goal Week 2:  OT Short Term Goal 1 (Week 2): LTG=STG 2/2 ELOS  Skilled Therapeutic Interventions/Progress Updates:  Session 1: Patient met lying in bed in agreement with OT treatment session. 0/10 pain reported at rest and with activity. Bilateral wrist restraints removed in prep for OT treatment session. Patient denied need for toileting but with encouragement completed toilet transfer. Patient able to void bladder. CGA to supervision A for 3/3 parts of toieting task. Patient then completed 3/3 grooming tasks standing at sink level with Min question cues for location of ADL items and for sequencing. Functional mobility to and from dayroom with SPC and CGA. Focus then shifted to functional cognition, sustained attention, STM and processing with meal planning task. Patient selected 2 days of the week then decided on 1 meal for each day ultimately choosing to meal plan for breakfast on Tuesday and lunch on Friday. Patient required increased time to come up with food items she would usually consume at breakfast/lunch time requiring heavy question cues. Patient did however select appropriate items for both meals. Patient then tasked with creating a grocery  list based on meals planned. Patient required Mod-Max question cues for this aspect of the task and redirection secondary to occasionally tangential speech. Overall patient required Mod A to complete task. Session concluded with patient seated in wc with call bell within reach, belt alarm activated and all needs met. Repeat education provided on pushing call bell for all needs.   Session 2: Patient met seated in wc in agreement with OT treatment session. 0/10 pain reported at rest and with activity. Spouse and daughter present at bedside. Patient incontinent of bladder so toileting offered. Patient competed toilet transfer and 3/3 parts of toileting task with CGA. Would benefit from timed toileting. Patient then completed hand hygiene standing at sink level. In moderately distracting environment (family present and LCSW present for update after conference) patient required Mod cues for sequencing. Patient also indicating desire to "fix hair". Offered hair washing with patient in agreement. Total A to wash hair seated in wc at sink level. Patient then able to blow dry and style hair with supervision A seated at sink level. Session concluded with patient seated in wc with call bell within reach, belt alarm activated and all needs met. Family still present at bedside.   Therapy Documentation Precautions:  Precautions Precautions: Fall (Simultaneous filing. User may not have seen previous data.) Precaution Comments: R knee problems recently Restrictions Weight Bearing Restrictions: No General:    Therapy/Group: Individual Therapy  Wynter Grave R Howerton-Davis 06/19/2021, 7:03 AM

## 2021-06-19 NOTE — Progress Notes (Signed)
Physical Therapy Session Note  Patient Details  Name: Shelby Hill MRN: [id number] Date of Birth: December 08, 1935  Today's Date: 06/19/2021 PT Individual Time: 0932-1028 PT Individual Time Calculation (min): 56 min   Short Term Goals: Week 2:  PT Short Term Goal 1 (Week 2): STGs = LTGs  Skilled Therapeutic Interventions/Progress Updates:     Pt received seated in [facility] and agrees to therapy. No complaint of pain. Pt asked orientation questions and requires max cueing for situation and location. WC transport to gym for time management and energy conservation. Pt performs stand step transfer to Nustep with CGA and cues for hand placement and positioning. Pt completes nustep to work on strength and endurance training as well as challenging pt's attention to task, asked to "Maintain speed between 40 and 50 steps per minute." Pt completes for 15:00 at workload of 5 and generally does a good job of maintaining steps per minute, only requiring occasional cueing to increase rate.  Pt completes 6 minute walk test with SPC and CGA from PT, with total distance of 675'. Pt educated on importance of only ambulating when family is present following DC, due to high risk for falls.  Pt left seated in WC with alarm intact and all needs within reach.  Therapy Documentation Precautions:  Precautions Precautions: Fall (Simultaneous filing. User may not have seen previous data.) Precaution Comments: R knee problems recently Restrictions Weight Bearing Restrictions: No    Therapy/Group: Individual Therapy  Breck Coons, PT, DPT 06/19/2021, 2:35 PM

## 2021-06-19 NOTE — Progress Notes (Signed)
Patient ID: Shelby Hill, female   DOB: Nov 27, 1935, 87 y.o.   MRN: [id number]  SW met with pt, pt husband, and pt dtr [name] in room to provide updates from team conference, d/c date remains 1/57, and confirmed [facility]HA preference is [facility]. Reports they also have hired night time care to be in the home. SW informed will provide updates once aware on DME. Fam edu on Thursday (1/26) 8am-12pm with pt husband, dtr, and possibly one of her sons.   SW sent HHPT/OT/SLP/aide referral to [facility].   Loralee Pacas, MSW, [place] Office: [phone] Cell: [phone] Fax: [phone]

## 2021-06-19 NOTE — Progress Notes (Signed)
CT scan reviewed:  IMPRESSION: 1. Expected evolution of the intraparenchymal hematoma centered in the left caudate head with overall decreased size and density, and decreased surrounding edema with re-expansion of the left frontal horn. Previously seen intraventricular blood products have resolved. 2. No new intracranial hemorrhage, extra-axial fluid collection, or infarct.     Electronically Signed   By: Valetta Mole M.D.   On: 06/19/2021 16:37

## 2021-06-19 NOTE — Progress Notes (Addendum)
PROGRESS NOTE   Subjective/Complaints: Seems to have had a better night. Calm this morning.   ROS: Limited due to cognitive/behavioral/language   Objective:   No results found. Recent Labs    06/16/21 1507  WBC 6.5  HGB 13.2  HCT 40.2  PLT 216   Recent Labs    06/16/21 1507  NA 137  K 4.1  CL 102  CO2 26  GLUCOSE 116*  BUN 14  CREATININE 0.86  CALCIUM 9.3    Intake/Output Summary (Last 24 hours) at 06/19/2021 0906 Last data filed at 06/19/2021 0700 Gross per 24 hour  Intake 360 ml  Output --  Net 360 ml        Physical Exam: Vital Signs Blood pressure (!) 142/63, pulse 68, temperature 97.7 F (36.5 C), resp. rate 16, height 5\' 6"  (1.676 m), weight 104.2 kg, SpO2 96 %.  Constitutional: No distress . Vital signs reviewed. HEENT: NCAT, EOMI, oral membranes moist Neck: supple Cardiovascular: RRR without murmur. No JVD    Respiratory/Chest: CTA Bilaterally without wheezes or rales. Normal effort    GI/Abdomen: BS +, non-tender, non-distended Ext: no clubbing, cyanosis, or edema Psych: pleasant and cooperative   Skin: No evidence of breakdown, no evidence of rash Neuro: fluid aphasia, confused. Does attend to me fairly well.tremor under control although still present at times. Decreased FMC,   Sensed pain in all 4's. No resting tone, DTR's 1+ Musculoskeletal: no limb or trunk pain   Assessment/Plan: 1. 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 Physiatrist is providing close team supervision and 24 hour management of active medical problems listed below. Physiatrist and rehab team continue to assess barriers to discharge/monitor patient progress toward functional and medical goals  Care Tool:  Bathing    Body parts bathed by patient: Right arm, Left arm, Chest, Abdomen, Front perineal  area, Right upper leg, Left upper leg, Face   Body parts bathed by helper: Buttocks, Right lower leg, Left lower leg     Bathing assist Assist Level: Minimal Assistance - Patient > 75%     Upper Body Dressing/Undressing Upper body dressing   What is the patient wearing?: Pull over shirt    Upper body assist Assist Level: Supervision/Verbal cueing    Lower Body Dressing/Undressing Lower body dressing      What is the patient wearing?: Pants     Lower body assist Assist for lower body dressing: Minimal Assistance - Patient > 75%     Toileting Toileting    Toileting assist Assist for toileting: Moderate Assistance - Patient 50 - 74%     Transfers Chair/bed transfer  Transfers assist     Chair/bed transfer assist level: Contact Guard/Touching assist     Locomotion Ambulation   Ambulation assist      Assist level: Moderate Assistance - Patient 50 - 74% Assistive device: Hand held assist Max distance: 25'   Walk 10 feet activity   Assist     Assist level: Moderate Assistance - Patient - 50 - 74% Assistive device: Hand held assist   Walk 50 feet activity  Assist Walk 50 feet with 2 turns activity did not occur: Safety/medical concerns         Walk 150 feet activity   Assist Walk 150 feet activity did not occur: Safety/medical concerns         Walk 10 feet on uneven surface  activity   Assist     Assist level: Moderate Assistance - Patient - 50 - 74% Assistive device:  (R hand rail)   Wheelchair     Assist Is the patient using a wheelchair?: No             Wheelchair 50 feet with 2 turns activity    Assist            Wheelchair 150 feet activity     Assist          Blood pressure (!) 142/63, pulse 68, temperature 97.7 F (36.5 C), resp. rate 16, height 5\' 6"  (1.676 m), weight 104.2 kg, SpO2 96 %.  Medical Problem List and Plan: 1. 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 Interdisciplinary team conference today to discuss goals, barriers to discharge, and dc planning.    -yesterday spoke with husband and family regarding Mrs. Montesinos's condition, ELOS, prognosis, expectations, etc.  2.  Antithrombotics: -DVT/anticoagulation:   none given IPH             -antiplatelet therapy: none 3. Pain Management: Tylenol PRN 4. Anxiety/sun-downing: Xanax .0.25 mg q HS PRN sleep, anxiety             -antipsychotic agents: seroquel  1/24 sundowning still, a little better last night per rn   -sorbitol at 25mg  at 4pm and 9pm   -labs, urine all within normal limits   -apparently got combative in CT and they couldn't complete test. I would like to try again today.   -I do think her cognition will improve in familiar environment at home 5. Neuropsych: This patient is not capable of making decisions on her own behalf. 6. Skin/Wound Care: Routine skin checks 7. Fluids/Electrolytes/Nutrition:    -intake fair--continue to encourage  -continue protein supplement 8. Hypertension:   --No home medications PTA Vitals:   06/18/21 1900 06/19/21 0546  BP: 135/65 (!) 142/63  Pulse: 77 68  Resp: 16 16  Temp: 97.6 F (36.4 C) 97.7 F (36.5 C)  SpO2: 97% 96%  Controlled 1/24 on propranolol for tremor--continue same regimen  9. Hyperlipidemia: continue Crestor 10: DM-2: carb modified diet; Hgb A1c = 5.5.    -no cbg checks needed 11: Trigeminal neuralgia: continue Lamictal, duloxetine and Lyrica.  --Followed by [facility]. --seems controlled. Has been on these meds chronicaly 12. Essential tremor: on bid propranolol with some improvement  -appears improved--will conitnue current dosing     LOS: 12 days A FACE TO FACE EVALUATION WAS PERFORMED  Meredith Staggers 06/19/2021, 9:06 AM

## 2021-06-20 MED ORDER — LORAZEPAM 2 MG/ML IJ SOLN
0.5000 mg | Freq: Once | INTRAMUSCULAR | Status: AC
  Administered 2021-06-20: 19:00:00 0.5 mg via INTRAMUSCULAR
  Filled 2021-06-20: qty 1

## 2021-06-20 NOTE — Discharge Instructions (Addendum)
Inpatient Rehab Discharge Instructions  Shelby Hill [facility] Discharge date and time: 06/22/2021  Activities/Precautions/ Functional Status: Activity: no lifting, driving, or strenuous exercise for until cleared by MD Diet: cardiac diet Wound Care: none needed Functional status:  ___ No restrictions     ___ Walk up steps independently _x__ 24/7 supervision/assistance   ___ Walk up steps with assistance ___ Intermittent supervision/assistance  ___ Bathe/dress independently ___ Walk with walker     __x_ Bathe/dress with assistance ___ Walk Independently    ___ Shower independently ___ Walk with assistance    __x_ Shower with assistance _x__ No alcohol     ___ Return to work/school ________   COMMUNITY REFERRALS UPON DISCHARGE:    Home Health:   PT      OT      ST         SNA                     [place] *Please expect phone call within 2-3 days of discharge to schedule your home visit. If you have not received follow-up, be sure to contact the branch directly.*   Medical Equipment/Items Ordered: 3in1 bedside commode                                                 Agency/Supplier: [employer] [phone]   Special Instructions:  No driving, alcohol consumption or tobacco use.   My questions have been answered and I understand these instructions. I will adhere to these goals and the provided educational materials after my discharge from the hospital.  [place] Cigarette smoking nearly doubles your risk of having a stroke & is the single most alterable risk factor  If you smoke or have smoked in the last 12 months, you are advised to quit smoking for your health. Most of the excess cardiovascular risk related to smoking disappears within a year of stopping. Ask you doctor about anti-smoking medications [place] Quit Line: 1-800-QUIT NOW Free Smoking Cessation Classes [phone]  CHOLESTEROL Know your levels; limit fat &  cholesterol in your diet  Lipid Panel     Component Value Date/Time   CHOL 127 06/06/2021 0357   TRIG 45 06/06/2021 0357   HDL 62 06/06/2021 0357   CHOLHDL 2.0 06/06/2021 0357   VLDL 9 06/06/2021 0357   LDLCALC 56 06/06/2021 0357     Many patients benefit from treatment even if their cholesterol is at goal. Goal: Total Cholesterol (CHOL) less than 160 Goal:  Triglycerides (TRIG) less than 150 Goal:  HDL greater than 40 Goal:  LDL (LDLCALC) less than 100   BLOOD PRESSURE American Stroke Association blood pressure target is less that 120/80 mm/Hg  Your discharge blood pressure is:  BP: (!) 125/56 Monitor your blood pressure Limit your salt and alcohol intake Many individuals will require more than one medication for high blood pressure  DIABETES (A1c is a blood sugar average for last 3 months) Goal HGBA1c is under 7% (HBGA1c is blood sugar average for last 3 months)  Diabetes: No known diagnosis of diabetes    Lab Results  Component Value Date   HGBA1C 5.5 06/06/2021    Your HGBA1c can be lowered with medications, healthy diet, and exercise. Check your blood sugar as directed by your physician Call your physician if  you experience unexplained or low blood sugars.  PHYSICAL ACTIVITY/REHABILITATION Goal is 30 minutes at least 4 days per week  Activity: Increase activity slowly, Therapies: Physical Therapy: Home Health, Occupational Therapy: Home Health, and Speech Therapy: Home Health Return to work: n/a Activity decreases your risk of heart attack and stroke and makes your heart stronger.  It helps control your weight and blood pressure; helps you relax and can improve your mood. Participate in a regular exercise program. Talk with your doctor about the best form of exercise for you (dancing, walking, swimming, cycling).  DIET/WEIGHT Goal is to maintain a healthy weight  Your discharge diet is:  Diet Order             Diet heart healthy/carb modified Room service appropriate?  Yes with Assist; Fluid consistency: Thin  Diet effective now                   liquids Your height is:  Height: 5\' 6"  (167.6 cm) Your current weight is: Weight: 104.2 kg Your Body Mass Index (BMI) is:  BMI (Calculated): 37.1 Following the type of diet specifically designed for you will help prevent another stroke. Your goal weight range is:   Your goal Body Mass Index (BMI) is 19-24. Healthy food habits can help reduce 3 risk factors for stroke:  High cholesterol, hypertension, and excess weight.  RESOURCES Stroke/Support Group:  Call [phone]   STROKE EDUCATION PROVIDED/REVIEWED AND GIVEN TO PATIENT Stroke warning signs and symptoms How to activate emergency medical system (call 911). Medications prescribed at discharge. Need for follow-up after discharge. Personal risk factors for stroke. Pneumonia vaccine given: No Flu vaccine given: No My questions have been answered, the writing is legible, and I understand these instructions.  I will adhere to these goals & educational materials that have been provided to me after my discharge from the hospital.     Patient/Caregiver Signature _______________________________ Date __________  Clinician Signature _______________________________________ Date __________  Please bring this form and your medication list with you to all your follow-up doctor's appointments.

## 2021-06-20 NOTE — Progress Notes (Signed)
Received a call from [facility] at 18:09,  Reporting Shelby Hill was combative and refusing po medication. Spoke with pharmacist We will give Lorazepam 0.5mg  IM x1, Dr Ranell Patrick is aware of the above and agrees with plan.

## 2021-06-20 NOTE — Progress Notes (Signed)
Pt slept all night with no behavioral episodes. No complaints at this time. All needs have been met, orders renewed. Call light within reach.

## 2021-06-20 NOTE — Progress Notes (Signed)
Inpatient Rehabilitation Discharge Medication Review by a Pharmacist  A complete drug regimen review was completed for this patient to identify any potential clinically significant medication issues.  High Risk Drug Classes Is patient taking? Indication by Medication  Antipsychotic Yes Seroquel, Xanax for anxiety, sun-downing, agitation  Anticoagulant No   Antibiotic No   Opioid No   Antiplatelet No   Hypoglycemics/insulin No   Vasoactive Medication Yes Propranolol for tremor  Chemotherapy No   Other Yes Lyrica, Cymbalta  and Lamictal for trigeminal neuralgia. Crestor for HLD Protonix for GERD Miacalcin for osteo     Type of Medication Issue Identified Description of Issue Recommendation(s)  Drug Interaction(s) (clinically significant)     Duplicate Therapy     Allergy     No Medication Administration End Date     Incorrect Dose     Additional Drug Therapy Needed     Significant med changes from prior encounter (inform family/care partners about these prior to discharge).    Other       Clinically significant medication issues were identified that warrant physician communication and completion of prescribed/recommended actions by midnight of the next day:  No  Pharmacist comments: None  Time spent performing this drug regimen review (minutes):  20 minutes   Tad Moore 06/20/2021 10:07 AM

## 2021-06-20 NOTE — Progress Notes (Signed)
Physical Therapy Session Note  Patient Details  Name: Shelby Hill MRN: [id number] Date of Birth: Nov 21, 1935  Today's Date: 06/20/2021 PT Individual Time: 1145-1220 PT Individual Time Calculation (min): 35 min   Short Term Goals: Week 2:  PT Short Term Goal 1 (Week 2): STGs = LTGs  Skilled Therapeutic Interventions/Progress Updates:     Patient in w/c with her husband in the room upon PT arrival. Patient alert and agreeable to PT session. Patient denied pain during session.  Patient tangential with language of confusion >75% of the session. Able to follow simple cues and demonstration to complete the Berg Balance Test to assess fall risk during session. Required increased time due to verbose language, redirection to task, and repetition of cues and demonstration to complete tasks.  Neuromuscular Re-ed: Patient performed the following balance assessment: Berg Balance Test Sit to Stand: Able to stand  independently using hands Standing Unsupported: Able to stand safely 2 minutes Sitting with Back Unsupported but Feet Supported on Floor or Stool: Able to sit safely and securely 2 minutes Stand to Sit: Controls descent by using hands Transfers: Able to transfer with verbal cueing and /or supervision Standing Unsupported with Eyes Closed: Able to stand 10 seconds safely Standing Ubsupported with Feet Together: Needs help to attain position but able to stand for 30 seconds with feet together From Standing, Reach Forward with Outstretched Arm: Reaches forward but needs supervision From Standing Position, Pick up Object from Floor: Able to pick up shoe, needs supervision From Standing Position, Turn to Look Behind Over each Shoulder: Needs supervision when turning Turn 360 Degrees: Needs close supervision or verbal cueing Standing Unsupported, Alternately Place Feet on Step/Stool: Needs assistance to keep from falling or unable to try Standing Unsupported, One Foot in Front: Able to take  small step independently and hold 30 seconds Standing on One Leg: Unable to try or needs assist to prevent fall Total Score: 29/56 Patient demonstrates increased fall risk as noted by score of 29/56 on Berg Balance Scale.  (<36= high risk for falls, close to 100%; 37-45 significant >80%; 46-51 moderate >50%; 52-55 lower >25%)  Patient in w/c with her husband in the room at end of session with breaks locked, seat belt alarm set, and all needs within reach.   Therapy Documentation Precautions:  Precautions Precautions: Fall (Simultaneous filing. User may not have seen previous data.) Precaution Comments: R knee problems recently Restrictions Weight Bearing Restrictions: No    Therapy/Group: Individual Therapy  Venia Riveron L Chaquana Nichols PT, DPT  06/20/2021, 12:43 PM

## 2021-06-20 NOTE — Progress Notes (Signed)
Pt became aggressive at this time. Removing restraints multiple times, attempting to bite and grab staff. No longer able to redirect pt, tele sitter remains in place. Called Danella Sensing to get PRN order.

## 2021-06-20 NOTE — Progress Notes (Signed)
PROGRESS NOTE   Subjective/Complaints: Pt had a good night. No behavioral issues reported. No problems this morning  ROS: Limited due to cognitive/behavioral   Objective:   CT HEAD WO CONTRAST (5MM)  Result Date: 06/19/2021 CLINICAL DATA:  Altered mental status EXAM: CT HEAD WITHOUT CONTRAST TECHNIQUE: Contiguous axial images were obtained from the base of the skull through the vertex without intravenous contrast. RADIATION DOSE REDUCTION: This exam was performed according to the departmental dose-optimization program which includes automated exposure control, adjustment of the mA and/or kV according to patient size and/or use of iterative reconstruction technique. COMPARISON:  Brain MRI 06/05/2021, CT head 06/05/2021 FINDINGS: Brain: Has been interval expected evolution of the intraparenchymal hematoma centered in the left caudate head with overall decreased size and density and decreased surrounding edema. Previously seen intraventricular blood has resolved. There is an re-expansion of the left frontal horn. There is no new intracranial hemorrhage or extra-axial fluid collection. There is no evidence of acute infarct. Background global parenchymal volume loss and chronic white matter microangiopathy is unchanged. There is no mass lesion. There is no midline shift. Vascular: No hyperdense vessel or unexpected calcification. Skull: Normal. Negative for fracture or focal lesion. Sinuses/Orbits: The paranasal sinuses are clear. Bilateral lens implants are in place. The globes and orbits are otherwise unremarkable. Other: None. IMPRESSION: 1. Expected evolution of the intraparenchymal hematoma centered in the left caudate head with overall decreased size and density, and decreased surrounding edema with re-expansion of the left frontal horn. Previously seen intraventricular blood products have resolved. 2. No new intracranial hemorrhage, extra-axial  fluid collection, or infarct. Electronically Signed   By: Valetta Mole M.D.   On: 06/19/2021 16:37   No results for input(s): WBC, HGB, HCT, PLT in the last 72 hours.  No results for input(s): NA, K, CL, CO2, GLUCOSE, BUN, CREATININE, CALCIUM in the last 72 hours.   Intake/Output Summary (Last 24 hours) at 06/20/2021 0841 Last data filed at 06/19/2021 1850 Gross per 24 hour  Intake 480 ml  Output --  Net 480 ml        Physical Exam: Vital Signs Blood pressure (!) 125/56, pulse 62, temperature 97.7 F (36.5 C), temperature source Oral, resp. rate 18, height 5\' 6"  (1.676 m), weight 104.2 kg, SpO2 98 %.  Constitutional: No distress . Vital signs reviewed. HEENT: NCAT, EOMI, oral membranes moist Neck: supple Cardiovascular: RRR without murmur. No JVD    Respiratory/Chest: CTA Bilaterally without wheezes or rales. Normal effort    GI/Abdomen: BS +, non-tender, non-distended Ext: no clubbing, cyanosis, or edema Psych: pleasant and cooperative  Skin: No evidence of breakdown, no evidence of rash Neuro: fluid aphasia, remains confused. Attention fair. Follows commands. Can answer some questions at least initially, appropriately. tremor under control although still present at times. Decreased FMC,   Sensed pain in all 4's. No resting tone, DTR's 1+ Musculoskeletal: no limb or trunk pain   Assessment/Plan: 1. 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 Physiatrist is providing close team supervision and 24 hour management of active medical problems listed below. Physiatrist and  rehab team continue to assess barriers to discharge/monitor patient progress toward functional and medical goals  Care Tool:  Bathing    Body parts bathed by patient: Right arm, Left arm, Chest, Abdomen, Front perineal area, Right upper leg, Left upper leg, Face   Body parts bathed by helper:  Buttocks, Right lower leg, Left lower leg     Bathing assist Assist Level: Minimal Assistance - Patient > 75%     Upper Body Dressing/Undressing Upper body dressing   What is the patient wearing?: Pull over shirt    Upper body assist Assist Level: Supervision/Verbal cueing    Lower Body Dressing/Undressing Lower body dressing      What is the patient wearing?: Pants     Lower body assist Assist for lower body dressing: Minimal Assistance - Patient > 75%     Toileting Toileting    Toileting assist Assist for toileting: Moderate Assistance - Patient 50 - 74%     Transfers Chair/bed transfer  Transfers assist     Chair/bed transfer assist level: Contact Guard/Touching assist     Locomotion Ambulation   Ambulation assist      Assist level: Moderate Assistance - Patient 50 - 74% Assistive device: Hand held assist Max distance: 25'   Walk 10 feet activity   Assist     Assist level: Moderate Assistance - Patient - 50 - 74% Assistive device: Hand held assist   Walk 50 feet activity   Assist Walk 50 feet with 2 turns activity did not occur: Safety/medical concerns         Walk 150 feet activity   Assist Walk 150 feet activity did not occur: Safety/medical concerns         Walk 10 feet on uneven surface  activity   Assist     Assist level: Moderate Assistance - Patient - 50 - 74% Assistive device:  (R hand rail)   Wheelchair     Assist Is the patient using a wheelchair?: No             Wheelchair 50 feet with 2 turns activity    Assist            Wheelchair 150 feet activity     Assist          Blood pressure (!) 125/56, pulse 62, temperature 97.7 F (36.5 C), temperature source Oral, resp. rate 18, height 5\' 6"  (1.676 m), weight 104.2 kg, SpO2 98 %.  Medical Problem List and Plan: 1. 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  Antithrombotics: -DVT/anticoagulation:   none given IPH             -antiplatelet therapy: none 3. Pain Management: Tylenol PRN 4. Anxiety/sun-downing: Xanax .0.25 mg q HS PRN sleep, anxiety             -antipsychotic agents: seroquel  1/25 sundowning better with seroquel   -sorbitol at 25mg  at 4pm and 9pm   -labs, urine all within normal limits   -CT demonstrating expected changes and interval improvement--reviewed with husband   -I do think her cognition will improve in familiar environment at home 5. Neuropsych: This patient is not capable of making decisions on her own behalf. 6.  Skin/Wound Care: Routine skin checks 7. Fluids/Electrolytes/Nutrition:    -team pushing po intake  -continue protein supplement 8. Hypertension:   --No home medications PTA Vitals:   06/19/21 2017 06/20/21 0438  BP: (!) 125/56 (!) 125/56  Pulse: 74 62  Resp: 18 18  Temp: 97.7 F (36.5 C) 97.7 F (36.5 C)  SpO2: 99% 98%  Controlled 1/25 on propranolol for tremor--continue same regimen  9. Hyperlipidemia: continue Crestor 10: DM-2: carb modified diet; Hgb A1c = 5.5.    -no cbg checks needed 11: Trigeminal neuralgia: continue Lamictal, duloxetine and Lyrica.  --Followed by [facility]. --pain remains controlled 12. Essential tremor: on bid propranolol with some improvement  -appears improved--will conitnue current dosing     LOS: 13 days A FACE TO FACE EVALUATION WAS PERFORMED  Meredith Staggers 06/20/2021, 8:41 AM

## 2021-06-20 NOTE — Progress Notes (Signed)
Speech Language Pathology Daily Session Note  Patient Details  Name: Shelby Hill MRN: [id number] Date of Birth: 30-Jun-1935  Today's Date: 06/20/2021 SLP Individual Time: 1000-1040 SLP Individual Time Calculation (min): 40 min  Short Term Goals: Week 2: SLP Short Term Goal 1 (Week 2): STGs=LTGs due to ELOS  Skilled Therapeutic Interventions: Skilled treatment session focused on cognitive-linguistic goals. SLP facilitated session by providing Max A verbal and visual cues for functional problem solving with a basic task. Patient with language of confusion with poor topic maintenance but with Mod verbal cues could sustain attention to task/topic for ~1-2 minute intervals. Patient had received cards and was able to read them aloud with Min verbal cues to self-monitor and correct errors. Patient appeared mildly confused and perseverative on an "event in [place]" that required Mod verbal cues to redirect. Overall, patient calm and cooperative throughout session. Patient left upright in bed with alarm on and all needs within reach.      Pain Pain Assessment Pain Scale: 0-10 Pain Score: 0-No pain  Therapy/Group: Individual Therapy  Marguarite Markov 06/20/2021, 12:00 PM

## 2021-06-20 NOTE — Progress Notes (Signed)
Physical Therapy Session Note  Patient Details  Name: Shelby Hill MRN: [id number] Date of Birth: November 02, 1935  Today's Date: 06/20/2021 PT Individual Time: 0902-0944 PT Individual Time Calculation (min): 42 min   Short Term Goals: Week 2:  PT Short Term Goal 1 (Week 2): STGs = LTGs  Skilled Therapeutic Interventions/Progress Updates:     Pt received supine in bed and agrees to therapy. Reports no pain. Supine to sit with verbal cues for sequencing and positioning at EOB. Pt dons shoes with setup assistance. Pt requires minA for first sit to stand as pt had not yet been out of bed this morning. Transfer to [facility] with CGA. WC transport to gym for time management. Pt performs pathfinding activity, tasked with ambulating to numbered latex circles on ground in sequence form 1 to 10. Pt requires min cueing to complete and has x1 complete LOB to the L, requiring totalA to maintain balance. Following seated rest break, pt performs again with timing component to test pt's retention and carryover. Pt completes task in 1:27 seconds with CGA and no LOBs. On subsequent trial, pt completes with less cueing required and in time of 56 seconds. WC transport back to room. Pt left seated with alarm intact and all needs within reach.  Therapy Documentation Precautions:  Precautions Precautions: Fall (Simultaneous filing. User may not have seen previous data.) Precaution Comments: R knee problems recently Restrictions Weight Bearing Restrictions: No   Therapy/Group: Individual Therapy  Breck Coons, PT, DPT 06/20/2021, 4:32 PM

## 2021-06-20 NOTE — Progress Notes (Signed)
Occupational Therapy Session Note  Patient Details  Name: Shelby Hill MRN: [id number] Date of Birth: Jun 07, 1935  Today's Date: 06/20/2021 OT Individual Time: 1400-1445 OT Individual Time Calculation (min): 45 min  and Today's Date: 06/20/2021 OT Missed Time: 15 Minutes Missed Time Reason: Patient fatigue   Short Term Goals: Week 2:  OT Short Term Goal 1 (Week 2): LTG=STG 2/2 ELOS  Skilled Therapeutic Interventions/Progress Updates:    Pt received sitting in the w/c with no c/o pain, husband present but leaving soon thereafter. Pt with severe, fluent language of confusion/confabulation throughout session, very tangential. Pt required frequent redirection with both active and passive strategies. Followed lead of the pt once task was introduced and initiated. Pt overall at (S) level for functional mobility with and without RW. Functional tasks were chosen to promote maximal participation. Pt completed bed making task with min A, appropriately asking for OT help during several steps. Functional reaching into cabinets in the kitchen with object recognition and search. Attempted to get pt to take shower but she adamantly refused, so respected her wishes. Pt reported she was very tired and requested to return to her room. She sat in the w/c and declined any further activity. She was left sitting up with all needs met, chair alarm belt set. 15 min missed.   Therapy Documentation Precautions:  Precautions Precautions: Fall (Simultaneous filing. User may not have seen previous data.) Precaution Comments: R knee problems recently Restrictions Weight Bearing Restrictions: No   Therapy/Group: Individual Therapy  Curtis Sites 06/20/2021, 7:32 AM

## 2021-06-20 NOTE — Progress Notes (Signed)
Patient ID: Shelby Hill, female   DOB: 01/10/1936, 86 y.o.   MRN: [id number]  SW left message for Ethelda Chick [facility] ([phone]) at her request tot follow-up about documentation needed for patient.   Loralee Pacas, MSW, [employer] Office: [phone] Cell: [phone] Fax: [phone]

## 2021-06-20 NOTE — Patient Care Conference (Signed)
Inpatient RehabilitationTeam Conference and Plan of Care Update Date: 06/19/2021   Time: 10:37 AM    Patient Name: Shelby Hill [facility]      Medical Record Number: [id number]  Date of Birth: [birth year] Sex: Female         Room/Bed: 4M12C/4M12C-01 Payor Info: Payor: [employer] MEDICARE / Plan: [employer] MEDICARE CHOICE PPO / Product Type: *No Product type* /    Admit Date/Time:  06/07/2021  4:55 PM  Primary Diagnosis:  ICH (intracerebral hemorrhage) [facility])  Hospital Problems: Principal Problem:   ICH (intracerebral hemorrhage) [facility])    Expected Discharge Date: Expected Discharge Date: 06/22/21  Team Members Present: Physician leading conference: Dr. Alger Simons Social Worker Present: Loralee Pacas, LCSWA PT Present: Tereasa Coop, PT OT Present: Turner Daniels, OT SLP Present: Weston Anna, SLP PPS Coordinator present : Gunnar Fusi, SLP     Current Status/Progress Goal Weekly Team Focus  Bowel/Bladder   Pt is contintent with incontinet episodes. LBm: 1/22  Increase continance  Frequent toileting Q shift and PRN   Swallow/Nutrition/ Hydration             ADL's   Min/CGA overall, still with language of confusion and poor safety awareness, incontinent  Supervision  language of confusion, poor safety awareness, self-care retraining, pt/family education   Mobility   CGA, ambulating >500' with SPC     DC prep, family ed, safety awareness, balance   Communication   Min-Mod A  Min A  word-finding, auditory comprehension, awareness of errors   Safety/Cognition/ Behavioral Observations  Mod-Max A  Min A  sustained attention, orientation, behavior management   Pain   Pt denies any pain  remain pain free  Pain assessment Q shift and PRN   Skin   Skin is intact  Remain intact  Skin assessment Q shift and PRN     Discharge Planning:  Pt to d/c tohome with her husband who will provide 24/7 care. He has limited physivcal abilities and  uses a cane; also memory deficits due to  old CVA. Patient will have PRN support from children and neighbors. HHA pending preference. Sitter list provided to pt son Gwyndolyn Saxon to hire additional supports if they choose.   Team Discussion: Patient sundowning; MD adjusted meds for mood. CT follow up ordered per MD. Language of confusion still noted although jargon is decreased. Working on reading comprehension and word finding.  Patient on target to meet rehab goals: Patient currently needs min - CGA for ADLs and CGA for PT. Appears to have plateaued. Goals for discharge set for supervision overall. *See Care Plan and progress notes for long and short-term goals.   Revisions to Treatment Plan:  N/A  Teaching Needs: Safety, transfers, toileting, medication management, dietary modifications, etc.   Current Barriers to Discharge: Decreased caregiver support  Possible Resolutions to Barriers: Family education Private duty caregivers to support spouse when children are at work [facility] follow up services recommended; 24 hour supervision     Medical Summary Current Status: sundowning, made worse by aphasia. labs wnl. CT of head pending for today  Barriers to Discharge: Medical stability   Possible Resolutions to Barriers/Weekly Focus: daily assessment of labs, sleep and behavioral patterns   Continued Need for Acute Rehabilitation Level of Care: The patient requires daily medical management by a physician with specialized training in physical medicine and rehabilitation for the following reasons: Direction of a multidisciplinary physical rehabilitation program to maximize functional independence : Yes Medical management of patient stability for increased activity during  participation in an intensive rehabilitation regime.: Yes Analysis of laboratory values and/or radiology reports with any subsequent need for medication adjustment and/or medical intervention. : Yes   I attest that I was present, lead the team conference, and concur with  the assessment and plan of the team.   Dorien Chihuahua B 06/20/2021, 9:30 AM

## 2021-06-21 LAB — CBC
HCT: 38.9 % (ref 36.0–46.0)
Hemoglobin: 12.7 g/dL (ref 12.0–15.0)
MCH: 30 pg (ref 26.0–34.0)
MCHC: 32.6 g/dL (ref 30.0–36.0)
MCV: 91.7 fL (ref 80.0–100.0)
Platelets: 200 10*3/uL (ref 150–400)
RBC: 4.24 MIL/uL (ref 3.87–5.11)
RDW: 13.1 % (ref 11.5–15.5)
WBC: 5.4 10*3/uL (ref 4.0–10.5)
nRBC: 0 % (ref 0.0–0.2)

## 2021-06-21 LAB — BASIC METABOLIC PANEL
Anion gap: 8 (ref 5–15)
BUN: 17 mg/dL (ref 8–23)
CO2: 27 mmol/L (ref 22–32)
Calcium: 9 mg/dL (ref 8.9–10.3)
Chloride: 102 mmol/L (ref 98–111)
Creatinine, Ser: 0.78 mg/dL (ref 0.44–1.00)
GFR, Estimated: >60 mL/min (ref >60)
Glucose, Bld: 98 mg/dL (ref 70–99)
Potassium: 3.8 mmol/L (ref 3.5–5.1)
Sodium: 137 mmol/L (ref 135–145)

## 2021-06-21 MED ORDER — ACETAMINOPHEN 325 MG PO TABS: 325.0000 mg | ORAL_TABLET | ORAL | Status: AC | PRN

## 2021-06-21 MED ORDER — LORAZEPAM 2 MG/ML IJ SOLN
0.5000 mg | Freq: Once | INTRAMUSCULAR | Status: AC
  Administered 2021-06-21: 0.5 mg via INTRAMUSCULAR
  Filled 2021-06-21: qty 1

## 2021-06-21 MED ORDER — PROPRANOLOL HCL 10 MG PO TABS: 10.0000 mg | ORAL_TABLET | Freq: Two times a day (BID) | ORAL | 0 refills | Status: DC

## 2021-06-21 MED ORDER — ENSURE MAX PROTEIN PO LIQD: 11.0000 [oz_av] | Freq: Every day | ORAL | Status: AC

## 2021-06-21 MED ORDER — QUETIAPINE FUMARATE 25 MG PO TABS: 25.0000 mg | ORAL_TABLET | Freq: Two times a day (BID) | ORAL | 0 refills | Status: DC

## 2021-06-21 NOTE — Progress Notes (Signed)
Physical Therapy Discharge Summary  Patient Details  Name: Shelby Hill MRN: [id number] Date of Birth: 1935/11/25  Today's Date: 06/21/2021 PT Individual Time: 7017-7939 and 0300-9233 PT Individual Time Calculation (min): 55 min and 56 min   Patient has met 9 of 9 long term goals due to improved activity tolerance, improved balance, improved postural control, increased strength, improved attention, improved awareness, and improved coordination.  Patient to discharge at an ambulatory level Supervision.   Patient's husband and daughter attended family education and are independent to provide the necessary physical and cognitive assistance at discharge.  Reasons goals not met: NA  Recommendation:  Patient will benefit from ongoing skilled PT services in home health setting to continue to advance safe functional mobility, address ongoing impairments in balance, transfers, ambulation, and minimize fall risk.  Equipment: No equipment provided  Reasons for discharge: treatment goals met and discharge from hospital  Patient/family agrees with progress made and goals achieved: Yes  Skilled Therapeutic Interventions:  1st Session: Pt received seated in [facility] and agrees to therapy. No complaint of pain. Husband and daughter present for family education. PT educates on importance of providing CGA for pt anytime she is ambulating. Also educated on use of bed and chair alarms due to pt's balance and cognitive deficits.   Pt performs car transfer with SPC and cues for sequencing. Pt then performs ramp navigation with SPC and CGA. Pt's daughter provides CGA as pt performs x4 6" steps with L hand rail and using SPC in R hand. Daughter then provides CGA as pt ambulates x100' with SPC. PT cues for positioning and hand placement for safety. Husband provides CGA for x50'. WC transport back to room. Left seated in WC with alarm intact and all needs within reach.  2nd Session: Pt received seated in [facility] and agrees  to therapy. No complaint of pain. Wc transport to gym for time management. Pt performs stand step transfer to Nustep with SPC with cues for sequencing and safe positioning. Pt completes Nustep for strength and endurance training as well as attention to task. Pt cued to maintain Steps per minute >40. Pt able to performs for 15:00 at workload of 4 with occasional cueing to increase rate. Pt ambulates x200' with close supervision and cues for upright gaze to improve posture and balance. Following seated rest break, pt completes x12 6" steps with bilateral hand rails and close supervision, with cues for step sequencing.   Pt performs sit<>supine with cues for positioning and body mechanics. Stand step transfer back to [facility] with SPC and cues for sequencing. Left seated with alarm intact and all needs within reach.  PT Discharge Precautions/Restrictions Precautions Precautions: Fall Precaution Comments: L knee pain Restrictions Weight Bearing Restrictions: No Pain Interference Pain Interference Pain Effect on Sleep: 1. Rarely or not at all Pain Interference with Therapy Activities: 1. Rarely or not at all Pain Interference with Day-to-Day Activities: 1. Rarely or not at all Vision/Perception  Vision - History Ability to See in Adequate Light: 0 Adequate Perception Perception: Within Functional Limits Praxis Praxis: Impaired Praxis Impairment Details: Initiation;Motor planning Praxis-Other Comments: Improved from eval  Cognition Overall Cognitive Status: Impaired/Different from baseline Arousal/Alertness: Awake/alert Orientation Level: Oriented to person;Disoriented to place;Disoriented to time;Disoriented to situation (May be due to aphasia) Memory: Impaired Problem Solving: Impaired Safety/Judgment: Impaired Sensation Sensation Light Touch: Appears Intact Coordination Gross Motor Movements are Fluid and Coordinated: No Fine Motor Movements are Fluid and Coordinated: No Coordination and  Movement Description: Moderate amplitude tremors, improved  from eval Motor  Motor Motor - Skilled Clinical Observations: Noted tremors BUEs>BLEs, improved from eval  Mobility Bed Mobility Bed Mobility: Supine to Sit;Sit to Supine Supine to Sit: Supervision/Verbal cueing Sit to Supine: Supervision/Verbal cueing Transfers Transfers: Sit to Stand;Stand to Sit;Stand Pivot Transfers Sit to Stand: Supervision/Verbal cueing Stand to Sit: Supervision/Verbal cueing Stand Pivot Transfers: Supervision/Verbal cueing Stand Pivot Transfer Details: Verbal cues for precautions/safety;Verbal cues for technique Transfer (Assistive device): Straight cane Locomotion  Gait Ambulation: Yes Gait Assistance: Supervision/Verbal cueing Gait Distance (Feet): 200 Feet Assistive device: Straight cane Gait Assistance Details: Verbal cues for technique;Verbal cues for safe use of DME/AE;Verbal cues for precautions/safety Gait Gait: Yes Gait Pattern: Impaired Gait Pattern: Decreased stride length (LLE decreased stride length) Gait velocity: decr Stairs / Additional Locomotion Stairs: Yes Stairs Assistance: Supervision/Verbal cueing Stair Management Technique: Two rails Number of Stairs: 12 Height of Stairs: 6 Ramp: Contact Guard/touching assist Curb: Supervision/Verbal cueing Wheelchair Mobility Wheelchair Mobility: No  Trunk/Postural Assessment  Cervical Assessment Cervical Assessment:  (forward head) Thoracic Assessment Thoracic Assessment:  (rounded shoulders) Lumbar Assessment Lumbar Assessment:  (posterior pelvic tilt) Postural Control Postural Control: Deficits on evaluation (delayed, but improved from eval)  Balance Balance Balance Assessed: Yes Static Sitting Balance Static Sitting - Balance Support: Feet supported Static Sitting - Level of Assistance: 5: Stand by assistance Dynamic Sitting Balance Dynamic Sitting - Balance Support: Feet supported Dynamic Sitting - Level of Assistance:  5: Stand by assistance Static Standing Balance Static Standing - Balance Support: During functional activity;Right upper extremity supported Static Standing - Level of Assistance: 5: Stand by assistance Dynamic Standing Balance Dynamic Standing - Balance Support: During functional activity;Right upper extremity supported Dynamic Standing - Level of Assistance: 5: Stand by assistance Extremity Assessment  RLE Assessment General Strength Comments: Grossly 4+/5 LLE Assessment LLE Assessment: Exceptions to [facility] General Strength Comments: Grossly 3+/5    Breck Coons, PT, DPT 06/21/2021, 4:05 PM

## 2021-06-21 NOTE — Progress Notes (Signed)
Patient slept through night with no behaviors noted. Tele sitter in place. Restraints in place with no sign of injury. All needs have been met. Will continue to round per protocol.

## 2021-06-21 NOTE — Progress Notes (Signed)
Occupational Therapy Discharge Summary  Patient Details  Name: Shelby Hill MRN: [id number] Date of Birth: [birth year]  Today's Date: 06/21/2021 OT Individual Time: 7026-3785 OT Individual Time Calculation (min): 56 min   Pt greeted semi-reclined in bed with family present for family education. Education focused on behavior and cognitive deficits within functional BADL tasks. Pt much more argumentative with family and does not take directions well from family. OT educated on how to best cue patient to diminish agitative behaviors and how to keep her safe.Pt's daughter Shelby Hill provided supervision for functional ambulation, toilet transfers, shower transfers, and all self-care tasks. Pt continued with language of confusion with multimodal cues for safety and sequencing. OT discussed with family use of posey belt in bed, bed rails, floor alarms, bed alarms, and baby monitor. Family reports they plan for 24/7 support and especially at nights when patient is more agitated. Pt left seated in wc at end of session with alarm belt on, call bell in reach, and needs met.  Patient has met 10 of 12 long term goals due to improved activity tolerance, improved balance, postural control, and ability to compensate for deficits.  Patient to discharge at overall Supervision level.  Patient's care partner is independent to provide the necessary cognitive assistance at discharge.    Reasons goals not met: Patient still has severe cognitive deficits with poor attention and awareness. She needs supervision for all BADL tasks including grooming tasks. Pt also requires Max A for awareness and did not meet her awareness goal.  Recommendation:  Patient will benefit from ongoing skilled OT services in home health setting to continue to advance functional skills in the area of BADL and Reduce care partner burden.  Equipment: 3-in-1 BSC  Reasons for discharge: treatment goals met and discharge from hospital  Patient/family  agrees with progress made and goals achieved: Yes  OT Discharge Precautions/Restrictions  Precautions Precautions: Fall Precaution Comments: L knee pain Restrictions Weight Bearing Restrictions: No Pain  Denies pain ADL ADL Eating: Independent Grooming: Supervision/safety Upper Body Bathing: Supervision/safety Where Assessed-Upper Body Bathing: Shower Lower Body Bathing: Supervision/safety Where Assessed-Lower Body Bathing: Shower Upper Body Dressing: Setup Lower Body Dressing: Supervision/safety Toileting: Supervision/safety Where Assessed-Toileting: Glass blower/designer: Close supervision Toilet Transfer Method: Counselling psychologist: Raised toilet seat, Grab bars Tub/Shower Transfer: Close supervison Social research officer, government: Contact guard, Minimal cueing Social research officer, government Method: Heritage manager: Grab bars, Gaffer Baseline Vision/History: 0 No visual deficits Perception  Perception: Within Functional Limits Praxis Praxis: Impaired Praxis Impairment Details: Initiation;Motor planning Praxis-Other Comments: Improved from eval Cognition Overall Cognitive Status: Impaired/Different from baseline Arousal/Alertness: Awake/alert Orientation Level: Oriented to person;Oriented to place;Oriented to time;Oriented to situation (with cues) Year:  (2002, 2003) Month: January Day of Week: Correct Memory: Impaired Memory Impairment: Decreased recall of new information;Decreased short term memory Decreased Short Term Memory: Verbal basic;Functional basic Immediate Memory Recall: Sock;Blue;Bed Memory Recall Sock: Not able to recall Memory Recall Blue: With Cue Memory Recall Bed: Not able to recall Awareness: Impaired Awareness Impairment: Intellectual impairment Problem Solving: Impaired Problem Solving Impairment: Functional basic;Verbal basic Behaviors: Poor frustration tolerance;Verbal  agitation;Impulsive Safety/Judgment: Impaired Comments: language of confusion Sensation Sensation Light Touch: Appears Intact Coordination Gross Motor Movements are Fluid and Coordinated: No Fine Motor Movements are Fluid and Coordinated: No Coordination and Movement Description: Moderate amplitude tremors, improved from eval Motor  Motor Motor - Skilled Clinical Observations: Noted tremors BUEs>BLEs, improved from eval Mobility  Bed Mobility Bed Mobility: Supine to Sit;Sit  to Supine Supine to Sit: Supervision/Verbal cueing Sit to Supine: Supervision/Verbal cueing Transfers Sit to Stand: Supervision/Verbal cueing Stand to Sit: Supervision/Verbal cueing  Trunk/Postural Assessment  Cervical Assessment Cervical Assessment:  (forward head) Thoracic Assessment Thoracic Assessment:  (rounded shoulders) Lumbar Assessment Lumbar Assessment:  (posterior pelvic tilt) Postural Control Postural Control: Deficits on evaluation (delayed, but improved from eval)  Balance Balance Balance Assessed: Yes Static Sitting Balance Static Sitting - Balance Support: Feet supported Static Sitting - Level of Assistance: 5: Stand by assistance Dynamic Sitting Balance Dynamic Sitting - Balance Support: Feet supported Dynamic Sitting - Level of Assistance: 5: Stand by assistance Static Standing Balance Static Standing - Balance Support: During functional activity;Right upper extremity supported Static Standing - Level of Assistance: 5: Stand by assistance Dynamic Standing Balance Dynamic Standing - Balance Support: During functional activity;Right upper extremity supported Dynamic Standing - Level of Assistance: 5: Stand by assistance Extremity/Trunk Assessment RUE Assessment General Strength Comments: strength WFL, tremors, decreased smoothness and accuracy LUE Assessment General Strength Comments: strength WFL, tremors, decreased smoothness and accuracy   Daneen Schick Kayslee Furey 06/21/2021, 3:14 PM

## 2021-06-21 NOTE — Plan of Care (Signed)
Problem: RH Balance Goal: LTG: Patient will maintain dynamic sitting balance (OT) Description: LTG:  Patient will maintain dynamic sitting balance with assistance during activities of daily living (OT) Outcome: Completed/Met Goal: LTG Patient will maintain dynamic standing with ADLs (OT) Description: LTG:  Patient will maintain dynamic standing balance with assist during activities of daily living (OT)  Outcome: Completed/Met   Problem: Sit to Stand Goal: LTG:  Patient will perform sit to stand in prep for activites of daily living with assistance level (OT) Description: LTG:  Patient will perform sit to stand in prep for activites of daily living with assistance level (OT) Outcome: Completed/Met   Problem: RH Eating Goal: LTG Patient will perform eating w/assist, cues/equip (OT) Description: LTG: Patient will perform eating with assist, with/without cues using equipment (OT) Outcome: Completed/Met   Problem: RH Grooming Goal: LTG Patient will perform grooming w/assist,cues/equip (OT) Description: LTG: Patient will perform grooming with assist, with/without cues using equipment (OT) Outcome: Not Met (add Reason) Flowsheets (Taken 06/21/2021 1459) LTG: Pt will perform grooming with assistance level of: Supervision/Verbal cueing Note: Not met due to patient needing cues for using the correct items-ESD   Problem: RH Bathing Goal: LTG Patient will bathe all body parts with assist levels (OT) Description: LTG: Patient will bathe all body parts with assist levels (OT) Outcome: Completed/Met   Problem: RH Dressing Goal: LTG Patient will perform upper body dressing (OT) Description: LTG Patient will perform upper body dressing with assist, with/without cues (OT). Outcome: Completed/Met Goal: LTG Patient will perform lower body dressing w/assist (OT) Description: LTG: Patient will perform lower body dressing with assist, with/without cues in positioning using equipment (OT) Outcome:  Completed/Met   Problem: RH Toileting Goal: LTG Patient will perform toileting task (3/3 steps) with assistance level (OT) Description: LTG: Patient will perform toileting task (3/3 steps) with assistance level (OT)  Outcome: Completed/Met   Problem: RH Toilet Transfers Goal: LTG Patient will perform toilet transfers w/assist (OT) Description: LTG: Patient will perform toilet transfers with assist, with/without cues using equipment (OT) Outcome: Completed/Met   Problem: RH Tub/Shower Transfers Goal: LTG Patient will perform tub/shower transfers w/assist (OT) Description: LTG: Patient will perform tub/shower transfers with assist, with/without cues using equipment (OT) Outcome: Completed/Met   Problem: RH Awareness Goal: LTG: Patient will demonstrate awareness during functional activites type of (OT) Description: LTG: Patient will demonstrate awareness during functional activites type of (OT) Outcome: Not Met (add Reason) Note: Patient needs max A for awareness -ESD

## 2021-06-21 NOTE — Progress Notes (Signed)
PROGRESS NOTE   Subjective/Complaints: No major issues last night. Behaviors improving.   ROS: Limited due to cognitive/behavioral   Objective:   CT HEAD WO CONTRAST (5MM)  Result Date: 06/19/2021 CLINICAL DATA:  Altered mental status EXAM: CT HEAD WITHOUT CONTRAST TECHNIQUE: Contiguous axial images were obtained from the base of the skull through the vertex without intravenous contrast. RADIATION DOSE REDUCTION: This exam was performed according to the departmental dose-optimization program which includes automated exposure control, adjustment of the mA and/or kV according to patient size and/or use of iterative reconstruction technique. COMPARISON:  Brain MRI 06/05/2021, CT head 06/05/2021 FINDINGS: Brain: Has been interval expected evolution of the intraparenchymal hematoma centered in the left caudate head with overall decreased size and density and decreased surrounding edema. Previously seen intraventricular blood has resolved. There is an re-expansion of the left frontal horn. There is no new intracranial hemorrhage or extra-axial fluid collection. There is no evidence of acute infarct. Background global parenchymal volume loss and chronic white matter microangiopathy is unchanged. There is no mass lesion. There is no midline shift. Vascular: No hyperdense vessel or unexpected calcification. Skull: Normal. Negative for fracture or focal lesion. Sinuses/Orbits: The paranasal sinuses are clear. Bilateral lens implants are in place. The globes and orbits are otherwise unremarkable. Other: None. IMPRESSION: 1. Expected evolution of the intraparenchymal hematoma centered in the left caudate head with overall decreased size and density, and decreased surrounding edema with re-expansion of the left frontal horn. Previously seen intraventricular blood products have resolved. 2. No new intracranial hemorrhage, extra-axial fluid collection, or  infarct. Electronically Signed   By: Valetta Mole M.D.   On: 06/19/2021 16:37   Recent Labs    06/21/21 0544  WBC 5.4  HGB 12.7  HCT 38.9  PLT 200    Recent Labs    06/21/21 0544  NA 137  K 3.8  CL 102  CO2 27  GLUCOSE 98  BUN 17  CREATININE 0.78  CALCIUM 9.0     Intake/Output Summary (Last 24 hours) at 06/21/2021 1029 Last data filed at 06/21/2021 0900 Gross per 24 hour  Intake 800 ml  Output --  Net 800 ml        Physical Exam: Vital Signs Blood pressure 138/60, pulse 64, temperature 97.7 F (36.5 C), temperature source Oral, resp. rate 15, height 5\' 6"  (1.676 m), weight 104.2 kg, SpO2 96 %.  Constitutional: No distress . Vital signs reviewed. HEENT: NCAT, EOMI, oral membranes moist Neck: supple Cardiovascular: RRR without murmur. No JVD    Respiratory/Chest: CTA Bilaterally without wheezes or rales. Normal effort    GI/Abdomen: BS +, non-tender, non-distended Ext: no clubbing, cyanosis, or edema Psych: pleasant and cooperative  Skin: No evidence of breakdown, no evidence of rash Neuro: fluid aphasia, remains confused. Attention fair. Follows commands. Can answer some questions at least initially, appropriately. tremor under control although still present at times. Decreased FMC,   Sensed pain in all 4's. No resting tone, DTR's 1+ Musculoskeletal: no limb or trunk pain   Assessment/Plan: 1. 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  Physiatrist is providing close team supervision and 24 hour management of active medical problems listed below. Physiatrist and rehab team continue to assess barriers to discharge/monitor patient progress toward functional and medical goals  Care Tool:  Bathing    Body parts bathed by patient: Right arm, Left arm, Chest, Abdomen, Front perineal area, Right upper leg, Left upper leg, Face   Body parts bathed by  helper: Buttocks, Right lower leg, Left lower leg     Bathing assist Assist Level: Minimal Assistance - Patient > 75%     Upper Body Dressing/Undressing Upper body dressing   What is the patient wearing?: Pull over shirt    Upper body assist Assist Level: Supervision/Verbal cueing    Lower Body Dressing/Undressing Lower body dressing      What is the patient wearing?: Pants     Lower body assist Assist for lower body dressing: Minimal Assistance - Patient > 75%     Toileting Toileting    Toileting assist Assist for toileting: Moderate Assistance - Patient 50 - 74%     Transfers Chair/bed transfer  Transfers assist     Chair/bed transfer assist level: Contact Guard/Touching assist     Locomotion Ambulation   Ambulation assist      Assist level: Moderate Assistance - Patient 50 - 74% Assistive device: Hand held assist Max distance: 25'   Walk 10 feet activity   Assist     Assist level: Moderate Assistance - Patient - 50 - 74% Assistive device: Hand held assist   Walk 50 feet activity   Assist Walk 50 feet with 2 turns activity did not occur: Safety/medical concerns         Walk 150 feet activity   Assist Walk 150 feet activity did not occur: Safety/medical concerns         Walk 10 feet on uneven surface  activity   Assist     Assist level: Moderate Assistance - Patient - 50 - 74% Assistive device:  (R hand rail)   Wheelchair     Assist Is the patient using a wheelchair?: No             Wheelchair 50 feet with 2 turns activity    Assist            Wheelchair 150 feet activity     Assist          Blood pressure 138/60, pulse 64, temperature 97.7 F (36.5 C), temperature source Oral, resp. rate 15, height 5\' 6"  (1.676 m), weight 104.2 kg, SpO2 96 %.  Medical Problem List and Plan: 1. 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  Antithrombotics: -DVT/anticoagulation:   none given IPH             -antiplatelet therapy: none 3. Pain Management: Tylenol PRN 4. Anxiety/sun-downing: Xanax .0.25 mg q HS PRN sleep, anxiety             -antipsychotic agents: seroquel  1/26 sundowning better with seroquel   -seroquel at 25mg  at 4pm and 9pm   -labs, urine all within normal limits   -CT demonstrated expected changes and interval improvement--reviewed with husband yesterday   -I do think her cognition will improve in  familiar environment at home 5. Neuropsych: This patient is not capable of making decisions on her own behalf. 6. Skin/Wound Care: Routine skin checks 7. Fluids/Electrolytes/Nutrition:    -team pushing po intake  -continue protein supplement 8. Hypertension:   --No home medications PTA Vitals:   06/20/21 1938 06/21/21 0413  BP: 133/63 138/60  Pulse: 75 64  Resp: 18 15  Temp: 98.1 F (36.7 C) 97.7 F (36.5 C)  SpO2: 99% 96%  Controlled 1/26 on propranolol for tremor--continue same regimen  9. Hyperlipidemia: continue Crestor 10: DM-2: carb modified diet; Hgb A1c = 5.5.    -no cbg checks needed 11: Trigeminal neuralgia: continue Lamictal, duloxetine and Lyrica.  --Followed by [facility]. --pain remains controlled 12. Essential tremor: on bid propranolol with some improvement  -appears improved--will conitnue current dosing     LOS: 14 days A FACE TO FACE EVALUATION WAS PERFORMED  Meredith Staggers 06/21/2021, 10:29 AM

## 2021-06-21 NOTE — Progress Notes (Signed)
Speech Language Pathology Discharge Summary  Patient Details  Name: Shelby Hill MRN: [id number] Date of Birth: 1935/07/14  Today's Date: 06/21/2021 SLP Individual Time: 1658-0063 SLP Individual Time Calculation (min): 30 min   Skilled Therapeutic Interventions:  Skilled treatment session focused on completion of education with the patient, her daughter and husband. SLP facilitated session by providing education regarding patient's current cognitive deficits and strategies to utilize at home to maximize sustained attention, functional problem solving, functional recall and overall safety at home. SLP also provided education regarding patient's current language impairments and strategies to maximize auditory comprehension and overall verbal expression. SLP also emphasized the importance of 24 hour supervision at discharge. All questions were answered and handouts were given to reinforce information. Patient left upright in wheelchair with alarm on and all needs within reach.   Patient has met 3 of 5 long term goals.  Patient to discharge at overall Min-Mod A level.   Reasons goals not met: Patient's overall cognitive-linguistic functioning can fluctuate and patient requires overall Max A multimodal cues for comprehension of complex informatin and emergent awareness of errors.   Clinical Impression/Discharge Summary: Patient has made slow and inconsistent gains and has met - out of - LTGs this admission. Currently, patient demonstrates moderate-severe cognitive-linguistic impairments which can be difficult to differentiate at times. Patient can complete naming tasks and follow basic commands with 100% accuracy. Patient's reading comprehension appears intact at the phrase level with mild breakdown at the sentence level. Patient also demonstrates mild impairments with written expression. All basic language tasks can be completed accurately, however, multiple repetitions with extra time may be needed due  to impaired attention and recall. Patient's overall informal verbal expression is fluent and can be characterized as verbose, tangential, with phonemic paraphasias, and decreased word-finding with intermittent awareness and ability to self-monitor and correct. Patient continues to demonstrate overall moderate-severe cognitive impairments with improved orientation. However, mod-max multimodal cues are needed to complete functional and familiar tasks safely in regards to sustained attention, problem solving, safety, awareness and recall with generalized confusion present. All of the above can result in intermittent agitation at times, however, this seems to be improving overall. Patient and family education is complete and patient will discharge home with 24 hour supervision from family. Patient would benefit from f/u SLP services to maximize her cognitive-linguistic functioning in order to reduce caregiver burden.   Care Partner:  Caregiver Able to Provide Assistance: Yes  Type of Caregiver Assistance: Physical;Cognitive  Recommendation:  24 hour supervision/assistance;Home Health SLP  Rationale for SLP Follow Up: Reduce caregiver burden;Maximize functional communication;Maximize cognitive function and independence   Equipment: N/A   Reasons for discharge: Discharged from hospital   Patient/Family Agrees with Progress Made and Goals Achieved: Yes    Peck, McConnells 06/21/2021, 2:57 PM

## 2021-06-21 NOTE — Progress Notes (Signed)
Patient ID: Aline Brochure, female   DOB: 23-Oct-1935, 86 y.o.   MRN: [id number]  SW returned phone call/spoke with Ethelda Chick [facility] [phone]) to discuss clinicals requested (PT/OT) and current med list. She reported they will br providing assistance at night and need to know patient's abilities. SW explained will send AVS and d/c summary once available. SW emailed clinicals: [email or web address] .   SW met with pt, pt dtr Molly and pt husband in room to discuss above and review discharge. SW will order 3in1 BSC in the event there is not one in storage.   Loralee Pacas, MSW, [place] Office: [phone] Cell: [phone] Fax: [phone]

## 2021-06-22 MED ORDER — ALPRAZOLAM 0.5 MG PO TABS: 0.5000 mg | ORAL_TABLET | Freq: Every evening | ORAL | 0 refills | Status: AC | PRN

## 2021-06-22 NOTE — Progress Notes (Signed)
Inpatient Rehabilitation Care Coordinator Discharge Note   Patient Details  Name: Shelby Hill MRN: [id number] Date of Birth: 1935/10/17   Discharge location: D/c to home with support from husband. Aide assistance hired for evening. PRN check in by children.  Length of Stay: 14 days  Discharge activity level: Supervision  Home/community participation: Limited  Patient response BZ:JIRCVE Literacy - How often do you need to have someone help you when you read instructions, pamphlets, or other written material from your doctor or pharmacy?: Never  Patient response LF:YBOFBP Isolation - How often do you feel lonely or isolated from those around you?: Patient unable to respond  Services provided included: MD, RD, PT, OT, CM, RN, SLP, TR, Pharmacy, Neuropsych, SW  Financial Services:  Charity fundraiser Utilized: [place] [employer]  Choices offered to/list presented to: yes  Follow-up services arranged:  DME, [place] Agency: [place] for HHPT/OT/SLP/aide    DME : [employer] for 3in1 [facility]    Patient response to transportation need: Is the patient able to respond to transportation needs?: Yes In the past 12 months, has lack of transportation kept you from medical appointments or from getting medications?: No In the past 12 months, has lack of transportation kept you from meetings, work, or from getting things needed for daily living?: No  Comments (or additional information):  Patient/Family verbalized understanding of follow-up arrangements:  Yes  Individual responsible for coordination of the follow-up plan: contact pt husband Shelby Hill [phone]  Confirmed correct DME delivered: Rana Snare 06/22/2021    Rana Snare

## 2021-06-22 NOTE — Progress Notes (Signed)
PROGRESS NOTE   Subjective/Complaints: Had another good night. Does seem to be aware she's going home today!  ROS: limited due to language/communication   Objective:   No results found. Recent Labs    06/21/21 0544  WBC 5.4  HGB 12.7  HCT 38.9  PLT 200    Recent Labs    06/21/21 0544  NA 137  K 3.8  CL 102  CO2 27  GLUCOSE 98  BUN 17  CREATININE 0.78  CALCIUM 9.0     Intake/Output Summary (Last 24 hours) at 06/22/2021 0921 Last data filed at 06/22/2021 6222 Gross per 24 hour  Intake 480 ml  Output --  Net 480 ml        Physical Exam: Vital Signs Blood pressure 135/67, pulse 63, temperature 97.6 F (36.4 C), temperature source Oral, resp. rate 17, height 5\' 6"  (1.676 m), weight 102.5 kg, SpO2 98 %.  Constitutional: No distress . Vital signs reviewed. Bilateral wrist restraints HEENT: NCAT, EOMI, oral membranes moist Neck: supple Cardiovascular: RRR without murmur. No JVD    Respiratory/Chest: CTA Bilaterally without wheezes or rales. Normal effort    GI/Abdomen: BS +, non-tender, non-distended Ext: no clubbing, cyanosis, or edema Psych: pleasant and cooperative, a little distractible   Skin: No evidence of breakdown, no evidence of rash Neuro: fluent aphasia, remains confused. Attention fair. Follows commands. Can answer some questions at least initially, appropriately. tremor under control although still present at times. Decreased FMC,   Sensed pain in all 4's. No resting tone, DTR's 1+ Musculoskeletal: no limb or trunk pain   Assessment/Plan: 1. 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 Physiatrist is providing close team supervision and 24 hour management of active medical problems listed below. Physiatrist and rehab team continue to assess barriers to discharge/monitor patient  progress toward functional and medical goals  Care Tool:  Bathing    Body parts bathed by patient: Right arm, Left arm, Chest, Abdomen, Buttocks, Front perineal area, Left upper leg, Right lower leg, Left lower leg, Face, Right upper leg   Body parts bathed by helper: Buttocks, Right lower leg, Left lower leg     Bathing assist Assist Level: Supervision/Verbal cueing     Upper Body Dressing/Undressing Upper body dressing   What is the patient wearing?: Pull over shirt    Upper body assist Assist Level: Supervision/Verbal cueing    Lower Body Dressing/Undressing Lower body dressing      What is the patient wearing?: Pants     Lower body assist Assist for lower body dressing: Supervision/Verbal cueing     Toileting Toileting    Toileting assist Assist for toileting: Supervision/Verbal cueing     Transfers Chair/bed transfer  Transfers assist     Chair/bed transfer assist level: Supervision/Verbal cueing     Locomotion Ambulation   Ambulation assist      Assist level: Supervision/Verbal cueing Assistive device: Cane-straight Max distance: 200'   Walk 10 feet activity   Assist     Assist level: Supervision/Verbal cueing Assistive device: Cane-straight   Walk 50 feet activity  Assist Walk 50 feet with 2 turns activity did not occur: Safety/medical concerns  Assist level: Supervision/Verbal cueing Assistive device: Cane-straight    Walk 150 feet activity   Assist Walk 150 feet activity did not occur: Safety/medical concerns  Assist level: Supervision/Verbal cueing Assistive device: Cane-straight    Walk 10 feet on uneven surface  activity   Assist     Assist level: Contact Guard/Touching assist Assistive device: Cane-straight   Wheelchair     Assist Is the patient using a wheelchair?: No             Wheelchair 50 feet with 2 turns activity    Assist            Wheelchair 150 feet activity      Assist          Blood pressure 135/67, pulse 63, temperature 97.6 F (36.4 C), temperature source Oral, resp. rate 17, height 5\' 6"  (1.676 m), weight 102.5 kg, SpO2 98 %.  Medical Problem List and Plan: 1. 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 F/u with [facility], primary, neuro 2.  Antithrombotics: -DVT/anticoagulation:   none given IPH             -antiplatelet therapy: none 3. Pain Management: Tylenol PRN 4. Anxiety/sun-downing: Xanax .0.25 mg q HS PRN sleep, anxiety             -dc home on seroquel at 25mg  at 4pm and 9pm  -I do think her cognition will improve in familiar environment at home   8. Hypertension:   - Controlled 1/26 on propranolol for tremor--continue same regimen  9. Hyperlipidemia: continue Crestor 10: DM-2: diet controlled 11: Trigeminal neuralgia: continue Lamictal, duloxetine and Lyrica.  --Followed by [facility]. --pain remains controlled 12. Essential tremor: on bid propranolol with some improvement  -appears improved--will conitnue current dosing     LOS: 15 days A FACE TO FACE EVALUATION WAS PERFORMED  Meredith Staggers 06/22/2021, 9:21 AM

## 2021-06-26 ENCOUNTER — Telehealth: Payer: Self-pay

## 2021-06-26 NOTE — Telephone Encounter (Signed)
Transitional Care call -- Spoke with Shelby Hill    Are you/is patient experiencing any problems since coming home? No Are there any questions regarding any aspect of care? No Are there any questions regarding medications administration/dosing? Spouse wants to know if she should still be taking Seroquel because she is not as combative as she was in the hospital. Are meds being taken as prescribed? Yes Patient should review meds with caller to confirm Have there been any falls? No Has Home Health been to the house and/or have they contacted you? Yes If not, have you tried to contact them? Can we help you contact them? Are bowels and bladder emptying properly? Yes Are there any unexpected incontinence issues? No If applicable, is patient following bowel/bladder programs? Yes Any fevers, problems with breathing, unexpected pain? No Are there any skin problems or new areas of breakdown? No Has the patient/family member arranged specialty MD follow up (ie cardiology/neurology/renal/surgical/etc)?  Can we help arrange? Yes Does the patient need any other services or support that we can help arrange? No Are caregivers following through as expected in assisting the patient? Yes Has the patient quit smoking, drinking alcohol, or using drugs as recommended? Yes  Appointment time, arrive time and who it is with here 07/04/21 at 1:40 arrival time 1:20 with Danella Sensing, NP [place] [facility]

## 2021-06-27 ENCOUNTER — Telehealth: Payer: Self-pay

## 2021-06-27 NOTE — Telephone Encounter (Signed)
Sharyn Lull, OT called to get verbal orders for OT. Once a week for a week, 2x/week for 3 weeks, once a week for 2 weeks. Verbal orders given

## 2021-06-29 DIAGNOSIS — Z09 Encounter for follow-up examination after completed treatment for conditions other than malignant neoplasm: Secondary | ICD-10-CM | POA: Diagnosis not present

## 2021-06-29 DIAGNOSIS — I10 Essential (primary) hypertension: Secondary | ICD-10-CM | POA: Diagnosis not present

## 2021-06-29 DIAGNOSIS — F1320 Sedative, hypnotic or anxiolytic dependence, uncomplicated: Secondary | ICD-10-CM | POA: Diagnosis not present

## 2021-06-29 DIAGNOSIS — Z8679 Personal history of other diseases of the circulatory system: Secondary | ICD-10-CM | POA: Diagnosis not present

## 2021-06-29 DIAGNOSIS — Z299 Encounter for prophylactic measures, unspecified: Secondary | ICD-10-CM | POA: Diagnosis not present

## 2021-07-03 ENCOUNTER — Telehealth: Payer: Self-pay | Admitting: *Deleted

## 2021-07-03 NOTE — Telephone Encounter (Signed)
Shelby Hill is asking for a call prior to the visit scheduled tomorrow. I spoke with him and he is wanting [place] to allow him and his daughter in the room for the visit. Also he wants the therapies continued (emphasize this), particularly speech even though patient is saying she does not need any of the therapies.

## 2021-07-03 NOTE — Telephone Encounter (Signed)
Mr. Lewison called again and he wants to let Zella Ball know that in the visit mention that how important it is for patient to continue to use the transfer belt and listen to the patient concerns.

## 2021-07-04 ENCOUNTER — Encounter: Payer: Medicare PPO | Admitting: Registered Nurse

## 2021-07-05 NOTE — Telephone Encounter (Signed)
Return Shelby Hill call, he verbalizes understanding.

## 2021-07-10 ENCOUNTER — Telehealth: Payer: Self-pay | Admitting: *Deleted

## 2021-07-10 ENCOUNTER — Encounter: Payer: Medicare PPO | Attending: Registered Nurse | Admitting: Registered Nurse

## 2021-07-10 ENCOUNTER — Other Ambulatory Visit: Payer: Self-pay

## 2021-07-10 ENCOUNTER — Encounter: Payer: Self-pay | Admitting: Registered Nurse

## 2021-07-10 VITALS — BP 125/73 | HR 68 | Ht 66.0 in | Wt 167.2 lb

## 2021-07-10 DIAGNOSIS — I613 Nontraumatic intracerebral hemorrhage in brain stem: Secondary | ICD-10-CM

## 2021-07-10 DIAGNOSIS — I610 Nontraumatic intracerebral hemorrhage in hemisphere, subcortical: Secondary | ICD-10-CM

## 2021-07-10 DIAGNOSIS — G500 Trigeminal neuralgia: Secondary | ICD-10-CM | POA: Insufficient documentation

## 2021-07-10 DIAGNOSIS — G250 Essential tremor: Secondary | ICD-10-CM | POA: Diagnosis not present

## 2021-07-10 DIAGNOSIS — R4189 Other symptoms and signs involving cognitive functions and awareness: Secondary | ICD-10-CM | POA: Insufficient documentation

## 2021-07-10 DIAGNOSIS — F05 Delirium due to known physiological condition: Secondary | ICD-10-CM | POA: Insufficient documentation

## 2021-07-10 MED ORDER — PROPRANOLOL HCL 10 MG PO TABS: 10.0000 mg | ORAL_TABLET | Freq: Two times a day (BID) | ORAL | 0 refills | Status: DC

## 2021-07-10 MED ORDER — QUETIAPINE FUMARATE 25 MG PO TABS: 25.0000 mg | ORAL_TABLET | Freq: Two times a day (BID) | ORAL | 0 refills | Status: DC

## 2021-07-10 NOTE — Progress Notes (Signed)
Subjective:    Patient ID: Shelby Hill, female    DOB: March 14, 1936, 86 y.o.   MRN: [id number]  HPI: Shelby Hill is a 86 y.o. female who  is here for Transitional Care Visit for Follow up of her Intracerebral hemorrhage, Trigeminal Neuralgia, Sundowning, Essential Tremor and Cognitive Changes.  She was brought to [facility] ED via ambulance on 06/04/2021 as code stroke: H&P Dr. Tobie Poet  on 06/04/2021    Shelby Hill is a 86 y.o. female with past medical history significant for trigeminal neuralgia, hyperlipidemia who presents as a code stroke.  The patient was reportedly receiving a facial when the technician thought she was confused and answering questions inappropriately.  EMS was called and the patient was brought to [facility] ED as a code stroke.  According to the patient's husband and daughter at bedside, she was in her usual state of health this morning.   CT Head: WO Contrast: IMPRESSION: 1. Intraparenchymal hemorrhage, centered in the left basal ganglia, with mild surrounding edema but without significant mass effect or midline shift. 2. Intraventricular extension into the left lateral ventricle, third ventricle, and fourth ventricle. No hydrocephalus.  MRI: MRA: IMPRESSION: MRI HEAD IMPRESSION:   1. Motion degraded exam. 2. Grossly stable appearance of intraparenchymal hemorrhage centered at the anterior left basal ganglia with surrounding vasogenic edema and mild regional mass effect. No visible underlying lesion on this motion degraded noncontrast MRI. 3. Intraventricular extension with blood within the left greater than right lateral ventricles. Stable ventricular size and morphology without progressive hydrocephalus or trapping. 4. No other acute intracranial abnormality.   MRA HEAD IMPRESSION:   1. Technically limited exam due to extensive motion artifact. 2. Grossly negative intracranial MRA, with no large vessel occlusion, hemodynamically significant stenosis, or  other acute vascular abnormality. No visible vascular abnormality seen underlying the left basal ganglia hemorrhage. No visible aneurysm.   Neurology Consulted.   [name] was admitted to inpatient Rehabilitation on 06/07/2021 and discharged home on 06/22/2021. She is receiving Home Health Therapy with [facility]. She denies any pain. She rates her pain 0. Also reports she has a good appetite.   Husband and Daughter at Bedside, all questions answered.   Pain Inventory Average Pain 0 Pain Right Now 0 My pain is intermittent  LOCATION OF PAIN  Left knee  BOWEL Number of stools per week: 6-7    BLADDER Normal and Pads   Mobility walk without assistance walk with assistance use a cane use a walker how many minutes can you walk? 5-10 ability to climb steps?  yes do you drive?  yes  Function retired I need assistance with the following:  meal prep and household duties  Neuro/Psych trouble walking confusion  Prior Studies Any changes since last visit?  no  Physicians involved in your care Any changes since last visit?  no   Family History  Problem Relation Age of Onset   CAD Mother    Stroke Sister    Stroke Brother    Social History   Socioeconomic History   Marital status: Married    Spouse name: Not on file   Number of children: Not on file   Years of education: Not on file   Highest education level: Not on file  Occupational History   Not on file  Tobacco Use   Smoking status: Never   Smokeless tobacco: Never  Vaping Use   Vaping Use: Never used  Substance and Sexual Activity  Alcohol use: Yes    Comment: Occasionally   Drug use: No   Sexual activity: Not Currently  Other Topics Concern   Not on file  Social History Narrative   ** Merged History Encounter **    Right handed   Caffeine~ 1-2 cups per day    Social Determinants of Health   Financial Resource Strain: Not on file  Food Insecurity: Not on file  Transportation  Needs: Not on file  Physical Activity: Not on file  Stress: Not on file  Social Connections: Not on file   Past Surgical History:  Procedure Laterality Date   COLONOSCOPY  01/01/2012   Procedure: COLONOSCOPY;  Surgeon: Rogene Houston, MD;  [facility];  Service: Endoscopy;  Laterality: N/A;  830   INTRAMEDULLARY (IM) NAIL INTERTROCHANTERIC Left 04/26/2017   Procedure: INTRAMEDULLARY (IM) NAIL INTERTROCHANTRIC;  Surgeon: Leandrew Koyanagi, MD;  Location: [place];  Service: Orthopedics;  Laterality: Left;   KNEE SURGERY     ORIF TIBIA PLATEAU     Past Medical History:  Diagnosis Date   BCC (basal cell carcinoma) 02/09/1988   left upper thigh,left lower thigh tx cx3 49fu   BCC (basal cell carcinoma) 10/04/1962   left clavicle,left breast, Back   BCC (basal cell carcinoma) 03/06/1989   mid back   BCC (basal cell carcinoma) 04/02/1990   left upper shoulder (CX35FU),left upper breast(CX35FU),above left clavicle, right outer back,   BCC (basal cell carcinoma) 04/01/1991   right cetner outer brown (CX3+exc. ), upper left chest,upper left chest medial, right back   BCC (basal cell carcinoma) 02/01/1994   right back   BCC (basal cell carcinoma) 03/26/1995   central upper back (CX35FU),left upper back (CX35FU)   BCC (basal cell carcinoma) 12/30/1997   right cheek (exc. )   BCC (basal cell carcinoma) 08/06/1999   upper right shin (CX35FU), upper right shin (CX35FU)   BCC (basal cell carcinoma) 08/24/1998   left breast (CX35FU)   BCC (basal cell carcinoma) 08/05/2001   right inner cheek (MOHS), Left upper back (CX35FU),right upper back (CX35FU),, right top hand (CX35FU)   BCC (basal cell carcinoma) 08/02/2002   upper back(CX35FU)   BCC (basal cell carcinoma) 09/12/2003   right mid back (CX35FU), right thigh (deep freeze+aldara) Left thigh outer (deep freeze +aldara)   BCC (basal cell carcinoma) 06/03/2005   rigth scapula (CX35FU),    BCC (basal cell carcinoma) 06/05/2006   right  ant. 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 (CX35FU)   SCC (squamous cell carcinoma) 06/05/2006   mid chest (CX35FU)   SCC (squamous cell carcinoma) 08/08/2010   left temple (CX35FU), Left upper arm (CX35FU), right forearm/wrist (CX35FU)   SCC (squamous cell carcinoma) 02/06/2012   Left post neck-tx p bx   SCC (squamous cell carcinoma) 11/09/2012   left hand-tx p bx-, left chest sup -tx p bx, left chest inf. (CX35FU)  SCC (squamous cell carcinoma) 08/04/2014   left clavicle-tx p bx   SCC (squamous cell carcinoma) 11/30/2014   lower right leg distal (CX35FU), lower right leg, prox. (CX35FU)   SCC (squamous cell carcinoma) 05/17/2015   right jawline (CX35FU), Left sideburn (CX35FU)   SCC (squamous cell carcinoma) 09/10/2017   left forearm-tx p bx, left temple -tx p bx   BP 125/73    Pulse 68    Ht 5\' 6"  (1.676 m)    Wt 167 lb 3.2 oz (75.8 kg)    SpO2 95%    BMI 26.99 kg/m   Opioid Risk Score:   Fall Risk Score:  `1  Depression screen PHQ 2/9  Depression screen PHQ 2/9 07/10/2021  Decreased Interest 0  Down, Depressed, Hopeless 0  PHQ - 2 Score 0  Altered sleeping 0  Tired, decreased energy 0  Change in appetite 0  Feeling bad or failure about yourself  0  Trouble concentrating 1  Moving slowly or fidgety/restless 1  Suicidal thoughts 0  PHQ-9 Score 2  Difficult doing work/chores Not difficult at all     Review of Systems   Constitutional: Negative.   HENT: Negative.    Eyes: Negative.   Respiratory: Negative.    Cardiovascular: Negative.   Gastrointestinal: Negative.   Endocrine: Negative.   Genitourinary: Negative.   Musculoskeletal:  Positive for gait problem.  Skin: Negative.   Allergic/Immunologic: Negative.   Hematological: Negative.   Psychiatric/Behavioral:  Positive for confusion.       Objective:   Physical Exam Vitals and nursing note reviewed.  Constitutional:      Appearance: Normal appearance.  Cardiovascular:     Rate and Rhythm: Normal rate and regular rhythm.     Pulses: Normal pulses.     Heart sounds: Normal heart sounds.  Pulmonary:     Effort: Pulmonary effort is normal.     Breath sounds: Normal breath sounds.  Musculoskeletal:     Cervical back: Normal range of motion and neck supple.     Comments: Normal Muscle Bulk and Muscle Testing Reveals:  Upper Extremities: Full ROM and Muscle Strength 5/5  Lower Extremities: Right: Full ROM and Muscle Strength 5/5 Left Lower Extremity: Decreased ROM and Muscle Strength 5/5 Left Lower Extremity Flexion Produces Pain into Left Patella Arises from Table Slowly using cane for support Narrow Based Gait     Skin:    General: Skin is warm and dry.  Neurological:     Mental Status: She is alert and oriented to person, place, and time.  Psychiatric:        Mood and Affect: Mood normal.        Behavior: Behavior normal.         Assessment & Plan:  Intracerebral hemorrhage: Trigeminal Neuralgia,: Continue current medication regimen. Neurology Following. Continue to Monitor.   Sundowning: Continue Seroquel: Continue to Monitor.   Essential Tremor: Continue Inderal: Continue to Monitor  Cognitive Changes. RX: Referral Dr Sima Matas. Continue to Monitor.   F/U with Dr Su Hilt in 4- 6 weeks

## 2021-07-10 NOTE — Telephone Encounter (Signed)
Daughter called with concerns voiced by ST to them about her mother's cognitive decline. She is asking that Shelby Hill discuss with patient at the appt today from her approach instead of it being something the family is bringing up. I was having hard time following what was being asked so I got the name of the ST and spoke with her, (Shelby Hill ST) It has been noted that Shelby Hill has severe cognitive decline and she feels she is in need of a complete neuro cognitive evaluation and testing to see if dementia is a correct diagnosis. I told her we have neuro psych on site and could recommend she see him. He is booked out months but fortunately he had a opening March 20 @3  pm and we have put her in as a new patient. I will place the referral to him in the system. I have let the therapist and the daughter know.

## 2021-07-18 ENCOUNTER — Other Ambulatory Visit: Payer: Self-pay | Admitting: Neurology

## 2021-07-20 ENCOUNTER — Telehealth: Payer: Self-pay | Admitting: Physical Medicine & Rehabilitation

## 2021-07-20 NOTE — Telephone Encounter (Signed)
Prescriptions sent in on 07/10/21, pharmacy has prescriptions, pt husband aware.

## 2021-07-20 NOTE — Telephone Encounter (Signed)
Patient needs a refill on Seroquel and Inderal.  Please call patient when this is done.

## 2021-08-13 ENCOUNTER — Other Ambulatory Visit: Payer: Self-pay

## 2021-08-13 ENCOUNTER — Encounter: Payer: Medicare PPO | Attending: Psychology | Admitting: Psychology

## 2021-08-13 DIAGNOSIS — R4189 Other symptoms and signs involving cognitive functions and awareness: Secondary | ICD-10-CM | POA: Insufficient documentation

## 2021-08-13 DIAGNOSIS — R413 Other amnesia: Secondary | ICD-10-CM | POA: Diagnosis not present

## 2021-08-13 DIAGNOSIS — I613 Nontraumatic intracerebral hemorrhage in brain stem: Secondary | ICD-10-CM | POA: Insufficient documentation

## 2021-08-14 ENCOUNTER — Encounter: Payer: Self-pay | Admitting: Psychology

## 2021-08-14 NOTE — Progress Notes (Signed)
Neuropsychological Consultation ? ? ?Patient:   Shelby Hill  ? ?DOB:   [birth year] ? ?MR Number:  [id number] ? ?Location:  [facility] ?[place] [place] ?[id number] ?[place] [address] ?Dept: [phone] ?          ?Date of Service:   08/13/2021 ? ?Start Time:   3 PM ?End Time:   5 PM ? ?Today's visit was an in person visit as conducted in my outpatient clinic office.  The patient, her husband and and oldest son were present along with myself for this interview.  1 hour and 15 minutes was spent in face-to-face clinical interview and the other 45 minutes was spent with records review, report writing and setting up testing protocols. ? ?Provider/Observer:  Ilean Skill, Psy.D.   ?    Clinical Neuropsychologist ?     ? ?Billing Code/Service: [id number] ? ?Chief Complaint:    Shelby Hill is an 86 year old female referred by Alger Simons, MD for neuropsychological evaluation as part of her overall follow-up rehabilitation care from her recent admission to the comprehensive inpatient rehabilitation program with [facility].  The patient has also been followed by [name], NP and [name], NP with [facility] after being seen by Dr. 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  The patient was admitted to the CIR program on 06/07/2020 after her ED to hospital admission for a Intraparenchymal hemorrhage of the brain.  On 06/04/2021 the patient was receiving a facial in the afternoon when the technician noted confusion and the patient answering questions inappropriately.  EMS was called and she was transported to the [facility] health ED as code stroke.  Husband and son had noted no mental status change earlier in the day.  CT of head was significant for left basal ganglia hemorrhage with mild surrounding edema but no significant mass effect or midline shift.  Attempted MRI were  limited due to motion degradation.  Patient is now returned home and being assisted by her husband and oldest son.  The patient has continued with motor deficits around walking and increasing difficulties with cognition poststroke although there were issues prior to the stroke. ? ?Reason for Service:  Shelby Hill is an 86 year old female referred by Alger Simons, MD for neuropsychological evaluation as part of her overall follow-up rehabilitation care from her recent admission to the comprehensive inpatient rehabilitation program with [facility].  The patient has also been followed by [name], NP and [name], NP with [facility] after being seen by Dr. 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  The patient was admitted to the CIR program on 06/07/2020 after her ED to hospital admission for a Intraparenchymal hemorrhage of the brain.  On 06/04/2021 the patient was receiving a facial in the afternoon when the technician noted confusion and the patient answering questions inappropriately.  EMS was called and she was transported to the [facility] health ED as code stroke.  Husband and son had noted no mental status change earlier in the day.  CT of head was significant for left basal ganglia hemorrhage with mild surrounding edema but no significant mass effect or midline shift.  Attempted MRI were limited due to motion degradation.  Patient is now returned home and being assisted by her husband and oldest son.  The patient has continued with motor deficits around walking and increasing difficulties with cognition poststroke although  there were issues prior to the stroke. ? ?The patient primarily discusses issues since her stroke and minimizes other cognitive difficulties both after or before the stroke.  She reports that she is having more issues efficiently dressing and takes more time to get ready.  She describes most of her issues and difficulties with left-sided weakness and  coordination issues.  She acknowledges some memory issues but downplays memory issues besides some word finding problems.  Patient denies any geographic disorientation.  Patient's husband reports that he has noticed her being more unsure about current events and regularly ask questions and repeats questions about who is coming to visit and why they are visiting.  Repeated questions as noted.  Her husband denies any geographic disorientation's and no changes in her long-term memory but she still has a slight tremor in her left hand and sometimes in her left leg.  Patient does not typically use her cane inside although it is being encouraged by her family the patient is hesitant and resisting towards using cane in many situations.  Patient has participated in therapeutic exercises including PT and OT.  Family has gotten a Actuary during the day through [facility] but patient has been resistant towards that.  There have been changes in handwriting even before this stroke.  Patient's son describes some occasional word finding issues and memory issues and that the symptoms were present even before her CVA.  Was also noted that the patient has been sleeping later into the day more recently that may be medication related.  Memory and word finding issues are noted. ? ?I had an opportunity to communicate with the patient's son after our in person visit.  He reports that the patient had been seen by speech therapy with concerns about how the patient might have been doing even before the stroke.  There were some "strange behaviors" noted by multiple people including speech therapy and family as well as friends interacting with her outside of the home.  There were indications of cognitive changes and cognitive loss prior to the stroke.  Patient's son acknowledges that the patient will often minimize any difficulties or appeared to be unaware of them.  There were clear changes in cognitive function even before her stroke.    ? ?While the patient had been followed by neurology due to significant facial pain cognition was noted in most notes as the patient generally being oriented x4 and alert.  The patient had been seen by Dr. [name] in 2020/2021 due to left V1 distribution sharp facial pains.  Patient did not respond well to Lyrica and tried on lamotrigine with significant positive response.  Patient had developed a slight tremor on Lamictal.  It was also noted that she had a slight increase in instability with walking on Lyrica without fall. ? ?While recent attempts following her cerebrovascular event to obtain an MRI were generally unsuccessful due to motion degradation CT scans did identify hematoma below the left caudate head measuring 2.5 cm.  There was also noted underlying chronic small vessel ischemic and cerebral volume loss. ? ?Earlier MRI conducted in August 2020 did have an impression/interpretation including T2/FLAIR hyperintense foci in the hemispheres with confluence in the parietal white matter consistent with moderate chronic microvascular ischemic change.  There were at least a dozen chronic microhemorrhages in the subcortical white matter noted and no microhemorrhages noted in deep gray matter or posterior fossa.  It was felt the distribution could be consistent with an incidental finding, mild cerebral amyloid  angiopathic or prior trauma. ? ?The family does note that there is an extensive family history of cerebrovascular events including sudden stroke and other cerebrovascular events with other family members. ? ?Sleep is reported currently to be 10 to 12 hours/day and prior to the stroke patient had some episodic sleep difficulties. ? ? ?Behavioral Observation: Shelby Hill  presents as a 86 y.o.-year-old Right handed Caucasian Female who appeared her stated age. her dress was Appropriate and she was Well Groomed and her manners were Appropriate to the situation.  her participation was indicative of Resistant  behaviors.  There were physical disabilities noted.  she displayed an appropriate level of cooperation and motivation.   ? ? ?Interactions:    Active Inattentive, Redirectable, and Resistant ? ?Attention:   abnorm

## 2021-08-20 ENCOUNTER — Other Ambulatory Visit: Payer: Self-pay | Admitting: Registered Nurse

## 2021-08-21 ENCOUNTER — Ambulatory Visit: Payer: Medicare PPO | Admitting: Dermatology

## 2021-08-28 ENCOUNTER — Other Ambulatory Visit: Payer: Self-pay

## 2021-08-28 NOTE — Patient Outreach (Signed)
[facility]) Care Management ? ?08/28/2021 ? ?Shelby Hill ?10-06-1935 ?[id number] ? ? ?First telephone outreach attempt to obtain mRS. No answer. Left message for returned call. ? ?Philmore Pali ?[facility]-Care Management Assistant ?[phone] ? ?

## 2021-09-04 ENCOUNTER — Telehealth: Payer: Self-pay

## 2021-09-04 ENCOUNTER — Encounter: Payer: Medicare PPO | Attending: Psychology

## 2021-09-04 DIAGNOSIS — R413 Other amnesia: Secondary | ICD-10-CM | POA: Diagnosis not present

## 2021-09-04 DIAGNOSIS — I613 Nontraumatic intracerebral hemorrhage in brain stem: Secondary | ICD-10-CM | POA: Diagnosis present

## 2021-09-04 DIAGNOSIS — I6782 Cerebral ischemia: Secondary | ICD-10-CM | POA: Insufficient documentation

## 2021-09-04 DIAGNOSIS — F09 Unspecified mental disorder due to known physiological condition: Secondary | ICD-10-CM | POA: Diagnosis present

## 2021-09-04 DIAGNOSIS — I69118 Other symptoms and signs involving cognitive functions following nontraumatic intracerebral hemorrhage: Secondary | ICD-10-CM | POA: Insufficient documentation

## 2021-09-04 DIAGNOSIS — I69111 Memory deficit following nontraumatic intracerebral hemorrhage: Secondary | ICD-10-CM | POA: Insufficient documentation

## 2021-09-04 MED ORDER — QUETIAPINE FUMARATE 25 MG PO TABS: 25.0000 mg | ORAL_TABLET | Freq: Two times a day (BID) | ORAL | 1 refills | Status: DC

## 2021-09-04 NOTE — Progress Notes (Signed)
? ?Behavioral Observations ?The patient appeared well-groomed and appropriately dressed. Her manners were polite and appropriate to the situation. The patient initially demonstrated a negative attitude toward testing saying "I don't want to be here" but she quickly developed a more neutral attitude toward testing. She was compliant with all testing instructions and appeared to show good effort. Word finding difficulty was observed and the patient's speech was often incoherent. The patient had significant difficulty understanding testing instructions as well as difficulty switching between tasks. At the conclusion of testing the patient expressed concerns about how her tests were going to be scored and used.   ? ?Neuropsychology Note ? ?Shelby Hill completed 180 minutes of neuropsychological testing with technician, Shelby Hill, BA, under the supervision of Shelby Skill, PsyD., Clinical Neuropsychologist. The patient did not appear overtly distressed by the testing session, per behavioral observation or via self-report to the technician. Rest breaks were offered.  ? ?Clinical Decision Making: In considering the patient's current level of functioning, level of presumed impairment, nature of symptoms, emotional and behavioral responses during clinical interview, level of literacy, and observed level of motivation/effort, a battery of tests was selected by Dr. Sima Hill during initial consultation on 08/13/2021. This was communicated to the technician. Communication between the neuropsychologist and technician was ongoing throughout the testing session and changes were made as deemed necessary based on patient performance on testing, technician observations and additional pertinent factors such as those listed above. ? ?Tests Administered: ?Controlled Oral Word Association Test (COWAT; FAS & Animals)  ?Finger Tapping Test (FTT) ?Grooved Pegboard ?Wechsler Adult Intelligence Scale, 4th Edition (WAIS-IV) ?Wechsler  Memory Scale, 4th Edition (WMS-IV); Adult Battery or Older Adult Battery  ? ?Results: ? ?Grooved Pegboard ?Right Hand ?Time = 5:59.16 ?3 rule violations ?3 drops ?Left Hand ?Time = 5:10.48 ?0 rule violations ?3 drops ? ? ? ? ? ?COWAT ?FAS total = 13 ?Z = -2.14 ?Animals total = 3 ?Z = -3.10 ? ? ? ? ? ? ?Finger Tapping Test ?Right Hand ?15 ?18 ?9 ?16 ?20 ?Left Hand ?11 ?18 ?13 ?12 ?12 ? ? ? ? ? ?WAIS-IV ? ?Composite Score Summary  ?Scale Sum of ?Scaled Scores Composite ?Score Percentile ?Rank 95% Conf. ?Interval Qualitative Description  ?Verbal Comprehension 14 VCI 70 2 66-77 Borderline  ?Perceptual Reasoning 11 PRI 63 1 59-71 Extremely Low  ?Working Memory 13 WMI 80 9 74-88 Low Average  ?Processing Speed 5 PSI 59 0.3 55-71 Extremely Low  ?Full Scale 43 FSIQ 62 1 59-67 Extremely Low  ?General Ability 25 GAI 63 1 59-69 Extremely Low  ? ? ? ? ? ?Verbal Comprehension Subtests Summary  ?Subtest Raw Score Scaled Score Percentile Rank Reference Group Scaled Score SEM  ?Similarities 2 1 0.1 1 0.90  ?Vocabulary '27 8 25 8 '$ 0.73  ?Information '4 5 5 5 '$ 0.73  ?(Comprehension) '10 5 5 4 '$ 1.08  ? ? ? ? ? ? ?Perceptual Reasoning Subtests Summary  ?Subtest Raw Score Scaled Score Percentile Rank Reference Group Scaled Score SEM  ?Matrix Reasoning '5 6 9 2 '$ 1.12  ?Visual Puzzles 0 1 0.1 1 1.27  ? ? ? ? ? ? ?Working Wellsite geologist  ?Subtest Raw Score Scaled Score Percentile Rank Reference Group Scaled Score SEM  ?Digit Span '16 6 9 4 '$ 0.85  ?Arithmetic '8 7 16 5 '$ 0.99  ? ? ? ? ? ? ?Processing Speed Subtests Summary  ?Subtest Raw Score Scaled Score Percentile Rank Reference Group Scaled Score SEM  ?Symbol Search 0 1  0.1 1 1.12  ?Coding '10 4 2 1 '$ 1.12  ? ? ? ? ? ? ? ? ? ?WMS-IV Older Adult ? ?Index Score Summary  ?Index Sum of Scaled Scores Index Score Percentile ?Rank 95% Confidence ?Interval Qualitative Descriptor  ?Auditory Memory (AMI) 32 88 21 82-95 Low Average  ?Visual Memory (VMI) 4 50 <0.1 47-56 Extremely Low  ?Immediate Memory  (IMI) 16 71 3 66-79 Borderline  ?Delayed Memory (DMI) 20 79 8 73-88 Borderline  ? ? ? ? ? ?Primary Subtest Scaled Score Summary  ?Subtest Domain Raw Score Scaled Score Percentile Rank  ?Logical Memory I AM '18 7 16  '$ ?Logical Memory II AM '6 7 16  '$ ?Verbal Paired Associates I AM '14 8 25  '$ ?Verbal Paired Associates II AM 5 10 50  ?Visual Reproduction I VM 6 1 0.1  ?Visual Reproduction II VM 0 3 1  ?Symbol Span VWM 15 11 63  ? ? ? ? ? ?Auditory Memory Process Score Summary  ?Process Score Raw Score Scaled Score Percentile Rank Cumulative Percentage ?(Base Rate)  ?LM II Recognition 16 - - 26-50%  ?VPA II Recognition 22 - - 10-16%  ? ? ? ? ? ? ?Visual Memory Process Score Summary  ?Process Score Raw Score Scaled Score Percentile Rank Cumulative Percentage ?(Base Rate)  ?VR II Recognition 5 - - >75%  ? ? ? ? ? ?ABILITY-MEMORY ANALYSIS ? ?Ability Score:  VCI: 70 ?Date of Testing:  WAIS-IV; WMS-IV 2021/09/04 ? ?Predicted Difference Method   ?Index Predicted ?WMS-IV ?Index Score Actual ?WMS-IV ?Index Score Difference Critical Value ? Significant ?Difference ?Y/N Base Rate  ?Auditory Memory 84 88 -4 9.14 N   ?Visual Memory 87 50 37 7.30 Y <1%  ?Immediate Memory 83 71 12 10.12 Y 15-20%  ?Delayed Memory 85 79 6 10.62 N   ?Statistical significance (critical value) at the .01 level.  ? ? ? ? ? ? ? ? ?Feedback to Patient: ?Shelby Hill will return on 10/02/2021 for an interactive feedback session with Dr. Sima Hill at which time her test performances, clinical impressions and treatment recommendations will be reviewed in detail. The patient understands she can contact our office should she require our assistance before this time. ? ?180 minutes spent face-to-face with patient administering standardized tests, 30 minutes spent scoring (technician). [CPT [id number], 98921] ? ?Full report to follow.  ?

## 2021-09-04 NOTE — Telephone Encounter (Signed)
Patient's husband in clinic requesting refill on Seroquel ?

## 2021-09-10 NOTE — Progress Notes (Signed)
? ? ?Patient: Shelby Hill ?Date of Birth: Aug 06, 1935 ? ?Reason for Visit: Post-stroke clinic visit: ICH Left BG with IVH 06/04/2021 ?History from: Patient, husband ?Primary Neurologist: Saw Dr. Erlinda Hong in [facility]; former patient Dr. [name] for SUNCT headache left V1; Dr. Billey Gosling can now be primary neurologist for headache ? ?ASSESSMENT AND PLAN ?86 y.o. year old female  ? ?1.  Intracranial hemorrhage: Left basal ganglia intraparenchymal hemorrhage likely secondary to hypertension ?-On aspirin 81 mg daily prior to admission, now no antithrombotic given ICH, no known history of prior CVA ?-85% back to baseline, mild word finding trouble  ?-Any further acute stroke symptoms, go to ER immediately  ? ?2.  Hypertension ?-Goal normotensive ?-On propanolol, near goal, closely monitor ? ?3.  Hyperlipidemia ?-LDL 56, on Crestor 5 mg ? ?4.  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 ?5.  Cognitive impairment ?-Has undergone neuropsych evaluation, results are pending ? ?6.  Sundowning ?-Much improved, reduce Seroquel back to 25 mg at bedtime, will follow up with Dr. Naaman Plummer in May consider if can be eliminated ? ?HISTORY OF PRESENT ILLNESS: ?Today 09/11/21 ?Shelby Hill is here today for stroke clinic follow-up from admission 06/04/21, was seen a facial in a salon, right facial droop, garbled speech, confusion.  CT Imaging showed left basal ganglia intraparenchymal hemorrhage centered in the left basal ganglia with mild surrounding edema without significant mass effect or midline shift.  MRI of the brain showed similar stable findings.  MRA showed no LVO.  LDL 56, A1c 5.5.  Was on aspirin 81 mg daily prior to admission. Deficits of mild right facial droop, transcortical expressive aphasia.  Discharged to inpatient rehab 06/07/21.  Has seen Dr. Sima Matas for formal neuro psych eval, but results are pending. ? ?Here with husband, he was in the lobby, but I brought him back. She doesn't enjoy the fuss her family is making. Has some continued word finding trouble, but people think her speech is clearer. Since CVA, no further left V1 headache pain. Some confusion with time relationships. Does her own ADLs. Using cane, no falls. Sleeping well, on Seroquel 4 PM and bedtime. Off aspirin and Xanax since the stroke. About 85% back to normal, only deficit is mild word finding. Tremor comes and goes to hands, only notes during anxiety/stress. No longer needs home health.  ? ?HISTORY  ?Dr. Cheral Marker 06/04/2021 HPI: Shelby Hill is an 86 y.o. female with a PMHx of basal cell carcinoma, squamous cell carcinoma, trigeminal neuralgia and HLD who presents to the ED via EMS with acute onset of right facial droop, garbled speech and confusion. LKN was 1:30 PM while getting a facial at a salon. Staff then noticed patient to be acutely confused and answering questions inappropriately. Technician thought that she was talking in her sleep during the facial; after the facial was done, she contacted the patient's husband. EMS was called and on arrival they noted slurred speech and confusion. Code Stroke was called en route to the hospital. The patient is not on any blood thinners. ? ?06/07/2021 Dr. Erlinda Hong:  ?Patient was admitted on 1/9 with right sided facial droop, garbled speech and confusion.  She had been getting a facial at the salon and was noted to have become confused.  EMS was called, and patient was taken  to the ER.  She was found to have a left basal ganglia IPH with IVH, likely secondary to hypertension.  Repeat CT showed a stable IPH with no additional bleeding, and MRI demonstrated no vascular abnormality underlying the IPH.  Patient was able to be moved out of the ICU on 1/11 and was ready for discharge to CIR on 1/12. ? ?REVIEW OF SYSTEMS: Out of a complete 14  system review of symptoms, the patient complains only of the following symptoms, and all other reviewed systems are negative. ? ?See HPI ? ?ALLERGIES: ?Allergies  ?Allergen Reactions  ? Codeine Nausea And Vomiting  ? ? ?HOME MEDICATIONS: ?Outpatient Medications Prior to Visit  ?Medication Sig Dispense Refill  ? acetaminophen (TYLENOL) 325 MG tablet Take 1-2 tablets (325-650 mg total) by mouth every 4 (four) hours as needed for mild pain.    ? ALPRAZolam (XANAX) 0.5 MG tablet Take 1 tablet (0.5 mg total) by mouth at bedtime as needed for anxiety or sleep. May use for agitation in the afternoon if Seroquel not effective. 30 tablet 0  ? calcitonin, salmon, (MIACALCIN/FORTICAL) 200 UNIT/ACT nasal spray Place 1 spray into alternate nostrils daily.    ? DULoxetine (CYMBALTA) 60 MG capsule TAKE 1 CAPSULE BY MOUTH DAILY (Patient taking differently: 60 mg daily.) 30 capsule 5  ? Ensure Max Protein (ENSURE MAX PROTEIN) LIQD Take 330 mLs (11 oz total) by mouth daily.    ? lamoTRIgine (LAMICTAL) 25 MG tablet TAKE 1 TABLET BY MOUTH TWICE DAILY 60 tablet 5  ? pregabalin (LYRICA) 25 MG capsule Take 1 capsule (25 mg total) by mouth 2 (two) times daily. 180 capsule 1  ? propranolol (INDERAL) 10 MG tablet TAKE 1 TABLET BY MOUTH TWICE DAILY 60 tablet 0  ? QUEtiapine (SEROQUEL) 25 MG tablet Take 1 tablet (25 mg total) by mouth 2 (two) times daily. Take one tablet at 4 pm and one at bedtime. 60 tablet 1  ? rosuvastatin (CRESTOR) 5 MG tablet Take 5 mg by mouth at bedtime.    ? ?No facility-administered medications prior to visit.  ? ? ?PAST MEDICAL HISTORY: ?Past Medical History:  ?Diagnosis Date  ? BCC (basal cell carcinoma) 02/09/1988  ? left upper thigh,left lower thigh tx cx3 84f  ? BCC (basal cell carcinoma) 10/04/1962  ? left clavicle,left breast, Back  ? BCC (basal cell carcinoma) 03/06/1989  ? mid back  ? BCC (basal cell carcinoma) 04/02/1990  ? left upper shoulder (CX35FU),left upper breast(CX35FU),above left clavicle, right  outer back,  ? BCC (basal cell carcinoma) 04/01/1991  ? right cetner outer brown (CX3+exc. ), upper left chest,upper left chest medial, right back  ? BCC (basal cell carcinoma) 02/01/1994  ? right back  ? BCC (basal cell carcinoma) 03/26/1995  ? central upper back (CX35FU),left upper back (CX35FU)  ? BCC (basal cell carcinoma) 12/30/1997  ? right cheek (exc. )  ? BCC (basal cell carcinoma) 08/06/1999  ? upper right shin (CX35FU), upper right shin (CX35FU)  ? BCC (basal cell carcinoma) 08/24/1998  ? left breast (CX35FU)  ? BCC (basal cell carcinoma) 08/05/2001  ? right inner cheek (MOHS), Left upper back (CX35FU),right upper back (CX35FU),, right top hand (CX35FU)  ? BCC (basal cell carcinoma) 08/02/2002  ? upper back(CX35FU)  ? BCC (basal cell carcinoma) 09/12/2003  ? right mid back (CX35FU), right thigh (deep freeze+aldara) Left thigh outer (deep freeze +aldara)  ? BCC (basal cell carcinoma) 06/03/2005  ? rigth scapula (CX35FU),   ? BCC (basal cell carcinoma) 06/05/2006  ?  right ant. lateral thigh (CX35FU)  ? BCC (basal cell carcinoma) 08/08/2010  ? right forearm (CX35FU)  ? BCC (basal cell carcinoma) 07/01/2011  ? right shin (MOHS)  ? BCC (basal cell carcinoma) 12/14/2013  ? left upper thigh(CX35FU), left upper arm (CX35FU), mid right back (CX35FU), lower right back (CX35FU)  ? BCC (basal cell carcinoma) 02/28/2016  ? left forearm  ? HLD (hyperlipidemia)   ? SCC (squamous cell carcinoma) 08/11/2000  ? left nose (MOHS)  ? SCC (squamous cell carcinoma) 09/12/2003  ? upper back (CX35FU)  ? SCC (squamous cell carcinoma) 06/03/2005  ? left v of neck (CX35FU), Left shin sup. (CX35FU)  ? SCC (squamous cell carcinoma) 06/05/2006  ? mid chest (CX35FU)  ? SCC (squamous cell carcinoma) 08/08/2010  ? left temple (CX35FU), Left upper arm (CX35FU), right forearm/wrist (CX35FU)  ? SCC (squamous cell carcinoma) 02/06/2012  ? Left post neck-tx p bx  ? SCC (squamous cell carcinoma) 11/09/2012  ? left hand-tx p bx-, left chest sup  -tx p bx, left chest inf. (CX35FU)  ? SCC (squamous cell carcinoma) 08/04/2014  ? left clavicle-tx p bx  ? SCC (squamous cell carcinoma) 11/30/2014  ? lower right leg distal (CX35FU), lower right leg, p

## 2021-09-11 ENCOUNTER — Encounter: Payer: Self-pay | Admitting: Neurology

## 2021-09-11 ENCOUNTER — Ambulatory Visit: Payer: Medicare PPO | Admitting: Neurology

## 2021-09-11 VITALS — BP 138/84 | HR 61 | Ht 66.0 in | Wt 167.0 lb

## 2021-09-11 DIAGNOSIS — G500 Trigeminal neuralgia: Secondary | ICD-10-CM

## 2021-09-11 DIAGNOSIS — I610 Nontraumatic intracerebral hemorrhage in hemisphere, subcortical: Secondary | ICD-10-CM

## 2021-09-11 NOTE — Patient Instructions (Addendum)
Cut back Seroquel to 1 tablet daily, watch the mood, trouble with sleeping ?Keep upcoming appointments  ?Keep BP around goal of 130/80 or less  ?Encouraged walking, exercise ?See you back 4-6 months  ? ? ?

## 2021-09-12 NOTE — Progress Notes (Signed)
I agree with the above plan 

## 2021-09-13 ENCOUNTER — Encounter (HOSPITAL_BASED_OUTPATIENT_CLINIC_OR_DEPARTMENT_OTHER): Payer: Medicare PPO | Admitting: Psychology

## 2021-09-13 ENCOUNTER — Other Ambulatory Visit: Payer: Self-pay

## 2021-09-13 ENCOUNTER — Encounter: Payer: Self-pay | Admitting: Psychology

## 2021-09-13 DIAGNOSIS — I613 Nontraumatic intracerebral hemorrhage in brain stem: Secondary | ICD-10-CM | POA: Diagnosis not present

## 2021-09-13 DIAGNOSIS — I69111 Memory deficit following nontraumatic intracerebral hemorrhage: Secondary | ICD-10-CM | POA: Diagnosis not present

## 2021-09-13 DIAGNOSIS — I6782 Cerebral ischemia: Secondary | ICD-10-CM | POA: Diagnosis not present

## 2021-09-13 DIAGNOSIS — I69118 Other symptoms and signs involving cognitive functions following nontraumatic intracerebral hemorrhage: Secondary | ICD-10-CM | POA: Diagnosis not present

## 2021-09-13 DIAGNOSIS — R413 Other amnesia: Secondary | ICD-10-CM | POA: Diagnosis not present

## 2021-09-13 DIAGNOSIS — F09 Unspecified mental disorder due to known physiological condition: Secondary | ICD-10-CM

## 2021-09-13 NOTE — Patient Outreach (Signed)
[facility]) Care Management ? ?09/13/2021 ? ?Ashwini Jago Parrilla ?08-28-1935 ?[id number] ? ? ?Second telephone outreach attempt to obtain mRS. No answer. Left message for returned call. ? ?Philmore Pali ?[facility]-Care Management Assistant ?[phone] ? ?

## 2021-09-13 NOTE — Progress Notes (Signed)
Neuropsychological Evaluation ? ? ?Patient:  Shelby Hill  ? ?DOB: 06/13/1935 ? ?MR Number: [id number] ? ?Location: [place] [facility] ?[address], [address] [place] ?[id number] ?[place] [address] ?Dept: [phone] ? ?Start: 8 AM ?End: 9 AM ? ?Provider/Observer:     Edgardo Roys PsyD ? ?Chief Complaint:      ?Chief Complaint  ?Patient presents with  ? Memory Loss  ? Cerebrovascular Accident  ? Tremors  ? Other  ?  Confabulatory responses  ? ? ?Reason For Service:      Shelby Hill is an 86 year old female referred by Alger Simons, MD for neuropsychological evaluation as part of her overall follow-up rehabilitation care from her recent admission to the comprehensive inpatient rehabilitation program with [facility].  The patient has also been followed by [name], NP and [name], NP with [facility] after being seen by Dr. 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  The patient was admitted to the CIR program on 06/07/2020 after her ED to hospital admission for a Intraparenchymal hemorrhage of the brain.  On 06/04/2021 the patient was receiving a facial in the afternoon when the technician noted confusion and the patient answering questions inappropriately.  EMS was called and she was transported to the [facility] health ED as code stroke.  Husband and son had noted no mental status change earlier in the day.  CT of head was significant for left basal ganglia hemorrhage with mild surrounding edema but no significant mass effect or midline shift.  Attempted MRI were limited due to motion degradation.  Patient has now returned home and being assisted by her husband and oldest son.  The patient has continued with motor deficits around walking and increasing difficulties with cognition poststroke, although there were issues prior to the stroke. ? ?The patient primarily discusses motor issues  since her stroke and minimizes other cognitive difficulties both before and after the stroke.  She reports that she is having more issues efficiently dressing and takes more time to get ready.  She describes most of her issues and difficulties with left-sided weakness and coordination issues.  She acknowledges some memory issues but downplays memory issues besides some word finding problems.  Patient denies any geographic disorientation.  Patient's husband reports that he has noticed her being more unsure about current events and regularly ask questions and repeats questions about who is coming to visit and why they are visiting.  Repeated questions as noted.  Her husband denies any geographic disorientation's and no changes in her long-term memory but she still has a slight tremor in her left hand and sometimes in her left leg.  Patient does not typically use her cane inside, although it is being encouraged by her family.  The patient is hesitant and resisting towards using cane in many situations.  Patient has participated in therapeutic exercises including PT and OT.  Family has gotten a Actuary during the day through [facility] services but patient has been resistant towards that.  There have been changes in handwriting even before this stroke.  Patient's son describes some occasional word finding issues and memory issues and that the symptoms were present even before her CVA.  Was also noted that the patient has been sleeping later into the day more recently that may be medication related.  Memory and word finding issues are noted. ? ?I had an opportunity to communicate individually with the patient's  son after our in person visit.  He reports that the patient had been seen by speech therapy with concerns about how the patient might have been doing even before the stroke.  There were some "strange behaviors" noted by multiple people including speech therapy and family as well as friends interacting with her outside  of the home.  There were indications of cognitive changes and cognitive loss prior to the stroke.  Patient's son acknowledges that the patient will often minimize any difficulties or appeared to be unaware of them.  There were clear changes in cognitive function even before her stroke.   ? ?While the patient had been followed by neurology due to significant facial pain, cognition was noted in most notes as the patient generally being oriented x4 and alert.  The patient had been seen by Dr. [name] in 2020/2021 due to left V1 distribution sharp facial pains.  Patient did not respond well to Lyrica and tried on lamotrigine with significant positive response.  Patient had developed a slight tremor on Lamictal.  It was also noted that she had a slight increase in instability with walking on Lyrica without fall. ? ?While recent attempts following her cerebrovascular event to obtain an MRI were generally unsuccessful due to motion degradation, CT scans did identify hematoma below the left caudate head measuring 2.5 cm.  There was also noted underlying chronic small vessel ischemic and cerebral volume loss. ? ?Earlier MRI conducted in August 2020 did have an impression/interpretation including T2/FLAIR hyperintense foci in the hemispheres with confluence in the parietal white matter consistent with moderate chronic microvascular ischemic change.  There were at least a dozen chronic microhemorrhages in the subcortical white matter noted and new microhemorrhages noted in deep gray matter or posterior fossa.  It was felt the distribution could be consistent with an incidental finding, mild cerebral amyloid angiopathic or prior trauma. ? ?The family does note that there is an extensive family history of cerebrovascular events including sudden stroke and other cerebrovascular events with other family members. ? ?Sleep is reported currently to be 10 to 12 hours/day and prior to the stroke patient had some episodic sleep  difficulties. ? ?Tests Administered: ?Controlled Oral Word Association Test (COWAT; FAS & Animals)  ?Finger Tapping Test (FTT) ?Grooved Pegboard ?Wechsler Adult Intelligence Scale, 4th Edition (WAIS-IV) ?Wechsler Memory Scale, 4th Edition (WMS-IV); Adult Battery or Older Adult Battery  ? ?Participation Level:   The patient was initially hesitant to initiate cognitive testing procedures but did agree and participated with a neutral attitude towards testing.  The patient had significant difficulty understanding various test instructions and tasks.  The patient expressed concerns about the nature and reason of testing and what they would be utilized for. ? ?Participation Quality:  Redirectable   ?   ?Behavioral Observation:  The patient appeared well-groomed and appropriately dressed. Her manners were polite and appropriate to the situation. The patient initially demonstrated a negative attitude toward testing saying "I don't want to be here" but she quickly developed a more neutral attitude toward testing. She was compliant with all testing instructions and appeared to show good effort. Word finding difficulty was observed and the patient's speech was often incoherent. The patient had significant difficulty understanding testing instructions as well as difficulty switching between tasks. At the conclusion of testing the patient expressed concerns about how her tests were going to be scored and used.  ? ?Well Groomed, Alert, and Constricted.  ? 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  We will conservatively estimate that the patient was likely functioning in the high average range relative to a normative population premorbidly and we will utilize a standard composite score of 115 or roughly 1 standard deviation above normative expectations for comparison purposes of current achieved  measures versus premorbid functioning.  The exception to this rule will be related primarily to fine motor control.  The patient very likely had some considerable premorbid variability in various cognitive test a

## 2021-09-17 ENCOUNTER — Other Ambulatory Visit: Payer: Self-pay | Admitting: Physical Medicine & Rehabilitation

## 2021-09-20 ENCOUNTER — Other Ambulatory Visit: Payer: Self-pay

## 2021-09-20 NOTE — Patient Outreach (Signed)
[facility]) Care Management ? ?09/20/2021 ? ?[place] ?22-Jan-1936 ?[id number] ? ? ?3 outreach attempts were completed to obtain mRs. mRs could not be obtained because patient never returned my calls. mRs=7 ?  ? ?Philmore Pali ?Care Management Assistant ?[phone] ? ?

## 2021-09-21 ENCOUNTER — Other Ambulatory Visit: Payer: Self-pay | Admitting: Neurology

## 2021-09-21 NOTE — Telephone Encounter (Signed)
Pt husband (on [place]) called concerning pregabalin (LYRICA) 25 MG capsule.  ?Would like to know if pt needs medication refilled or not. States this was discussed at appt 09/11/21, but pt does not recall if she needing refill.  ?[facility] faxed refill request of pregabalin (LYRICA) 25 MG capsule for pt to [facility] office Monday 09/17/2021.  ?Would like a call back from nurse.  ?

## 2021-09-24 MED ORDER — PREGABALIN 25 MG PO CAPS: 25.0000 mg | ORAL_CAPSULE | Freq: Two times a day (BID) | ORAL | 1 refills | Status: AC

## 2021-09-24 NOTE — Telephone Encounter (Signed)
Notes from 09/11/21 office visit: ? ?-For now, we will continue Lamictal, Cymbalta, Lyrica, reportedly do not need any refills, we will see her back in 4 to 6 months, if pain remains well controlled we will consider dose adjustment. ? ?_____________________________________ ?I spoke to the patient's husband. She is out of refills. Rx will be sent to the pharmacy.  ?

## 2021-09-26 ENCOUNTER — Ambulatory Visit: Payer: Medicare PPO | Admitting: Psychology

## 2021-10-02 ENCOUNTER — Encounter: Payer: Medicare PPO | Attending: Psychology | Admitting: Psychology

## 2021-10-02 DIAGNOSIS — I610 Nontraumatic intracerebral hemorrhage in hemisphere, subcortical: Secondary | ICD-10-CM | POA: Insufficient documentation

## 2021-10-02 DIAGNOSIS — F09 Unspecified mental disorder due to known physiological condition: Secondary | ICD-10-CM | POA: Insufficient documentation

## 2021-10-02 DIAGNOSIS — I6782 Cerebral ischemia: Secondary | ICD-10-CM | POA: Insufficient documentation

## 2021-10-02 DIAGNOSIS — R4701 Aphasia: Secondary | ICD-10-CM | POA: Insufficient documentation

## 2021-10-02 DIAGNOSIS — R251 Tremor, unspecified: Secondary | ICD-10-CM | POA: Insufficient documentation

## 2021-10-02 DIAGNOSIS — R413 Other amnesia: Secondary | ICD-10-CM | POA: Diagnosis not present

## 2021-10-02 DIAGNOSIS — R4189 Other symptoms and signs involving cognitive functions and awareness: Secondary | ICD-10-CM | POA: Insufficient documentation

## 2021-10-02 DIAGNOSIS — G969 Disorder of central nervous system, unspecified: Secondary | ICD-10-CM | POA: Insufficient documentation

## 2021-10-02 DIAGNOSIS — I613 Nontraumatic intracerebral hemorrhage in brain stem: Secondary | ICD-10-CM | POA: Diagnosis not present

## 2021-10-16 DIAGNOSIS — M79672 Pain in left foot: Secondary | ICD-10-CM | POA: Diagnosis not present

## 2021-10-16 DIAGNOSIS — M25562 Pain in left knee: Secondary | ICD-10-CM | POA: Diagnosis not present

## 2021-10-16 DIAGNOSIS — G8929 Other chronic pain: Secondary | ICD-10-CM | POA: Diagnosis not present

## 2021-10-17 ENCOUNTER — Encounter: Payer: Self-pay | Admitting: Physical Medicine & Rehabilitation

## 2021-10-17 ENCOUNTER — Encounter: Payer: Medicare PPO | Admitting: Physical Medicine & Rehabilitation

## 2021-10-17 VITALS — BP 133/88 | HR 64 | Ht 66.0 in | Wt 168.0 lb

## 2021-10-17 DIAGNOSIS — R4701 Aphasia: Secondary | ICD-10-CM | POA: Diagnosis not present

## 2021-10-17 DIAGNOSIS — F09 Unspecified mental disorder due to known physiological condition: Secondary | ICD-10-CM | POA: Diagnosis not present

## 2021-10-17 DIAGNOSIS — I613 Nontraumatic intracerebral hemorrhage in brain stem: Secondary | ICD-10-CM | POA: Diagnosis not present

## 2021-10-17 DIAGNOSIS — G969 Disorder of central nervous system, unspecified: Secondary | ICD-10-CM | POA: Diagnosis not present

## 2021-10-17 DIAGNOSIS — R251 Tremor, unspecified: Secondary | ICD-10-CM

## 2021-10-17 DIAGNOSIS — R4189 Other symptoms and signs involving cognitive functions and awareness: Secondary | ICD-10-CM

## 2021-10-17 DIAGNOSIS — R413 Other amnesia: Secondary | ICD-10-CM | POA: Diagnosis not present

## 2021-10-17 DIAGNOSIS — I6782 Cerebral ischemia: Secondary | ICD-10-CM | POA: Diagnosis not present

## 2021-10-17 DIAGNOSIS — I610 Nontraumatic intracerebral hemorrhage in hemisphere, subcortical: Secondary | ICD-10-CM | POA: Diagnosis not present

## 2021-10-17 NOTE — Progress Notes (Signed)
Subjective:    Patient ID: Shelby Hill, female    DOB: 1935/07/07, 86 y.o.   MRN: [id number]  HPI  Shelby Hill is here in follow-up of her left basal ganglia intracranial hemorrhage and associated hospitalization and rehab stay.  I last saw her in January.  She saw Dr. Sima Matas on April 20.  Findings were notable and consistent for subcortical microvascular ischemic occlusive disease and associated memory and cognitive deficits as result.  There are no signs of a progressive dementia such as Alzheimer's.  She is walking with a cane at home. She is dressing and bathing on her own. She toilets independent also. She is sleeping in her normal bed.   Sleep has improved quite a bit since she's been home. They actually stopped the seroquel d/t fatigue. Her mood has been much more steady per husband and they noticed general improvement in her cognition.Marland Kitchen   Her bowels and bladder are regular. She hasn't had many tremors. We have her on inderal '10mg'$  bid currently.    Pain Inventory Average Pain 5 Pain Right Now 5 My pain is dull  LOCATION OF PAIN  knee  BOWEL Number of stools per week: 4   BLADDER Normal    Mobility walk without assistance use a cane ability to climb steps?  yes do you drive?  no  Function retired  Neuro/Psych tremor trouble walking confusion  Prior Studies Any changes since last visit?  no  Physicians involved in your care Any changes since last visit?  no   Family History  Problem Relation Age of Onset   CAD Mother    Stroke Sister    Stroke Brother    Social History   Socioeconomic History   Marital status: Married    Spouse name: Not on file   Number of children: Not on file   Years of education: Not on file   Highest education level: Not on file  Occupational History   Not on file  Tobacco Use   Smoking status: Never   Smokeless tobacco: Never  Vaping Use   Vaping Use: Never used  Substance and Sexual Activity   Alcohol use:  Yes    Comment: Occasionally   Drug use: No   Sexual activity: Not Currently  Other Topics Concern   Not on file  Social History Narrative   ** Merged History Encounter **    Right handed   Caffeine~ 1-2 cups per day    Social Determinants of Health   Financial Resource Strain: Not on file  Food Insecurity: Not on file  Transportation Needs: Not on file  Physical Activity: Not on file  Stress: Not on file  Social Connections: Not on file   Past Surgical History:  Procedure Laterality Date   COLONOSCOPY  01/01/2012   Procedure: COLONOSCOPY;  Surgeon: Rogene Houston, MD;  [facility];  Service: Endoscopy;  Laterality: N/A;  830   INTRAMEDULLARY (IM) NAIL INTERTROCHANTERIC Left 04/26/2017   Procedure: INTRAMEDULLARY (IM) NAIL INTERTROCHANTRIC;  Surgeon: Leandrew Koyanagi, MD;  Location: [place];  Service: Orthopedics;  Laterality: Left;   KNEE SURGERY     ORIF TIBIA PLATEAU     Past Medical History:  Diagnosis Date   BCC (basal cell carcinoma) 02/09/1988   left upper thigh,left lower thigh tx cx3 70f   BCC (basal cell carcinoma) 10/04/1962   left clavicle,left breast, Back   BCC (basal cell carcinoma) 03/06/1989   mid back   BCC (basal  cell carcinoma) 04/02/1990   left upper shoulder (CX35FU),left upper breast(CX35FU),above left clavicle, right outer back,   BCC (basal cell carcinoma) 04/01/1991   right cetner outer brown (CX3+exc. ), upper left chest,upper left chest medial, right back   BCC (basal cell carcinoma) 02/01/1994   right back   BCC (basal cell carcinoma) 03/26/1995   central upper back (CX35FU),left upper back (CX35FU)   BCC (basal cell carcinoma) 12/30/1997   right cheek (exc. )   BCC (basal cell carcinoma) 08/06/1999   upper right shin (CX35FU), upper right shin (CX35FU)   BCC (basal cell carcinoma) 08/24/1998   left breast (CX35FU)   BCC (basal cell carcinoma) 08/05/2001   right inner cheek (MOHS), Left upper back (CX35FU),right upper back  (CX35FU),, right top hand (CX35FU)   BCC (basal cell carcinoma) 08/02/2002   upper back(CX35FU)   BCC (basal cell carcinoma) 09/12/2003   right mid back (CX35FU), right thigh (deep freeze+aldara) Left thigh outer (deep freeze +aldara)   BCC (basal cell carcinoma) 06/03/2005   rigth scapula (CX35FU),    BCC (basal cell carcinoma) 06/05/2006   right ant. 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 (CX35FU)   SCC (squamous cell carcinoma) 06/05/2006   mid chest (CX35FU)   SCC (squamous cell carcinoma) 08/08/2010   left temple (CX35FU), Left upper arm (CX35FU), right forearm/wrist (CX35FU)   SCC (squamous cell carcinoma) 02/06/2012   Left post neck-tx p bx   SCC (squamous cell carcinoma) 11/09/2012   left hand-tx p bx-, left chest sup -tx p bx, left chest inf. (CX35FU)   SCC (squamous cell carcinoma) 08/04/2014   left clavicle-tx p bx   SCC (squamous cell carcinoma) 11/30/2014   lower right leg distal (CX35FU), lower right leg, prox. (CX35FU)   SCC (squamous cell carcinoma) 05/17/2015   right jawline (CX35FU), Left sideburn (CX35FU)   SCC (squamous cell carcinoma) 09/10/2017   left forearm-tx p bx, left temple -tx p bx   BP 133/88   Pulse 64   Ht '5\' 6"'$  (1.676 m)   Wt 168 lb (76.2 kg)   SpO2 93%   BMI 27.12 kg/m   Opioid Risk Score:   Fall Risk Score:  `1  Depression screen [facility] 2/9     10/17/2021    1:48 PM 07/10/2021    1:11 PM   Depression screen PHQ 2/9  Decreased Interest 0 0  Down, Depressed, Hopeless 0 0  PHQ - 2 Score 0 0  Altered sleeping  0  Tired, decreased energy  0  Change in appetite  0  Feeling bad or failure about yourself   0  Trouble concentrating  1  Moving slowly or fidgety/restless  1  Suicidal thoughts  0  PHQ-9 Score  2  Difficult doing work/chores  Not difficult at all     Review of Systems  Constitutional: Negative.   HENT: Negative.    Eyes: Negative.  Respiratory: Negative.    Cardiovascular: Negative.   Gastrointestinal: Negative.   Endocrine: Negative.   Genitourinary: Negative.   Musculoskeletal:  Positive for gait problem.  Skin: Negative.   Allergic/Immunologic: Negative.   Neurological:  Positive for tremors.  Hematological: Negative.   Psychiatric/Behavioral:  Positive for confusion.       Objective:   Physical Exam  Gen: no distress, normal appearing HEENT: oral mucosa pink and moist, NCAT Cardio: Reg rate Chest: normal effort, normal rate of breathing Abd: soft, non-distended Ext: no edema Psych: pleasant, normal affect Skin: intact Neuro: Patient is alert and oriented to person as well as the doctors office in [place].  She was able to tell me the month with extra time but could not tell me the date or year of except with cues.  She remembered that it was Mother's Day recently but could not recall that [facility] Day was coming up.  Asked to recall 3 objects after 5 minutes and she remembered 2 out of 3.  She is able to recall some current events.  Continues to have some word finding deficits and paraphasias.  Definitely is more on point and does better when she is relaxed and not pressed for time.  Strength is grossly 4 out of 5 in all 4 limbs.  She did have some antalgia with weightbearing on the left leg.  She was much more steady on her feet with her straight cane. Musculoskeletal: Full ROM,  in the neck, trunk, or extremities. Posture appropriate.  She is  antalgic in the left lower extremity due to her knee.      Assessment & Plan:  1. Functional deficits secondary to IPH centered in left basal ganglia; likely secondary to hypertensive source  -She has made progress with her mobility as well as with her cognition and language although there are definite baseline deficits especially with memory and word finding still.  We discussed Dr. Ferne Coe findings as part of this visit as well today             -Made referrals to outpatient physical therapy and speech therapy at [facility] 2.  Antithrombotics: -DVT/anticoagulation:   none given IPH             -antiplatelet therapy: none 3. Pain Management: Tylenol PRN 4. Anxiety/sun-downing: Improved after going home.  Off all medications for sleep and anxiety   -Did review the importance of regular sleep as a way to maximize her mentation 8. Hypertension:   -Follow-up with primary but we can wean propranolol to off  9. Hyperlipidemia: continue Crestor 10: DM-2: diet controlled 11: Trigeminal neuralgia: continue Lamictal, duloxetine and Lyrica.  --Followed by [facility]. --pain remains controlled 12. Essential tremor: on bid propranolol with some improvement             -appears improved-will wean to off over a weeks time.  If she has any issues it certainly can be restarted  Thirty minutes of face to face patient care time were spent during this visit. All questions were encouraged and answered. Follow up with me in 3 mos.

## 2021-10-17 NOTE — Patient Instructions (Signed)
PLEASE FEEL FREE TO CALL OUR OFFICE WITH ANY PROBLEMS OR QUESTIONS ([phone])      

## 2021-10-23 ENCOUNTER — Ambulatory Visit: Payer: Medicare PPO | Admitting: Neurology

## 2021-10-25 ENCOUNTER — Encounter: Payer: Self-pay | Admitting: Psychology

## 2021-10-25 NOTE — Progress Notes (Signed)
10/02/2021: Today I provided feedback regarding the results of the recent neuropsychological evaluation to the patient and her family.  I have included the summary and impressions from the formal neuropsychological evaluation below for convenience.  It can be found in this entirety in the patient's EMR dated 08/13/2021.   Summary of Results:                        The results of the current neuropsychological evaluation related to objective cognitive assessment indicates significant fine motor control for both left and right hand without a significant difference between right and left hand on fine motor control or motor response speed.  This would not suggest significant lateralization of motor deficits.  The patient displayed significant expressive language deficits consistent with subjective reports including deficits with regard to lexical fluency as well as semantic fluency.  Significant reduction in global cognitive functioning essentially across the board with greatest deficits have to do with verbal and visual reasoning and problem-solving, information processing speed and visual spatial/visual constructional capacity.  While the patient did better relatively speaking with regard to working memory and especially well-maintained visual working memory, all cognitive domains were significantly below predicted levels of premorbid function with the exception of visual encoding capacity.  Memory functions were also significantly impaired with greater memory deficits for visual versus auditory functioning.  Given the fact that the patient's visual encoding was generally well-preserved these visual memory deficits were quite striking.  However, in-depth analysis of memory testing results suggest that the primary deficit for her memory deficits had to do with retrieval of visual memory which was also somewhat suggested for auditory memory and learning.  The patient clearly has significant memory deficits but she  appears to have adequate to well-preserved capacity for auditory and visual encoding capacities respectively and that the most significant aspect of her memory deficits have to do with retrieval of previously learned information.  The information that does initially get stored and organized is available for later recall for both visual and auditory information.   Impression/Diagnosis:                     Overall, the results of the current neuropsychological evaluation are consistent with significant chronic and more recent acute cerebrovascular events.  The patient is showing particular deficits consistent with widespread subcortical white matter involvement with no particular lateralization findings.  While the patient had an acute event leading to her hospitalization MRI and CT scans results suggest that there were also some chronic microvascular events and significant microvascular ischemic disease present including indications of chronic microvascular hemorrhages previous to her most recent acute cerebrovascular bleed.  The pattern of strengths and weaknesses and course of cognitive change described by her and her family are not consistent with progressive dementia such as Alzheimer's, Lewy body or other cortical progressive dementia's.  The patient's history, brain imaging studies and cognitive testing are consistent with both longstanding and ongoing cerebrovascular disease as well as at least 1 significant bleed more recently.  There is not indications of strong lateralization of deficits on cognitive measures and her deficits clearly are much more related to subcortical white matter involvement as the primary culprit for many and most of her deficits.  As far as diagnostic considerations this clearly is related to significant cerebrovascular disease that has been progressing and worsening over time.  It was postulated by her radiologist that some of the findings could be consistent with  cerebral amyloid  angiopathic process that while it may be mild in nature could account for the microvascular events along with small vessel disease.  There is a family history of significant cerebrovascular events which would also be consistent with this finding.  In any event, the patient's current cognitive and motor functions are likely related to ongoing cerebrovascular disease.  As far as treatment recommendations, the patient minimizes and has clear issues with awareness of her cognitive and motor deficits.  She is refusing or resistant to using her cane or other assistive devices which poses an increased risk of fall.  The patient's level of retrieval deficits would suggest significant risk if attempting to do basic ADLs around the home.  She has family assistance from both her husband and her oldest son and has been receiving assistance at other times through CNAs.  There continues to be in need for consistent assistance from others particular around safety issues both related to her motor deficits as well as her significant cognitive deficits that are described above.  I will sit down with the patient and go over the results of the current neuropsychological evaluation and make sure these results are available in her EMR for her treatment teams including Dr. Naaman Plummer and her providers with [facility].   Diagnosis:                                Nontraumatic intracerebral hemorrhage in brainstem, unspecified laterality (HCC)   Memory loss   Cognitive and neurobehavioral dysfunction   Subcortical microvascular ischemic occlusive disease     _____________________ Ilean Skill, Psy.D. Clinical Neuropsychologist

## 2021-12-06 DIAGNOSIS — M79672 Pain in left foot: Secondary | ICD-10-CM | POA: Diagnosis not present

## 2021-12-06 DIAGNOSIS — M25562 Pain in left knee: Secondary | ICD-10-CM | POA: Diagnosis not present

## 2021-12-06 DIAGNOSIS — G8929 Other chronic pain: Secondary | ICD-10-CM | POA: Diagnosis not present

## 2021-12-13 ENCOUNTER — Other Ambulatory Visit: Payer: Self-pay | Admitting: Neurology

## 2022-01-08 DIAGNOSIS — J9691 Respiratory failure, unspecified with hypoxia: Secondary | ICD-10-CM | POA: Diagnosis not present

## 2022-01-08 DIAGNOSIS — G935 Compression of brain: Secondary | ICD-10-CM | POA: Diagnosis not present

## 2022-01-08 DIAGNOSIS — I611 Nontraumatic intracerebral hemorrhage in hemisphere, cortical: Secondary | ICD-10-CM | POA: Diagnosis not present

## 2022-01-08 DIAGNOSIS — R918 Other nonspecific abnormal finding of lung field: Secondary | ICD-10-CM | POA: Diagnosis not present

## 2022-01-08 DIAGNOSIS — S065XAS Traumatic subdural hemorrhage with loss of consciousness status unknown, sequela: Secondary | ICD-10-CM | POA: Diagnosis not present

## 2022-01-08 DIAGNOSIS — F419 Anxiety disorder, unspecified: Secondary | ICD-10-CM | POA: Diagnosis not present

## 2022-01-08 DIAGNOSIS — I619 Nontraumatic intracerebral hemorrhage, unspecified: Secondary | ICD-10-CM | POA: Diagnosis not present

## 2022-01-08 DIAGNOSIS — J810 Acute pulmonary edema: Secondary | ICD-10-CM | POA: Diagnosis not present

## 2022-01-08 DIAGNOSIS — B961 Klebsiella pneumoniae [K. pneumoniae] as the cause of diseases classified elsewhere: Secondary | ICD-10-CM | POA: Diagnosis not present

## 2022-01-08 DIAGNOSIS — R0689 Other abnormalities of breathing: Secondary | ICD-10-CM | POA: Diagnosis not present

## 2022-01-08 DIAGNOSIS — Z66 Do not resuscitate: Secondary | ICD-10-CM | POA: Diagnosis not present

## 2022-01-08 DIAGNOSIS — R1111 Vomiting without nausea: Secondary | ICD-10-CM | POA: Diagnosis not present

## 2022-01-08 DIAGNOSIS — I2510 Atherosclerotic heart disease of native coronary artery without angina pectoris: Secondary | ICD-10-CM | POA: Diagnosis not present

## 2022-01-08 DIAGNOSIS — R0902 Hypoxemia: Secondary | ICD-10-CM | POA: Diagnosis not present

## 2022-01-08 DIAGNOSIS — Z79899 Other long term (current) drug therapy: Secondary | ICD-10-CM | POA: Diagnosis not present

## 2022-01-08 DIAGNOSIS — R519 Headache, unspecified: Secondary | ICD-10-CM | POA: Diagnosis not present

## 2022-01-08 DIAGNOSIS — S065XAA Traumatic subdural hemorrhage with loss of consciousness status unknown, initial encounter: Secondary | ICD-10-CM | POA: Diagnosis not present

## 2022-01-08 DIAGNOSIS — G250 Essential tremor: Secondary | ICD-10-CM | POA: Diagnosis not present

## 2022-01-08 DIAGNOSIS — G9340 Encephalopathy, unspecified: Secondary | ICD-10-CM | POA: Diagnosis not present

## 2022-01-08 DIAGNOSIS — F03918 Unspecified dementia, unspecified severity, with other behavioral disturbance: Secondary | ICD-10-CM | POA: Diagnosis not present

## 2022-01-08 DIAGNOSIS — I10 Essential (primary) hypertension: Secondary | ICD-10-CM | POA: Diagnosis not present

## 2022-01-08 DIAGNOSIS — J9601 Acute respiratory failure with hypoxia: Secondary | ICD-10-CM | POA: Diagnosis not present

## 2022-01-08 DIAGNOSIS — R471 Dysarthria and anarthria: Secondary | ICD-10-CM | POA: Diagnosis not present

## 2022-01-08 DIAGNOSIS — I618 Other nontraumatic intracerebral hemorrhage: Secondary | ICD-10-CM | POA: Diagnosis not present

## 2022-01-08 DIAGNOSIS — Z20822 Contact with and (suspected) exposure to covid-19: Secondary | ICD-10-CM | POA: Diagnosis not present

## 2022-01-08 DIAGNOSIS — I612 Nontraumatic intracerebral hemorrhage in hemisphere, unspecified: Secondary | ICD-10-CM | POA: Diagnosis not present

## 2022-01-08 DIAGNOSIS — Z9911 Dependence on respirator [ventilator] status: Secondary | ICD-10-CM | POA: Diagnosis not present

## 2022-01-08 DIAGNOSIS — Z7189 Other specified counseling: Secondary | ICD-10-CM | POA: Diagnosis not present

## 2022-01-08 DIAGNOSIS — R410 Disorientation, unspecified: Secondary | ICD-10-CM | POA: Diagnosis not present

## 2022-01-08 DIAGNOSIS — G936 Cerebral edema: Secondary | ICD-10-CM | POA: Diagnosis not present

## 2022-01-08 DIAGNOSIS — R000 Tachycardia, unspecified: Secondary | ICD-10-CM | POA: Diagnosis not present

## 2022-01-08 DIAGNOSIS — Z515 Encounter for palliative care: Secondary | ICD-10-CM | POA: Diagnosis not present

## 2022-01-08 DIAGNOSIS — F418 Other specified anxiety disorders: Secondary | ICD-10-CM | POA: Diagnosis not present

## 2022-01-08 DIAGNOSIS — E785 Hyperlipidemia, unspecified: Secondary | ICD-10-CM | POA: Diagnosis not present

## 2022-01-08 DIAGNOSIS — J150 Pneumonia due to Klebsiella pneumoniae: Secondary | ICD-10-CM | POA: Diagnosis not present

## 2022-01-08 DIAGNOSIS — R7303 Prediabetes: Secondary | ICD-10-CM | POA: Diagnosis not present

## 2022-01-08 DIAGNOSIS — G4489 Other headache syndrome: Secondary | ICD-10-CM | POA: Diagnosis not present

## 2022-01-08 DIAGNOSIS — Z781 Physical restraint status: Secondary | ICD-10-CM | POA: Diagnosis not present

## 2022-01-08 DIAGNOSIS — F32A Depression, unspecified: Secondary | ICD-10-CM | POA: Diagnosis not present

## 2022-01-08 DIAGNOSIS — G500 Trigeminal neuralgia: Secondary | ICD-10-CM | POA: Diagnosis not present

## 2022-01-08 DIAGNOSIS — J90 Pleural effusion, not elsewhere classified: Secondary | ICD-10-CM | POA: Diagnosis not present

## 2022-01-08 DIAGNOSIS — Z7982 Long term (current) use of aspirin: Secondary | ICD-10-CM | POA: Diagnosis not present

## 2022-01-08 DIAGNOSIS — J9811 Atelectasis: Secondary | ICD-10-CM | POA: Diagnosis not present

## 2022-01-08 DIAGNOSIS — Z885 Allergy status to narcotic agent status: Secondary | ICD-10-CM | POA: Diagnosis not present

## 2022-01-08 DIAGNOSIS — R1310 Dysphagia, unspecified: Secondary | ICD-10-CM | POA: Diagnosis not present

## 2022-01-08 DIAGNOSIS — J690 Pneumonitis due to inhalation of food and vomit: Secondary | ICD-10-CM | POA: Diagnosis not present

## 2022-01-08 DIAGNOSIS — R7989 Other specified abnormal findings of blood chemistry: Secondary | ICD-10-CM | POA: Diagnosis not present

## 2022-01-08 DIAGNOSIS — J9600 Acute respiratory failure, unspecified whether with hypoxia or hypercapnia: Secondary | ICD-10-CM | POA: Diagnosis not present

## 2022-01-29 ENCOUNTER — Telehealth: Payer: Self-pay | Admitting: Neurology

## 2022-01-29 NOTE — Telephone Encounter (Signed)
Pt's husband called to cancel appt due to patient has passed away.  Offered Mr. Grider our condolences

## 2022-01-30 ENCOUNTER — Ambulatory Visit: Payer: Medicare PPO | Admitting: Physical Medicine & Rehabilitation

## 2022-02-06 ENCOUNTER — Ambulatory Visit: Payer: Medicare PPO | Admitting: Neurology

## 2022-02-24 DEATH — deceased
